# Patient Record
Sex: Male | Born: 1950 | Race: White | Hispanic: No | Marital: Married | Smoking: Never smoker
Health system: Southern US, Community
[De-identification: ages and names within clinical notes are randomized; demographics above are authoritative.]

## PROBLEM LIST (undated history)

## (undated) DIAGNOSIS — Z9189 Other specified personal risk factors, not elsewhere classified: Secondary | ICD-10-CM

## (undated) DIAGNOSIS — I5042 Chronic combined systolic (congestive) and diastolic (congestive) heart failure: Secondary | ICD-10-CM

## (undated) DIAGNOSIS — I10 Essential (primary) hypertension: Secondary | ICD-10-CM

## (undated) DIAGNOSIS — K219 Gastro-esophageal reflux disease without esophagitis: Secondary | ICD-10-CM

## (undated) DIAGNOSIS — I359 Nonrheumatic aortic valve disorder, unspecified: Secondary | ICD-10-CM

## (undated) DIAGNOSIS — I420 Dilated cardiomyopathy: Secondary | ICD-10-CM

## (undated) DIAGNOSIS — M87051 Idiopathic aseptic necrosis of right femur: Secondary | ICD-10-CM

## (undated) DIAGNOSIS — I77810 Thoracic aortic ectasia: Secondary | ICD-10-CM

## (undated) HISTORY — DX: Dilated cardiomyopathy: I42.0

## (undated) HISTORY — DX: Thoracic aortic ectasia: I77.810

## (undated) HISTORY — DX: Idiopathic aseptic necrosis of right femur: M87.051

## (undated) HISTORY — DX: Other specified personal risk factors, not elsewhere classified: Z91.89

## (undated) HISTORY — DX: Chronic combined systolic (congestive) and diastolic (congestive) heart failure: I50.42

## (undated) HISTORY — DX: Nonrheumatic aortic valve disorder, unspecified: I35.9

## (undated) HISTORY — DX: Essential (primary) hypertension: I10

---

## 1999-08-23 ENCOUNTER — Emergency Department (HOSPITAL_COMMUNITY): Admission: EM | Admit: 1999-08-23 | Discharge: 1999-08-23 | Payer: Self-pay | Admitting: Emergency Medicine

## 1999-11-17 ENCOUNTER — Encounter: Payer: Self-pay | Admitting: Internal Medicine

## 1999-11-17 ENCOUNTER — Inpatient Hospital Stay (HOSPITAL_COMMUNITY): Admission: EM | Admit: 1999-11-17 | Discharge: 1999-12-03 | Payer: Self-pay | Admitting: Emergency Medicine

## 1999-11-19 ENCOUNTER — Encounter: Payer: Self-pay | Admitting: Internal Medicine

## 1999-11-20 ENCOUNTER — Encounter: Payer: Self-pay | Admitting: Internal Medicine

## 1999-11-20 HISTORY — PX: ENDOVASCULAR REPAIR OF POPLITEAL ARTERY ANEURYSM: SHX5811

## 1999-11-23 ENCOUNTER — Encounter: Payer: Self-pay | Admitting: Internal Medicine

## 1999-12-17 ENCOUNTER — Inpatient Hospital Stay (HOSPITAL_COMMUNITY)
Admission: AD | Admit: 1999-12-17 | Discharge: 2000-01-11 | Payer: Self-pay | Admitting: Thoracic Surgery (Cardiothoracic Vascular Surgery)

## 1999-12-17 ENCOUNTER — Encounter: Payer: Self-pay | Admitting: Vascular Surgery

## 1999-12-20 HISTORY — PX: OTHER SURGICAL HISTORY: SHX169

## 1999-12-27 ENCOUNTER — Encounter: Payer: Self-pay | Admitting: Cardiology

## 2000-01-01 ENCOUNTER — Encounter: Payer: Self-pay | Admitting: Cardiothoracic Surgery

## 2000-01-03 ENCOUNTER — Encounter: Payer: Self-pay | Admitting: Thoracic Surgery (Cardiothoracic Vascular Surgery)

## 2000-01-04 ENCOUNTER — Encounter: Payer: Self-pay | Admitting: Thoracic Surgery (Cardiothoracic Vascular Surgery)

## 2000-01-05 ENCOUNTER — Encounter: Payer: Self-pay | Admitting: Thoracic Surgery (Cardiothoracic Vascular Surgery)

## 2000-01-06 ENCOUNTER — Encounter: Payer: Self-pay | Admitting: Thoracic Surgery (Cardiothoracic Vascular Surgery)

## 2003-01-29 ENCOUNTER — Ambulatory Visit (HOSPITAL_COMMUNITY)
Admission: RE | Admit: 2003-01-29 | Discharge: 2003-01-29 | Payer: Self-pay | Admitting: Thoracic Surgery (Cardiothoracic Vascular Surgery)

## 2004-02-05 ENCOUNTER — Ambulatory Visit (HOSPITAL_COMMUNITY)
Admission: RE | Admit: 2004-02-05 | Discharge: 2004-02-05 | Payer: Self-pay | Admitting: Thoracic Surgery (Cardiothoracic Vascular Surgery)

## 2005-03-02 ENCOUNTER — Ambulatory Visit (HOSPITAL_COMMUNITY)
Admission: RE | Admit: 2005-03-02 | Discharge: 2005-03-02 | Payer: Self-pay | Admitting: Thoracic Surgery (Cardiothoracic Vascular Surgery)

## 2013-02-12 ENCOUNTER — Other Ambulatory Visit: Payer: Self-pay | Admitting: Cardiology

## 2013-06-18 ENCOUNTER — Other Ambulatory Visit: Payer: Self-pay | Admitting: *Deleted

## 2013-06-18 MED ORDER — METOPROLOL TARTRATE 100 MG PO TABS: ORAL_TABLET | ORAL | Status: DC

## 2013-08-09 ENCOUNTER — Other Ambulatory Visit: Payer: Self-pay

## 2013-08-09 MED ORDER — METOPROLOL TARTRATE 100 MG PO TABS: ORAL_TABLET | ORAL | Status: DC

## 2013-09-03 ENCOUNTER — Encounter: Payer: Self-pay | Admitting: General Surgery

## 2013-09-03 DIAGNOSIS — Z954 Presence of other heart-valve replacement: Secondary | ICD-10-CM | POA: Insufficient documentation

## 2013-09-03 DIAGNOSIS — I359 Nonrheumatic aortic valve disorder, unspecified: Secondary | ICD-10-CM

## 2013-09-03 DIAGNOSIS — I10 Essential (primary) hypertension: Secondary | ICD-10-CM | POA: Insufficient documentation

## 2013-09-03 HISTORY — DX: Nonrheumatic aortic valve disorder, unspecified: I35.9

## 2013-09-11 ENCOUNTER — Encounter: Payer: Self-pay | Admitting: Cardiology

## 2013-09-11 ENCOUNTER — Ambulatory Visit (INDEPENDENT_AMBULATORY_CARE_PROVIDER_SITE_OTHER): Payer: BC Managed Care – PPO | Admitting: Cardiology

## 2013-09-11 VITALS — BP 130/58 | HR 64 | Ht 61.0 in | Wt 136.0 lb

## 2013-09-11 DIAGNOSIS — Z954 Presence of other heart-valve replacement: Secondary | ICD-10-CM

## 2013-09-11 DIAGNOSIS — I10 Essential (primary) hypertension: Secondary | ICD-10-CM

## 2013-09-11 DIAGNOSIS — I359 Nonrheumatic aortic valve disorder, unspecified: Secondary | ICD-10-CM

## 2013-09-11 NOTE — Progress Notes (Signed)
  [address], [address] [place], [place]  [address] Phone: [phone] Fax:  [phone]  Date:  09/11/2013   ID:  Dale Sanford, DOB 17-Oct-1950, MRN [id number]  PCP:  Mickie Hillier, MD  Cardiologist:  Armanda Magic, MD     History of Present Illness: Dale Sanford is a 63 y.o. 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  He denies any chest pain, SOB, DOE, LE edema, dizziness, palpitations or syncope.  He occasionally has some heartburn.     Wt Readings from Last 3 Encounters:  09/11/13 136 lb (61.689 kg)     Past Medical History  Diagnosis Date  . Endocarditis 11/1999    Aortic Valve w AI S/P AVR autograft w pulmonic tissue valve (Ross Procedure) 12/1999  . Poor dental hygiene   . Hypertension     Current Outpatient Prescriptions  Medication Sig Dispense Refill  . aspirin 325 MG tablet Take 325 mg by mouth daily.      . metoprolol (LOPRESSOR) 100 MG tablet TAKE 1 TABLET TWICE A DAY  180 tablet  0   No current facility-administered medications for this visit.    Allergies:    Allergies  Allergen Reactions  . Nyquil Multi-Symptom [Pseudoeph-Doxylamine-Dm-Apap]     Social History:  The patient  reports that he has never smoked. He does not have any smokeless tobacco history on file. He reports that he does not drink alcohol.   Family History:  The patient's family history is not on file.   ROS:  Please see the history of present illness.      All other systems reviewed and negative.   PHYSICAL EXAM: VS:  BP 130/58  Pulse 64  Ht 5\' 1"  (1.549 m)  Wt 136 lb (61.689 kg)  BMI 25.71 kg/m2 Well nourished, well developed, in no acute distress HEENT: normal Neck: no JVD Cardiac:  normal S1, S2; RRR; no murmur Lungs:  clear to auscultation bilaterally, no wheezing, rhonchi or rales Abd:  soft, nontender, no hepatomegaly Ext: no edema Skin: warm and dry Neuro:  CNs 2-12 intact, no focal abnormalities noted  EKG:   NSR a5 64bpm with ILBBB, LVH by voltage and T wave inversions in III and aVF, biphasic T wave in II - no change from EKG 09/12/2012  ASSESSMENT AND PLAN: 1.  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  HTN- controlled -  Continue metoprolol  Followup with me in 1 year   Signed, Armanda Magic, MD 09/11/2013 9:57 AM

## 2013-09-11 NOTE — Patient Instructions (Signed)
Your physician recommends that you continue on your current medications as directed. Please refer to the Current Medication list given to you today.  Your physician has requested that you have an echocardiogram. Echocardiography is a painless test that uses sound waves to create images of your heart. It provides your doctor with information about the size and shape of your heart and how well your heart's chambers and valves are working. This procedure takes approximately one hour. There are no restrictions for this procedure.  Your physician wants you to follow-up in: 1 year with Dr. Turner.  You will receive a reminder letter in the mail two months in advance. If you don't receive a letter, please call our office to schedule the follow-up appointment.  

## 2013-09-16 ENCOUNTER — Encounter: Payer: Self-pay | Admitting: Cardiology

## 2013-10-01 ENCOUNTER — Ambulatory Visit (HOSPITAL_COMMUNITY): Payer: BC Managed Care – PPO | Attending: Cardiology | Admitting: Radiology

## 2013-10-01 DIAGNOSIS — I079 Rheumatic tricuspid valve disease, unspecified: Secondary | ICD-10-CM | POA: Insufficient documentation

## 2013-10-01 DIAGNOSIS — I059 Rheumatic mitral valve disease, unspecified: Secondary | ICD-10-CM | POA: Insufficient documentation

## 2013-10-01 DIAGNOSIS — I38 Endocarditis, valve unspecified: Secondary | ICD-10-CM | POA: Insufficient documentation

## 2013-10-01 DIAGNOSIS — Z954 Presence of other heart-valve replacement: Secondary | ICD-10-CM

## 2013-10-01 DIAGNOSIS — I10 Essential (primary) hypertension: Secondary | ICD-10-CM | POA: Insufficient documentation

## 2013-10-01 DIAGNOSIS — I517 Cardiomegaly: Secondary | ICD-10-CM | POA: Insufficient documentation

## 2013-10-01 DIAGNOSIS — I359 Nonrheumatic aortic valve disorder, unspecified: Secondary | ICD-10-CM

## 2013-10-01 NOTE — Progress Notes (Signed)
Echocardiogram performed.  

## 2013-10-15 ENCOUNTER — Encounter: Payer: Self-pay | Admitting: General Surgery

## 2013-10-15 ENCOUNTER — Other Ambulatory Visit: Payer: Self-pay | Admitting: General Surgery

## 2013-10-15 DIAGNOSIS — I77810 Thoracic aortic ectasia: Secondary | ICD-10-CM

## 2013-12-25 ENCOUNTER — Other Ambulatory Visit: Payer: Self-pay | Admitting: Cardiology

## 2014-07-09 ENCOUNTER — Encounter: Payer: Self-pay | Admitting: Cardiology

## 2015-02-04 ENCOUNTER — Other Ambulatory Visit: Payer: Self-pay | Admitting: Cardiology

## 2015-04-27 ENCOUNTER — Emergency Department (HOSPITAL_COMMUNITY): Payer: BLUE CROSS/BLUE SHIELD

## 2015-04-27 ENCOUNTER — Emergency Department (HOSPITAL_COMMUNITY)
Admission: EM | Admit: 2015-04-27 | Discharge: 2015-04-28 | Disposition: A | Discharge status: Home / Self Care | Payer: BLUE CROSS/BLUE SHIELD | Attending: Emergency Medicine | Admitting: Emergency Medicine

## 2015-04-27 ENCOUNTER — Encounter (HOSPITAL_COMMUNITY): Payer: Self-pay | Admitting: Cardiology

## 2015-04-27 DIAGNOSIS — Z79899 Other long term (current) drug therapy: Secondary | ICD-10-CM | POA: Diagnosis not present

## 2015-04-27 DIAGNOSIS — I10 Essential (primary) hypertension: Secondary | ICD-10-CM | POA: Insufficient documentation

## 2015-04-27 DIAGNOSIS — Z7982 Long term (current) use of aspirin: Secondary | ICD-10-CM | POA: Insufficient documentation

## 2015-04-27 DIAGNOSIS — R112 Nausea with vomiting, unspecified: Secondary | ICD-10-CM | POA: Diagnosis present

## 2015-04-27 DIAGNOSIS — R109 Unspecified abdominal pain: Secondary | ICD-10-CM | POA: Insufficient documentation

## 2015-04-27 DIAGNOSIS — R197 Diarrhea, unspecified: Secondary | ICD-10-CM | POA: Insufficient documentation

## 2015-04-27 LAB — COMPREHENSIVE METABOLIC PANEL
ALBUMIN: 4.4 g/dL (ref 3.5–5.0)
ALK PHOS: 66 U/L (ref 38–126)
ALT: 16 U/L — AB (ref 17–63)
AST: 18 U/L (ref 15–41)
Anion gap: 15 (ref 5–15)
BUN: 17 mg/dL (ref 6–20)
CALCIUM: 9 mg/dL (ref 8.9–10.3)
CO2: 26 mmol/L (ref 22–32)
Chloride: 100 mmol/L — ABNORMAL LOW (ref 101–111)
Creatinine, Ser: 1.43 mg/dL — ABNORMAL HIGH (ref 0.61–1.24)
GFR calc Af Amer: 57 mL/min — ABNORMAL LOW (ref >60)
GFR calc non Af Amer: 50 mL/min — ABNORMAL LOW (ref >60)
GLUCOSE: 176 mg/dL — AB (ref 65–99)
Potassium: 5 mmol/L (ref 3.5–5.1)
Sodium: 141 mmol/L (ref 135–145)
Total Bilirubin: 0.9 mg/dL (ref 0.3–1.2)
Total Protein: 7.3 g/dL (ref 6.5–8.1)

## 2015-04-27 LAB — CBC
HCT: 48.4 % (ref 39.0–52.0)
Hemoglobin: 16.6 g/dL (ref 13.0–17.0)
MCH: 28.6 pg (ref 26.0–34.0)
MCHC: 34.3 g/dL (ref 30.0–36.0)
MCV: 83.4 fL (ref 78.0–100.0)
PLATELETS: 275 10*3/uL (ref 150–400)
RBC: 5.8 MIL/uL (ref 4.22–5.81)
RDW: 12.7 % (ref 11.5–15.5)
WBC: 21.6 10*3/uL — AB (ref 4.0–10.5)

## 2015-04-27 LAB — DIFFERENTIAL
BASOS ABS: 0 10*3/uL (ref 0.0–0.1)
BASOS PCT: 0 %
EOS ABS: 0 10*3/uL (ref 0.0–0.7)
Eosinophils Relative: 0 %
Lymphocytes Relative: 8 %
Lymphs Abs: 1.2 10*3/uL (ref 0.7–4.0)
MONO ABS: 0.7 10*3/uL (ref 0.1–1.0)
Monocytes Relative: 4 %
NEUTROS ABS: 14 10*3/uL — AB (ref 1.7–7.7)
Neutrophils Relative %: 88 %

## 2015-04-27 LAB — LIPASE, BLOOD: Lipase: 40 U/L (ref 11–51)

## 2015-04-27 MED ORDER — ONDANSETRON 4 MG PO TBDP
4.0000 mg | ORAL_TABLET | Freq: Once | ORAL | Status: AC
  Administered 2015-04-27: 4 mg via ORAL
  Filled 2015-04-27: qty 1

## 2015-04-27 MED ORDER — SODIUM CHLORIDE 0.9 % IV BOLUS (SEPSIS)
1000.0000 mL | Freq: Once | INTRAVENOUS | Status: AC
  Administered 2015-04-27: 1000 mL via INTRAVENOUS

## 2015-04-27 NOTE — ED Notes (Signed)
Pt he had a couple of episodes of vomiting last night. States he has had sick contacts at home.

## 2015-04-27 NOTE — ED Provider Notes (Signed)
CSN: [id number]     Arrival date & time 04/27/15  1340 History   First MD Initiated Contact with Patient 04/27/15 2056     Chief Complaint  Patient presents with  . Emesis     (Consider location/radiation/quality/duration/timing/severity/associated sxs/prior Treatment) Patient is a 65 y.o. male presenting with GI illness. The history is provided by the patient.  GI Problem This is a new problem. The current episode started yesterday. The problem occurs 2 to 4 times per day. The problem has been resolved. Associated symptoms include nausea and vomiting. Pertinent negatives include no abdominal pain, chest pain, chills, coughing, fever or urinary symptoms. Nothing aggravates the symptoms. He has tried nothing for the symptoms.    Past Medical History  Diagnosis Date  . Endocarditis 11/1999    Aortic Valve w AI S/P AVR autograft w pulmonic tissue valve (Ross Procedure) 12/1999  . Poor dental hygiene   . Hypertension    Past Surgical History  Procedure Laterality Date  . Descending aortic resesection, replacement  12/1999    Hemashield Graft  . 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 No pertinent family history. Social History  Substance Use Topics  . Smoking status: Never Smoker   . Smokeless tobacco: None  . Alcohol Use: No     Comment: quit in 2008    Review of Systems  Constitutional: Negative for fever and chills.  HENT: Negative.   Eyes: Negative for visual disturbance.  Respiratory: Negative for cough and shortness of breath.   Cardiovascular: Negative for chest pain.  Gastrointestinal: Positive for nausea, vomiting and diarrhea. Negative for abdominal pain.  Genitourinary: Negative.   Musculoskeletal: Negative.   Skin: Negative.   Neurological: Negative.       Allergies  Nyquil multi-symptom  Home Medications   Prior to Admission medications   Medication Sig Start Date End Date Taking? Authorizing  Provider  aspirin 325 MG tablet Take 325 mg by mouth daily.   Yes Historical Provider, MD  metoprolol (LOPRESSOR) 100 MG tablet TAKE 1 TABLET TWICE A DAY 12/26/13  Yes Quintella Reichert, MD  naproxen sodium (ANAPROX) 220 MG tablet Take 220 mg by mouth daily as needed (for back pain).   Yes Historical Provider, MD  diphenoxylate-atropine (LOMOTIL) 2.5-0.025 MG tablet Take 1 tablet by mouth 4 (four) times daily as needed for diarrhea or loose stools. 04/28/15   Gilda Crease, MD  ondansetron (ZOFRAN) 4 MG tablet Take 1 tablet (4 mg total) by mouth every 6 (six) hours. 04/28/15   Gilda Crease, MD   BP 142/65 mmHg  Pulse 92  Temp(Src) 99.4 F (37.4 C) (Oral)  Resp 20  SpO2 99% Physical Exam  Constitutional: He is oriented to person, place, and time. He appears well-developed and well-nourished. No distress.  HENT:  Head: Normocephalic and atraumatic.  Eyes: EOM are normal. Pupils are equal, round, and reactive to light.  Neck: Normal range of motion. Neck supple.  Cardiovascular: Normal rate, regular rhythm, normal heart sounds and intact distal pulses.   Pulmonary/Chest: Effort normal and breath sounds normal. No respiratory distress. He exhibits no tenderness.  Abdominal: Soft. He exhibits no distension. There is no tenderness. There is no rebound and no guarding.  Musculoskeletal: Normal range of motion. He exhibits no edema or tenderness.  Neurological: He is alert and oriented to person, place, and time. No cranial nerve deficit. He exhibits normal muscle tone. Coordination normal.  Skin: Skin is  warm and dry. No rash noted. He is not diaphoretic. No erythema. No pallor.  Psychiatric: He has a normal mood and affect.  Nursing note and vitals reviewed.   ED Course  Procedures (including critical care time) Labs Review Labs Reviewed  COMPREHENSIVE METABOLIC PANEL - Abnormal; Notable for the following:    Chloride 100 (*)    Glucose, Bld 176 (*)    Creatinine, Ser 1.43 (*)     ALT 16 (*)    GFR calc non Af Amer 50 (*)    GFR calc Af Amer 57 (*)    All other components within normal limits  CBC - Abnormal; Notable for the following:    WBC 21.6 (*)    All other components within normal limits  DIFFERENTIAL - Abnormal; Notable for the following:    Neutro Abs 14.0 (*)    All other components within normal limits  URINALYSIS, ROUTINE W REFLEX MICROSCOPIC (NOT AT [facility]) - Abnormal; Notable for the following:    Bilirubin Urine SMALL (*)    All other components within normal limits  LIPASE, BLOOD    Imaging Review Dg Chest 2 View  04/27/2015  CLINICAL DATA:  Acute onset of leukocytosis and vomiting. Initial encounter. EXAM: CHEST  2 VIEW COMPARISON:  None. FINDINGS: The lungs are well-aerated and clear. There is no evidence of focal opacification, pleural effusion or pneumothorax. The heart is normal in size; the patient is status post median sternotomy. No acute osseous abnormalities are seen. IMPRESSION: No acute cardiopulmonary process seen. Electronically Signed   By: Roanna Raider M.D.   On: 04/27/2015 23:40  I have personally reviewed and evaluated these images and lab results as part of my medical decision-making.   EKG Interpretation None      MDM   Final diagnoses:  Nausea and vomiting, vomiting of unspecified type    Pt is 64yoM here with 2-3 episodes of nbnb vomiting over the night and now with diarrhea.  Symptoms largely resolved at the time of evaluation today. Pt lives with step daughter and her two kids, both of which of GI illness. Symptoms started after eating pizza. No fever or blood in diarrhea. Further history and exam as above. VSS and benign abdomen. Found to have significant leukocytosis. Will obtain ct a/p. Symptomatically treated here. Lipase neg and cmp reassuring. ua neg  Pt care headed off to Dr. Blinda Leatherwood at 1:30am. Please f/u on ct a/p and re-evaluate pt.    Marijean Niemann, MD 04/28/15 4098  Richardean Canal, MD 04/28/15 [phone]

## 2015-04-27 NOTE — ED Notes (Signed)
Pt given water for PO challenge,

## 2015-04-28 ENCOUNTER — Emergency Department (HOSPITAL_COMMUNITY): Payer: BLUE CROSS/BLUE SHIELD

## 2015-04-28 LAB — URINALYSIS, ROUTINE W REFLEX MICROSCOPIC
GLUCOSE, UA: NEGATIVE mg/dL
HGB URINE DIPSTICK: NEGATIVE
Ketones, ur: NEGATIVE mg/dL
LEUKOCYTES UA: NEGATIVE
NITRITE: NEGATIVE
PH: 5 (ref 5.0–8.0)
Protein, ur: NEGATIVE mg/dL
SPECIFIC GRAVITY, URINE: 1.02 (ref 1.005–1.030)

## 2015-04-28 MED ORDER — IOHEXOL 300 MG/ML  SOLN
80.0000 mL | Freq: Once | INTRAMUSCULAR | Status: AC | PRN
  Administered 2015-04-28: 100 mL via INTRAVENOUS

## 2015-04-28 MED ORDER — DIPHENOXYLATE-ATROPINE 2.5-0.025 MG PO TABS: 1.0000 | ORAL_TABLET | Freq: Four times a day (QID) | ORAL | Status: DC | PRN

## 2015-04-28 MED ORDER — ONDANSETRON HCL 4 MG PO TABS: 4.0000 mg | ORAL_TABLET | Freq: Four times a day (QID) | ORAL | Status: DC

## 2015-04-28 NOTE — Discharge Instructions (Signed)

## 2015-04-28 NOTE — ED Provider Notes (Signed)
Patient signed out to me by Dr. Silverio Lay follow-up on CT scan. Patient had been seen for nausea and vomiting. Based on his leukocytosis, CT scan was performed to evaluate for pathology. CT scan does not show any acute abnormality. Patient continues to do well, no symptoms. Will discharge with symptomatic treatment.  Results for orders placed or performed during the hospital encounter of 04/27/15  Lipase, blood  Result Value Ref Range   Lipase 40 11 - 51 U/L  Comprehensive metabolic panel  Result Value Ref Range   Sodium 141 135 - 145 mmol/L   Potassium 5.0 3.5 - 5.1 mmol/L   Chloride 100 (L) 101 - 111 mmol/L   CO2 26 22 - 32 mmol/L   Glucose, Bld 176 (H) 65 - 99 mg/dL   BUN 17 6 - 20 mg/dL   Creatinine, Ser 2.53 (H) 0.61 - 1.24 mg/dL   Calcium 9.0 8.9 - 66.4 mg/dL   Total Protein 7.3 6.5 - 8.1 g/dL   Albumin 4.4 3.5 - 5.0 g/dL   AST 18 15 - 41 U/L   ALT 16 (L) 17 - 63 U/L   Alkaline Phosphatase 66 38 - 126 U/L   Total Bilirubin 0.9 0.3 - 1.2 mg/dL   GFR calc non Af Amer 50 (L) >60 mL/min   GFR calc Af Amer 57 (L) >60 mL/min   Anion gap 15 5 - 15  CBC  Result Value Ref Range   WBC 21.6 (H) 4.0 - 10.5 K/uL   RBC 5.80 4.22 - 5.81 MIL/uL   Hemoglobin 16.6 13.0 - 17.0 g/dL   HCT 40.3 47.4 - 25.9 %   MCV 83.4 78.0 - 100.0 fL   MCH 28.6 26.0 - 34.0 pg   MCHC 34.3 30.0 - 36.0 g/dL   RDW 56.3 87.5 - 64.3 %   Platelets 275 150 - 400 K/uL  Differential  Result Value Ref Range   Neutrophils Relative % 88 %   Neutro Abs 14.0 (H) 1.7 - 7.7 K/uL   Lymphocytes Relative 8 %   Lymphs Abs 1.2 0.7 - 4.0 K/uL   Monocytes Relative 4 %   Monocytes Absolute 0.7 0.1 - 1.0 K/uL   Eosinophils Relative 0 %   Eosinophils Absolute 0.0 0.0 - 0.7 K/uL   Basophils Relative 0 %   Basophils Absolute 0.0 0.0 - 0.1 K/uL  Urinalysis, Routine w reflex microscopic (not at [facility])  Result Value Ref Range   Color, Urine YELLOW YELLOW   APPearance CLEAR CLEAR   Specific Gravity, Urine 1.020 1.005 - 1.030   pH 5.0  5.0 - 8.0   Glucose, UA NEGATIVE NEGATIVE mg/dL   Hgb urine dipstick NEGATIVE NEGATIVE   Bilirubin Urine SMALL (A) NEGATIVE   Ketones, ur NEGATIVE NEGATIVE mg/dL   Protein, ur NEGATIVE NEGATIVE mg/dL   Nitrite NEGATIVE NEGATIVE   Leukocytes, UA NEGATIVE NEGATIVE   Dg Chest 2 View  04/27/2015  CLINICAL DATA:  Acute onset of leukocytosis and vomiting. Initial encounter. EXAM: CHEST  2 VIEW COMPARISON:  None. FINDINGS: The lungs are well-aerated and clear. There is no evidence of focal opacification, pleural effusion or pneumothorax. The heart is normal in size; the patient is status post median sternotomy. No acute osseous abnormalities are seen. IMPRESSION: No acute cardiopulmonary process seen. Electronically Signed   By: Roanna Raider M.D.   On: 04/27/2015 23:40   Ct Abdomen Pelvis W Contrast  04/28/2015  CLINICAL DATA:  Acute onset of vomiting.  Initial encounter. EXAM: CT ABDOMEN  AND PELVIS WITH CONTRAST TECHNIQUE: Multidetector CT imaging of the abdomen and pelvis was performed using the standard protocol following bolus administration of intravenous contrast. CONTRAST:  OMNIPAQUE IOHEXOL 300 MG/ML  SOLN COMPARISON:  None. FINDINGS: The visualized lung bases are clear. The patient is status post median sternotomy. A 2.5 cm hypodensity within the left hepatic lobe likely reflects a cyst. The liver and spleen are otherwise unremarkable. Stones are noted dependently within the gallbladder. The gallbladder is otherwise unremarkable. The pancreas and adrenal glands are unremarkable. An 8 mm cyst is noted at the upper pole of the right kidney. The kidneys are otherwise unremarkable. There is no evidence of hydronephrosis. No renal or ureteral stones are seen. No perinephric stranding is appreciated. No free fluid is identified. The small bowel is unremarkable in appearance. The stomach is within normal limits. No acute vascular abnormalities are seen. Mild scattered calcification is noted along the  abdominal aorta and its branches. The appendix is normal in caliber, without evidence of appendicitis. The colon is partially filled, and is unremarkable in appearance. The bladder is mildly distended and grossly unremarkable. The prostate is enlarged, measuring 5.2 cm in transverse dimension. No inguinal lymphadenopathy is seen. No acute osseous abnormalities are identified. Degenerative change is noted at the right hip joint, with joint space narrowing, sclerosis and subcortical cystic change along the weight-bearing articulation of the right femoral head and acetabulum. A chronic compression deformity is noted at L2. IMPRESSION: 1. No acute abnormality seen to explain the patient's symptoms. 2. Likely hepatic cyst and right renal cyst noted. 3. Mild scattered calcification along the abdominal aorta and its branches. 4. Enlarged prostate noted. 5. Degenerative change at the right hip, and chronic compression deformity at L2. Electronically Signed   By: Roanna Raider M.D.   On: 04/28/2015 00:46      Gilda Crease, MD 04/28/15 0200

## 2015-09-23 DIAGNOSIS — M1611 Unilateral primary osteoarthritis, right hip: Secondary | ICD-10-CM | POA: Diagnosis not present

## 2015-10-20 ENCOUNTER — Telehealth: Payer: Self-pay

## 2015-10-20 NOTE — Telephone Encounter (Signed)
Received clearance request from [facility] for R Hip: Right THA-DA. Surgery date pending clearance. Clearance to be faxed to Halford Decamp at [phone].  Called patient to schedule OV since he has not been seen since 2015. Left message to call to schedule appointment.

## 2015-10-23 NOTE — Telephone Encounter (Signed)
After receiving 2nd surgical clearance request, left message for Jacki Cones (surgical scheduler) that patient will need to be evaluated prior to clearance. Instructed her to have him call [facility] to schedule if she talks to him.

## 2015-10-28 NOTE — Telephone Encounter (Signed)
Left message for patient to call and schedule OV for surgical clearance evaluation.

## 2015-11-04 NOTE — Telephone Encounter (Signed)
Letter sent to patient to contact the office and schedule follow-up appointment for cardiac clearance.

## 2015-11-13 ENCOUNTER — Encounter: Payer: Self-pay | Admitting: Physician Assistant

## 2015-11-15 NOTE — Progress Notes (Signed)
Cardiology Office Note    Date:  11/16/2015   ID:  Dale Sanford, DOB 14-Jan-1951, MRN [id number]  PCP:  Clayborn Heron, MD  Cardiologist:  Dr. Mayford Knife   CC: pre op clearance prior to hip replacement   History of Present Illness:  Dale Sanford is a 65 y.o. 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  He was last seen by Dr. Mayford Knife in 08/2013 for cardiology evaluation. 2D ECHO was ordered 09/2013 which showed mildly reduced LVF with stable AVR with AR and mildly dilated Ascending aorta. Plan was for repeat echo in 1 year for dilated aorta but this was never completed.   He was seen in ER in 04/2015 for n/v. Creat was noted to be 1.45.   Today he presents to clinic for follow up. No CP or SOB. He is very active walking everyday. 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 He never gets chest pain but does sometimes get some SOB with activity like using his push mover. No LE edema, orthopnea or PND. No dizziness or syncope. No blood in stool or urine.    Past Medical History:  Diagnosis Date  . 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 Hypertension   . Poor dental hygiene     Past Surgical History:  Procedure Laterality Date  . DESCENDING AORTIC resesection, replacement  12/1999   Hemashield Graft  . 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 aspirin 325 MG tablet Take 325 mg by mouth daily.    . diphenoxylate-atropine (LOMOTIL) 2.5-0.025 MG tablet Take 1 tablet by mouth 4 (four) times daily as needed for diarrhea or loose stools. 30 tablet 0  . metoprolol (LOPRESSOR) 100 MG tablet TAKE 1 TABLET TWICE A  DAY 180 tablet 3  . naproxen sodium (ANAPROX) 220 MG tablet Take 220 mg by mouth daily as needed (for back pain).    . ondansetron (ZOFRAN) 4 MG tablet Take 1 tablet (4 mg total) by mouth every 6 (six) hours. 12 tablet 0   No facility-administered medications prior to visit.      Allergies:   Nyquil multi-symptom [[id number]-[id number]]   Social History   Social History  . Marital status: Married    Spouse name: N/A  . Number of children: N/A  . Years of education: N/A   Social History Main Topics  . Smoking status: Never Smoker  . Smokeless tobacco: Never Used  . Alcohol use No     Comment: quit in 2008  . Drug use: Unknown  . Sexual activity: Not Asked   Other Topics Concern  . None   Social History Narrative  . None     Family History:  The patient's family hx is remarkable for  Father passed away of heart disease. Mother is still alive. She has had a CVA.   ROS:   Please see the history of present illness.    ROS All other systems reviewed and are negative.   PHYSICAL EXAM:   VS:  BP (!) 174/72   Pulse 63   Ht 5\' 1"  (1.549 m)   Wt 139 lb  12.8 oz (63.4 kg)   BMI 26.41 kg/m    GEN: Well nourished, well developed, in no acute distress  HEENT: normal  Neck: no JVD, carotid bruits, or masses Cardiac: RRR; + murmur, rubs, or gallops,no edema  Respiratory:  clear to auscultation bilaterally, normal work of breathing GI: soft, nontender, nondistended, + BS MS: no deformity or atrophy  Skin: warm and dry, no rash Neuro:  Alert and Oriented x 3, Strength and sensation are intact Psych: euthymic mood, full affect  Wt Readings from Last 3 Encounters:  11/16/15 139 lb 12.8 oz (63.4 kg)  09/11/13 136 lb (61.7 kg)      Studies/Labs Reviewed:   EKG:  EKG is ordered today.  The ekg ordered today demonstrates NSR, ILBBB, LVH, TWI in II, III and AVF and V3-V6, prolonged QT  Recent Labs: 04/27/2015: ALT 16; BUN 17; Creatinine, Ser 1.43; Hemoglobin 16.6;  Platelets 275; Potassium 5.0; Sodium 141   Lipid Panel No results found for: CHOL, TRIG, HDL, CHOLHDL, VLDL, LDLCALC, LDLDIRECT  Additional studies/ records that were reviewed today include:  2D ECHO: 10/01/2013 LV EF: 45% -  50% Study Conclusions - Left ventricle: The cavity size was normal. Systolic function was mildly reduced. The estimated ejection fraction was in the range of 45% to 50%. There is akinesis of the basal-midinferior myocardium. - Aortic valve: A bioprosthesis was present. There was mild regurgitation. - Aorta: Aortic root dimension: 38 mm (ED). - Ascending aorta: The ascending aorta was mildly dilated. - Mitral valve: There was mild regurgitation. - Left atrium: The atrium was mildly dilated.   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 He is overdue for repeat echo. I will have this arranged.   HTN: BP is elevated today on metoprolol tart 100mg  BID. Will add amlodipine 5mg  daily.   Abnormal ECG: TWI in inferior and anterolateral leads which could be related to his HTN. Will do lexiscan myoview prior to clearing him for his hip surgery   Medication Adjustments/Labs and Tests Ordered: Current medicines are reviewed at length with the patient today.  Concerns regarding medicines are outlined above.  Medication changes, Labs and Tests ordered today are listed in the Patient Instructions below. Patient Instructions  Medication Instructions:  Your physician has recommended you make the following change in your medication:  1.  START Amlodipine 5 mg taking 1 tablet daily  Labwork: None ordered  Testing/Procedures: Your physician has requested that you have an echocardiogram. Echocardiography is a painless test that uses sound waves to create images of your heart. It provides your doctor  with information about the size and shape of your heart and how well your heart's chambers and valves are working. This procedure takes approximately one hour. There are no restrictions for this procedure.  Your physician has requested that you have a lexiscan myoview. For further information please visit [email or web address] Please follow instruction sheet, as given.   Follow-Up: Your physician recommends that you schedule a follow-up appointment in: DEPENDING ON TEST RESULTS   Any Other Special Instructions Will Be Listed Below (If Applicable). Echocardiogram An echocardiogram, or echocardiography, uses sound waves (ultrasound) to produce an image of your heart. The echocardiogram is simple, painless, obtained within a short period of time, and offers valuable information to your health care provider. The images from an echocardiogram can provide information  such as:  Evidence of coronary artery disease (CAD).  Heart size.  Heart muscle function.  Heart valve function.  Aneurysm detection.  Evidence of a past heart attack.  Fluid buildup around the heart.  Heart muscle thickening.  Assess heart valve function. LET [facility] PROVIDER KNOW ABOUT:  Any allergies you have.  All medicines you are taking, including vitamins, herbs, eye drops, creams, and over-the-counter medicines.  Previous problems you or members of your family have had with the use of anesthetics.  Any blood disorders you have.  Previous surgeries you have had.  Medical conditions you have.  Possibility of pregnancy, if this applies. BEFORE THE PROCEDURE  No special preparation is needed. Eat and drink normally.  PROCEDURE   In order to produce an image of your heart, gel will be applied to your chest and a wand-like tool (transducer) will be moved over your chest. The gel will help transmit the sound waves from the transducer. The sound waves will harmlessly bounce off your heart to allow the  heart images to be captured in real-time motion. These images will then be recorded.  You may need an IV to receive a medicine that improves the quality of the pictures. AFTER THE PROCEDURE You may return to your normal schedule including diet, activities, and medicines, unless your health care provider tells you otherwise.   This information is not intended to replace advice given to you by your health care provider. Make sure you discuss any questions you have with your health care provider.   Document Released: 03/04/2000 Document Revised: 03/28/2014 Document Reviewed: 11/12/2012 Elsevier Interactive Patient Education 2016 Elsevier Inc.   Pharmacologic Stress Electrocardiogram A pharmacologic stress electrocardiogram is a heart (cardiac) test that uses nuclear imaging to evaluate the blood supply to your heart. This test may also be called a pharmacologic stress electrocardiography. Pharmacologic means that a medicine is used to increase your heart rate and blood pressure.  This stress test is done to find areas of poor blood flow to the heart by determining the extent of coronary artery disease (CAD). Some people exercise on a treadmill, which naturally increases the blood flow to the heart. For those people unable to exercise on a treadmill, a medicine is used. This medicine stimulates your heart and will cause your heart to beat harder and more quickly, as if you were exercising.  Pharmacologic stress tests can help determine: The adequacy of blood flow to your heart during increased levels of activity in order to clear you for discharge home. The extent of coronary artery blockage caused by CAD. Your prognosis if you have suffered a heart attack. The effectiveness of cardiac procedures done, such as an angioplasty, which can increase the circulation in your coronary arteries. Causes of chest pain or pressure. LET [facility] PROVIDER KNOW ABOUT: Any allergies you have. All  medicines you are taking, including vitamins, herbs, eye drops, creams, and over-the-counter medicines. Previous problems you or members of your family have had with the use of anesthetics. Any blood disorders you have. Previous surgeries you have had. Medical conditions you have. Possibility of pregnancy, if this applies. If you are currently breastfeeding. RISKS AND COMPLICATIONS Generally, this is a safe procedure. However, as with any procedure, complications can occur. Possible complications include: You develop pain or pressure in the following areas: Chest. Jaw or neck. Between your shoulder blades. Radiating down your left arm. Headache. Dizziness or light-headedness. Shortness of breath. Increased or irregular heartbeat. Low blood  pressure. Nausea or vomiting. Flushing. Redness going up the arm and slight pain during injection of medicine. Heart attack (rare). BEFORE THE PROCEDURE  Avoid all forms of caffeine for 24 hours before your test or as directed by your health care provider. This includes coffee, tea (even decaffeinated tea), caffeinated sodas, chocolate, cocoa, and certain pain medicines. Follow your health care provider's instructions regarding eating and drinking before the test. Take your medicines as directed at regular times with water unless instructed otherwise. Exceptions may include: If you have diabetes, ask how you are to take your insulin or pills. It is common to adjust insulin dosing the morning of the test. If you are taking beta-blocker medicines, it is important to talk to your health care provider about these medicines well before the date of your test. Taking beta-blocker medicines may interfere with the test. In some cases, these medicines need to be changed or stopped 24 hours or more before the test. If you wear a nitroglycerin patch, it may need to be removed prior to the test. Ask your health care provider if the patch should be removed before  the test. If you use an inhaler for any breathing condition, bring it with you to the test. If you are an outpatient, bring a snack so you can eat right after the stress phase of the test. Do not smoke for 4 hours prior to the test or as directed by your health care provider. Do not apply lotions, powders, creams, or oils on your chest prior to the test. Wear comfortable shoes and clothing. Let your health care provider know if you were unable to complete or follow the preparations for your test. PROCEDURE  Multiple patches (electrodes) will be put on your chest. If needed, small areas of your chest may be shaved to get better contact with the electrodes. Once the electrodes are attached to your body, multiple wires will be attached to the electrodes, and your heart rate will be monitored. An IV access will be started. A nuclear trace (isotope) is given. The isotope may be given intravenously, or it may be swallowed. Nuclear refers to several types of radioactive isotopes, and the nuclear isotope lights up the arteries so that the nuclear images are clear. The isotope is absorbed by your body. This results in low radiation exposure. A resting nuclear image is taken to show how your heart functions at rest. A medicine is given through the IV access. A second scan is done about 1 hour after the medicine injection and determines how your heart functions under stress. During this stress phase, you will be connected to an electrocardiogram machine. Your blood pressure and oxygen levels will be monitored. AFTER THE PROCEDURE  Your heart rate and blood pressure will be monitored after the test. You may return to your normal schedule, including diet,activities, and medicines, unless your health care provider tells you otherwise.   This information is not intended to replace advice given to you by your health care provider. Make sure you discuss any questions you have with your health care provider.     Document Released: 07/24/2008 Document Revised: 03/12/2013 Document Reviewed: 11/12/2012 Elsevier Interactive Patient Education [employer]     If you need a refill on your cardiac medications before your next appointment, please call your pharmacy.      Signed, Cline Crock, PA-C  11/16/2015 3:14 PM    [facility] [address] [place], [place], [place]  [address] Phone: [phone])  [phone]; Fax: [phone]    

## 2015-11-16 ENCOUNTER — Encounter: Payer: Self-pay | Admitting: Physician Assistant

## 2015-11-16 ENCOUNTER — Ambulatory Visit (INDEPENDENT_AMBULATORY_CARE_PROVIDER_SITE_OTHER): Payer: BLUE CROSS/BLUE SHIELD | Admitting: Physician Assistant

## 2015-11-16 VITALS — BP 174/72 | HR 63 | Ht 61.0 in | Wt 139.8 lb

## 2015-11-16 DIAGNOSIS — Z954 Presence of other heart-valve replacement: Secondary | ICD-10-CM | POA: Diagnosis not present

## 2015-11-16 DIAGNOSIS — R0609 Other forms of dyspnea: Secondary | ICD-10-CM

## 2015-11-16 DIAGNOSIS — M25551 Pain in right hip: Secondary | ICD-10-CM | POA: Diagnosis not present

## 2015-11-16 DIAGNOSIS — Z01818 Encounter for other preprocedural examination: Secondary | ICD-10-CM | POA: Diagnosis not present

## 2015-11-16 DIAGNOSIS — S91139A Puncture wound without foreign body of unspecified toe(s) without damage to nail, initial encounter: Secondary | ICD-10-CM | POA: Diagnosis not present

## 2015-11-16 DIAGNOSIS — R0600 Dyspnea, unspecified: Secondary | ICD-10-CM

## 2015-11-16 DIAGNOSIS — Z952 Presence of prosthetic heart valve: Secondary | ICD-10-CM | POA: Diagnosis not present

## 2015-11-16 DIAGNOSIS — I10 Essential (primary) hypertension: Secondary | ICD-10-CM | POA: Diagnosis not present

## 2015-11-16 DIAGNOSIS — Z8679 Personal history of other diseases of the circulatory system: Secondary | ICD-10-CM

## 2015-11-16 DIAGNOSIS — R9431 Abnormal electrocardiogram [ECG] [EKG]: Secondary | ICD-10-CM | POA: Diagnosis not present

## 2015-11-16 MED ORDER — AMLODIPINE BESYLATE 5 MG PO TABS: 5.0000 mg | ORAL_TABLET | Freq: Every day | ORAL | 3 refills | Status: DC

## 2015-11-16 MED ORDER — AMLODIPINE BESYLATE 5 MG PO TABS: 5.0000 mg | ORAL_TABLET | Freq: Every day | ORAL | 0 refills | Status: DC

## 2015-11-16 MED ORDER — METOPROLOL TARTRATE 100 MG PO TABS: 100.0000 mg | ORAL_TABLET | Freq: Two times a day (BID) | ORAL | 1 refills | Status: DC

## 2015-11-16 NOTE — Patient Instructions (Addendum)
Medication Instructions:  Your physician has recommended you make the following change in your medication:  1.  START Amlodipine 5 mg taking 1 tablet daily  Labwork: None ordered  Testing/Procedures: Your physician has requested that you have an echocardiogram. Echocardiography is a painless test that uses sound waves to create images of your heart. It provides your doctor with information about the size and shape of your heart and how well your heart's chambers and valves are working. This procedure takes approximately one hour. There are no restrictions for this procedure.  Your physician has requested that you have a lexiscan myoview. For further information please visit [email or web address] Please follow instruction sheet, as given.   Follow-Up: Your physician recommends that you schedule a follow-up appointment in: DEPENDING ON TEST RESULTS   Any Other Special Instructions Will Be Listed Below (If Applicable). Echocardiogram An echocardiogram, or echocardiography, uses sound waves (ultrasound) to produce an image of your heart. The echocardiogram is simple, painless, obtained within a short period of time, and offers valuable information to your health care provider. The images from an echocardiogram can provide information such as:  Evidence of coronary artery disease (CAD).  Heart size.  Heart muscle function.  Heart valve function.  Aneurysm detection.  Evidence of a past heart attack.  Fluid buildup around the heart.  Heart muscle thickening.  Assess heart valve function. LET [facility] CARE PROVIDER KNOW ABOUT:  Any allergies you have.  All medicines you are taking, including vitamins, herbs, eye drops, creams, and over-the-counter medicines.  Previous problems you or members of your family have had with the use of anesthetics.  Any blood disorders you have.  Previous surgeries you have had.  Medical conditions you have.  Possibility of pregnancy, if  this applies. BEFORE THE PROCEDURE  No special preparation is needed. Eat and drink normally.  PROCEDURE   In order to produce an image of your heart, gel will be applied to your chest and a wand-like tool (transducer) will be moved over your chest. The gel will help transmit the sound waves from the transducer. The sound waves will harmlessly bounce off your heart to allow the heart images to be captured in real-time motion. These images will then be recorded.  You may need an IV to receive a medicine that improves the quality of the pictures. AFTER THE PROCEDURE You may return to your normal schedule including diet, activities, and medicines, unless your health care provider tells you otherwise.   This information is not intended to replace advice given to you by your health care provider. Make sure you discuss any questions you have with your health care provider.   Document Released: 03/04/2000 Document Revised: 03/28/2014 Document Reviewed: 11/12/2012 Elsevier Interactive Patient Education 2016 Elsevier Inc.   Pharmacologic Stress Electrocardiogram A pharmacologic stress electrocardiogram is a heart (cardiac) test that uses nuclear imaging to evaluate the blood supply to your heart. This test may also be called a pharmacologic stress electrocardiography. Pharmacologic means that a medicine is used to increase your heart rate and blood pressure.  This stress test is done to find areas of poor blood flow to the heart by determining the extent of coronary artery disease (CAD). Some people exercise on a treadmill, which naturally increases the blood flow to the heart. For those people unable to exercise on a treadmill, a medicine is used. This medicine stimulates your heart and will cause your heart to beat harder and more quickly, as if you were exercising.  Pharmacologic  stress tests can help determine: The adequacy of blood flow to your heart during increased levels of activity in order to  clear you for discharge home. The extent of coronary artery blockage caused by CAD. Your prognosis if you have suffered a heart attack. The effectiveness of cardiac procedures done, such as an angioplasty, which can increase the circulation in your coronary arteries. Causes of chest pain or pressure. LET [facility] CARE PROVIDER KNOW ABOUT: Any allergies you have. All medicines you are taking, including vitamins, herbs, eye drops, creams, and over-the-counter medicines. Previous problems you or members of your family have had with the use of anesthetics. Any blood disorders you have. Previous surgeries you have had. Medical conditions you have. Possibility of pregnancy, if this applies. If you are currently breastfeeding. RISKS AND COMPLICATIONS Generally, this is a safe procedure. However, as with any procedure, complications can occur. Possible complications include: You develop pain or pressure in the following areas: Chest. Jaw or neck. Between your shoulder blades. Radiating down your left arm. Headache. Dizziness or light-headedness. Shortness of breath. Increased or irregular heartbeat. Low blood pressure. Nausea or vomiting. Flushing. Redness going up the arm and slight pain during injection of medicine. Heart attack (rare). BEFORE THE PROCEDURE  Avoid all forms of caffeine for 24 hours before your test or as directed by your health care provider. This includes coffee, tea (even decaffeinated tea), caffeinated sodas, chocolate, cocoa, and certain pain medicines. Follow your health care provider's instructions regarding eating and drinking before the test. Take your medicines as directed at regular times with water unless instructed otherwise. Exceptions may include: If you have diabetes, ask how you are to take your insulin or pills. It is common to adjust insulin dosing the morning of the test. If you are taking beta-blocker medicines, it is important to talk to your  health care provider about these medicines well before the date of your test. Taking beta-blocker medicines may interfere with the test. In some cases, these medicines need to be changed or stopped 24 hours or more before the test. If you wear a nitroglycerin patch, it may need to be removed prior to the test. Ask your health care provider if the patch should be removed before the test. If you use an inhaler for any breathing condition, bring it with you to the test. If you are an outpatient, bring a snack so you can eat right after the stress phase of the test. Do not smoke for 4 hours prior to the test or as directed by your health care provider. Do not apply lotions, powders, creams, or oils on your chest prior to the test. Wear comfortable shoes and clothing. Let your health care provider know if you were unable to complete or follow the preparations for your test. PROCEDURE  Multiple patches (electrodes) will be put on your chest. If needed, small areas of your chest may be shaved to get better contact with the electrodes. Once the electrodes are attached to your body, multiple wires will be attached to the electrodes, and your heart rate will be monitored. An IV access will be started. A nuclear trace (isotope) is given. The isotope may be given intravenously, or it may be swallowed. Nuclear refers to several types of radioactive isotopes, and the nuclear isotope lights up the arteries so that the nuclear images are clear. The isotope is absorbed by your body. This results in low radiation exposure. A resting nuclear image is taken to show how your heart  functions at rest. A medicine is given through the IV access. A second scan is done about 1 hour after the medicine injection and determines how your heart functions under stress. During this stress phase, you will be connected to an electrocardiogram machine. Your blood pressure and oxygen levels will be monitored. AFTER THE PROCEDURE  Your  heart rate and blood pressure will be monitored after the test. You may return to your normal schedule, including diet,activities, and medicines, unless your health care provider tells you otherwise.   This information is not intended to replace advice given to you by your health care provider. Make sure you discuss any questions you have with your health care provider.   Document Released: 07/24/2008 Document Revised: 03/12/2013 Document Reviewed: 11/12/2012 Elsevier Interactive Patient Education [employer] Inc[employer]     If you need a refill on your cardiac medications before your next appointment, please call your pharmacy.

## 2015-11-30 ENCOUNTER — Ambulatory Visit (INDEPENDENT_AMBULATORY_CARE_PROVIDER_SITE_OTHER): Payer: BLUE CROSS/BLUE SHIELD | Admitting: Podiatry

## 2015-11-30 ENCOUNTER — Encounter: Payer: Self-pay | Admitting: Podiatry

## 2015-11-30 VITALS — BP 158/70 | HR 64 | Wt 139.0 lb

## 2015-11-30 DIAGNOSIS — M79676 Pain in unspecified toe(s): Secondary | ICD-10-CM | POA: Diagnosis not present

## 2015-11-30 DIAGNOSIS — B351 Tinea unguium: Secondary | ICD-10-CM | POA: Diagnosis not present

## 2015-11-30 DIAGNOSIS — M79609 Pain in unspecified limb: Secondary | ICD-10-CM

## 2015-11-30 NOTE — Progress Notes (Signed)
   Subjective:    Patient ID: Dale Sanford, male    DOB: [birth year], 65 y.o.   MRN: [id number]  HPI    Review of Systems  Musculoskeletal: Positive for back pain.  Hematological: Bruises/bleeds easily.       Objective:   Physical Exam        Assessment & Plan:

## 2015-11-30 NOTE — Progress Notes (Signed)
Patient ID: Dale Sanford, male   DOB: January 04, 1951, 65 y.o.   MRN: [id number] SUBJECTIVE Patient with a history of hypertension presents to office today complaining of elongated, thickened nails. Pain while ambulating in shoes. Patient is unable to trim their own nails.   Allergies  Allergen Reactions  . Nyquil Multi-Symptom [Pseudoeph-Doxylamine-Dm-Apap] Itching and Rash    OBJECTIVE General Patient is awake, alert, and oriented x 3 and in no acute distress. Derm Skin is dry and supple bilateral. Negative open lesions or macerations. Remaining integument unremarkable. Nails are tender, long, thickened and dystrophic with subungual debris, consistent with onychomycosis, 1-5 bilateral. No signs of infection noted. Vasc  DP and PT pedal pulses palpable bilaterally. Temperature gradient within normal limits.  Neuro Epicritic and protective threshold sensation diminished bilaterally.  Musculoskeletal Exam No symptomatic pedal deformities noted bilateral. Muscular strength within normal limits.  ASSESSMENT 1. Onychomycosis of nail due to dermatophyte bilateral 2. Dermatophytosis of nail with pain 3. Pain in foot bilateral  PLAN OF CARE Patient evaluated today. Instructed to maintain good pedal hygiene and foot care. Stressed importance of maintaining healthy nail care.  Mechanical debridement of nails 1-5 bilaterally performed using a nail nipper. Filed with dremel without incident.  All patient questions were answered. Return to clinic when necessary   Felecia Shelling, DPM

## 2015-11-30 NOTE — Patient Instructions (Signed)

## 2015-12-01 ENCOUNTER — Other Ambulatory Visit: Payer: Self-pay

## 2015-12-01 ENCOUNTER — Ambulatory Visit (HOSPITAL_COMMUNITY): Payer: BLUE CROSS/BLUE SHIELD | Attending: Cardiology

## 2015-12-01 ENCOUNTER — Ambulatory Visit (HOSPITAL_BASED_OUTPATIENT_CLINIC_OR_DEPARTMENT_OTHER): Payer: BLUE CROSS/BLUE SHIELD

## 2015-12-01 DIAGNOSIS — Z954 Presence of other heart-valve replacement: Secondary | ICD-10-CM | POA: Diagnosis not present

## 2015-12-01 DIAGNOSIS — Z953 Presence of xenogenic heart valve: Secondary | ICD-10-CM | POA: Diagnosis not present

## 2015-12-01 DIAGNOSIS — R0600 Dyspnea, unspecified: Secondary | ICD-10-CM | POA: Diagnosis not present

## 2015-12-01 DIAGNOSIS — I071 Rheumatic tricuspid insufficiency: Secondary | ICD-10-CM | POA: Diagnosis not present

## 2015-12-01 DIAGNOSIS — Z01818 Encounter for other preprocedural examination: Secondary | ICD-10-CM

## 2015-12-01 DIAGNOSIS — I351 Nonrheumatic aortic (valve) insufficiency: Secondary | ICD-10-CM | POA: Diagnosis not present

## 2015-12-01 DIAGNOSIS — R0609 Other forms of dyspnea: Secondary | ICD-10-CM

## 2015-12-01 DIAGNOSIS — I77810 Thoracic aortic ectasia: Secondary | ICD-10-CM | POA: Diagnosis not present

## 2015-12-01 DIAGNOSIS — I340 Nonrheumatic mitral (valve) insufficiency: Secondary | ICD-10-CM | POA: Insufficient documentation

## 2015-12-01 LAB — MYOCARDIAL PERFUSION IMAGING
CHL CUP NUCLEAR SRS: 4
CHL CUP RESTING HR STRESS: 58 {beats}/min
LV dias vol: 146 mL (ref 62–150)
LVSYSVOL: 81 mL
NUC STRESS TID: 1.03
Peak HR: 74 {beats}/min
RATE: 0.33
SDS: 4
SSS: 8

## 2015-12-01 MED ORDER — REGADENOSON 0.4 MG/5ML IV SOLN
0.4000 mg | Freq: Once | INTRAVENOUS | Status: AC
  Administered 2015-12-01: 0.4 mg via INTRAVENOUS

## 2015-12-01 MED ORDER — TECHNETIUM TC 99M TETROFOSMIN IV KIT
10.4000 | PACK | Freq: Once | INTRAVENOUS | Status: AC | PRN
  Administered 2015-12-01: 10 via INTRAVENOUS
  Filled 2015-12-01: qty 10

## 2015-12-01 MED ORDER — TECHNETIUM TC 99M TETROFOSMIN IV KIT
31.6000 | PACK | Freq: Once | INTRAVENOUS | Status: AC | PRN
  Administered 2015-12-01: 32 via INTRAVENOUS
  Filled 2015-12-01: qty 32

## 2015-12-11 ENCOUNTER — Encounter: Payer: Self-pay | Admitting: *Deleted

## 2015-12-22 ENCOUNTER — Telehealth: Payer: Self-pay | Admitting: Cardiology

## 2015-12-22 NOTE — Telephone Encounter (Signed)
Notes Recorded by Elliot Cousin, RMA on 12/11/2015 at 2:12 PM EDT If pt calls, let him know clearance letter already faxed over for his cardiac clearance for his upcoming surgery   12/22/15:  Pt states he is requesting test results and if he has been cleared for surgery.   Discussed test results with patient and he is aware he has been cleared for surgery.

## 2015-12-22 NOTE — Telephone Encounter (Signed)
New message    Pt returning nurse call for his test results. Please call and leave a message.

## 2016-01-27 NOTE — Progress Notes (Signed)
Need orders for 11-30 surgery

## 2016-01-28 ENCOUNTER — Ambulatory Visit: Payer: Self-pay | Admitting: Orthopedic Surgery

## 2016-02-03 ENCOUNTER — Ambulatory Visit: Payer: Self-pay | Admitting: Orthopedic Surgery

## 2016-02-03 NOTE — H&P (Signed)
TOTAL HIP ADMISSION H&P  Patient is admitted for right total hip arthroplasty.  Subjective:  Chief Complaint: right hip pain  HPI: Dale Sanford, 65 y.o. male, has a history of pain and functional disability in the right hip(s) due to arthritis and patient has failed non-surgical conservative treatments for greater than 12 weeks to include NSAID's and/or analgesics, flexibility and strengthening excercises, use of assistive devices, weight reduction as appropriate and activity modification.  Onset of symptoms was gradual starting 5 years ago with gradually worsening course since that time.The patient noted no past surgery on the right hip(s).  Patient currently rates pain in the right hip at 10 out of 10 with activity. Patient has night pain, worsening of pain with activity and weight bearing, trendelenberg gait, pain that interfers with activities of daily living and pain with passive range of motion. Patient has evidence of subchondral cysts, subchondral sclerosis, periarticular osteophytes, joint subluxation and joint space narrowing by imaging studies. This condition presents safety issues increasing the risk of falls. There is no current active infection.  Patient Active Problem List   Diagnosis Date Noted  . Aortic valve disorders 09/03/2013  . 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 Unspecified essential hypertension 09/03/2013   Past Medical History:  Diagnosis Date  . 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 Hypertension   . Poor dental hygiene     Past Surgical History:  Procedure Laterality Date  . DESCENDING AORTIC resesection, replacement  12/1999   Hemashield Graft  . 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 Nyquil Multi-Symptom [Pseudoeph-Doxylamine-Dm-Apap] Itching and Rash    Social  History  Substance Use Topics  . Smoking status: Never Smoker  . Smokeless tobacco: Never Used  . Alcohol use No     Comment: quit in 2008    No family history on file.   Review of Systems  Constitutional: Negative.   HENT: Negative.   Eyes: Negative.   Respiratory: Negative.   Cardiovascular: Negative.   Gastrointestinal: Negative.   Genitourinary: Negative.   Musculoskeletal: Positive for back pain and joint pain.  Skin: Negative.   Neurological: Negative.   Endo/Heme/Allergies: Negative.   Psychiatric/Behavioral: Negative.     Objective:  Physical Exam  Vital signs in last 24 hours: @VSRANGES @  Labs:   Estimated body mass index is 26.45 kg/m as calculated from the following:   Height as of 12/01/15: 5\' 1"  (1.549 m).   Weight as of 12/01/15: 63.5 kg (140 lb).   Imaging Review Plain radiographs demonstrate severe degenerative joint disease of the right hip(s). The bone quality appears to be adequate for age and reported activity level.  Assessment/Plan:  End stage arthritis, right hip(s)  The patient history, physical examination, clinical judgement of the provider and imaging studies are consistent with end stage degenerative joint disease of the right hip(s) and total hip arthroplasty is deemed medically necessary. The treatment options including medical management, injection therapy, arthroscopy and arthroplasty were discussed at length. The risks and benefits of total hip arthroplasty were presented and reviewed. The risks due to aseptic loosening, infection, stiffness, dislocation/subluxation,  thromboembolic complications and other imponderables were discussed.  The patient acknowledged the explanation, agreed to proceed with the plan and consent was signed. Patient is being admitted for inpatient treatment for surgery, pain control,  PT, OT, prophylactic antibiotics, VTE prophylaxis, progressive ambulation and ADL's and discharge planning.The patient is planning to  be discharged home with home health services. (+) eliquis

## 2016-02-08 NOTE — Patient Instructions (Signed)
Dale Sanford  02/08/2016   Your procedure is scheduled on: 02/18/2016    Report to [facility] Main  Entrance take [facility]  elevators to 3rd floor to  Short Stay Center at    1045 AM.  Call this number if you have problems the morning of surgery [phone]   Remember: ONLY 1 PERSON MAY GO WITH YOU TO SHORT STAY TO GET  READY MORNING OF YOUR SURGERY.  Do not eat food or drink liquids :After Midnight.     Take these medicines the morning of surgery with A SIP OF WATER: Amlodipine ( NOrvasc0, Metoprolol ( Lopressor)                                 You may not have any metal on your body including hair pins and              piercings  Do not wear jewelry, , lotions, powders or perfumes, deodorant                           Men may shave face and neck.   Do not bring valuables to the hospital. [facility] IS NOT             RESPONSIBLE   FOR VALUABLES.  Contacts, dentures or bridgework may not be worn into surgery.  Leave suitcase in the car. After surgery it may be brought to your room.        Special Instructions: N/A              Please read over the following fact sheets you were given: _____________________________________________________________________             [facility] - Preparing for Surgery Before surgery, you can play an important role.  Because skin is not sterile, your skin needs to be as free of germs as possible.  You can reduce the number of germs on your skin by washing with CHG (chlorahexidine gluconate) soap before surgery.  CHG is an antiseptic cleaner which kills germs and bonds with the skin to continue killing germs even after washing. Please DO NOT use if you have an allergy to CHG or antibacterial soaps.  If your skin becomes reddened/irritated stop using the CHG and inform your nurse when you arrive at Short Stay. Do not shave (including legs and underarms) for at least 48 hours prior to the first CHG shower.  You may shave  your face/neck. Please follow these instructions carefully:  1.  Shower with CHG Soap the night before surgery and the  morning of Surgery.  2.  If you choose to wash your hair, wash your hair first as usual with your  normal  shampoo.  3.  After you shampoo, rinse your hair and body thoroughly to remove the  shampoo.                           4.  Use CHG as you would any other liquid soap.  You can apply chg directly  to the skin and wash                       Gently with a scrungie or clean washcloth.  5.  Apply  the CHG Soap to your body ONLY FROM THE NECK DOWN.   Do not use on face/ open                           Wound or open sores. Avoid contact with eyes, ears mouth and genitals (private parts).                       Wash face,  Genitals (private parts) with your normal soap.             6.  Wash thoroughly, paying special attention to the area where your surgery  will be performed.  7.  Thoroughly rinse your body with warm water from the neck down.  8.  DO NOT shower/wash with your normal soap after using and rinsing off  the CHG Soap.                9.  Pat yourself dry with a clean towel.            10.  Wear clean pajamas.            11.  Place clean sheets on your bed the night of your first shower and do not  sleep with pets. Day of Surgery : Do not apply any lotions/deodorants the morning of surgery.  Please wear clean clothes to the hospital/surgery center.  FAILURE TO FOLLOW THESE INSTRUCTIONS MAY RESULT IN THE CANCELLATION OF YOUR SURGERY PATIENT SIGNATURE_________________________________  NURSE SIGNATURE__________________________________  ________________________________________________________________________  WHAT IS A BLOOD TRANSFUSION? Blood Transfusion Information  A transfusion is the replacement of blood or some of its parts. Blood is made up of multiple cells which provide different functions.  Red blood cells carry oxygen and are used for blood loss  replacement.  White blood cells fight against infection.  Platelets control bleeding.  Plasma helps clot blood.  Other blood products are available for specialized needs, such as hemophilia or other clotting disorders. BEFORE THE TRANSFUSION  Who gives blood for transfusions?   Healthy volunteers who are fully evaluated to make sure their blood is safe. This is blood bank blood. Transfusion therapy is the safest it has ever been in the practice of medicine. Before blood is taken from a donor, a complete history is taken to make sure that person has no history of diseases nor engages in risky social behavior (examples are intravenous drug use or sexual activity with multiple partners). The donor's travel history is screened to minimize risk of transmitting infections, such as malaria. The donated blood is tested for signs of infectious diseases, such as HIV and hepatitis. The blood is then tested to be sure it is compatible with you in order to minimize the chance of a transfusion reaction. If you or a relative donates blood, this is often done in anticipation of surgery and is not appropriate for emergency situations. It takes many days to process the donated blood. RISKS AND COMPLICATIONS Although transfusion therapy is very safe and saves many lives, the main dangers of transfusion include:   Getting an infectious disease.  Developing a transfusion reaction. This is an allergic reaction to something in the blood you were given. Every precaution is taken to prevent this. The decision to have a blood transfusion has been considered carefully by your caregiver before blood is given. Blood is not given unless the benefits outweigh the risks. AFTER THE TRANSFUSION  Right after receiving a blood  transfusion, you will usually feel much better and more energetic. This is especially true if your red blood cells have gotten low (anemic). The transfusion raises the level of the red blood cells which  carry oxygen, and this usually causes an energy increase.  The nurse administering the transfusion will monitor you carefully for complications. HOME CARE INSTRUCTIONS  No special instructions are needed after a transfusion. You may find your energy is better. Speak with your caregiver about any limitations on activity for underlying diseases you may have. SEEK MEDICAL CARE IF:   Your condition is not improving after your transfusion.  You develop redness or irritation at the intravenous (IV) site. SEEK IMMEDIATE MEDICAL CARE IF:  Any of the following symptoms occur over the next 12 hours:  Shaking chills.  You have a temperature by mouth above 102 F (38.9 C), not controlled by medicine.  Chest, back, or muscle pain.  People around you feel you are not acting correctly or are confused.  Shortness of breath or difficulty breathing.  Dizziness and fainting.  You get a rash or develop hives.  You have a decrease in urine output.  Your urine turns a dark color or changes to pink, red, or brown. Any of the following symptoms occur over the next 10 days:  You have a temperature by mouth above 102 F (38.9 C), not controlled by medicine.  Shortness of breath.  Weakness after normal activity.  The white part of the eye turns yellow (jaundice).  You have a decrease in the amount of urine or are urinating less often.  Your urine turns a dark color or changes to pink, red, or brown. Document Released: 03/04/2000 Document Revised: 05/30/2011 Document Reviewed: 10/22/2007 ExitCare Patient Information 2014 [place].  _______________________________________________________________________  Incentive Spirometer  An incentive spirometer is a tool that can help keep your lungs clear and active. This tool measures how well you are filling your lungs with each breath. Taking long deep breaths may help reverse or decrease the chance of developing breathing (pulmonary) problems  (especially infection) following:  A long period of time when you are unable to move or be active. BEFORE THE PROCEDURE   If the spirometer includes an indicator to show your best effort, your nurse or respiratory therapist will set it to a desired goal.  If possible, sit up straight or lean slightly forward. Try not to slouch.  Hold the incentive spirometer in an upright position. INSTRUCTIONS FOR USE  1. Sit on the edge of your bed if possible, or sit up as far as you can in bed or on a chair. 2. Hold the incentive spirometer in an upright position. 3. Breathe out normally. 4. Place the mouthpiece in your mouth and seal your lips tightly around it. 5. Breathe in slowly and as deeply as possible, raising the piston or the ball toward the top of the column. 6. Hold your breath for 3-5 seconds or for as long as possible. Allow the piston or ball to fall to the bottom of the column. 7. Remove the mouthpiece from your mouth and breathe out normally. 8. Rest for a few seconds and repeat Steps 1 through 7 at least 10 times every 1-2 hours when you are awake. Take your time and take a few normal breaths between deep breaths. 9. The spirometer may include an indicator to show your best effort. Use the indicator as a goal to work toward during each repetition. 10. After each set of 10 deep  breaths, practice coughing to be sure your lungs are clear. If you have an incision (the cut made at the time of surgery), support your incision when coughing by placing a pillow or rolled up towels firmly against it. Once you are able to get out of bed, walk around indoors and cough well. You may stop using the incentive spirometer when instructed by your caregiver.  RISKS AND COMPLICATIONS  Take your time so you do not get dizzy or light-headed.  If you are in pain, you may need to take or ask for pain medication before doing incentive spirometry. It is harder to take a deep breath if you are having  pain. AFTER USE  Rest and breathe slowly and easily.  It can be helpful to keep track of a log of your progress. Your caregiver can provide you with a simple table to help with this. If you are using the spirometer at home, follow these instructions: [place] IF:   You are having difficultly using the spirometer.  You have trouble using the spirometer as often as instructed.  Your pain medication is not giving enough relief while using the spirometer.  You develop fever of 100.5 F (38.1 C) or higher. SEEK IMMEDIATE MEDICAL CARE IF:   You cough up bloody sputum that had not been present before.  You develop fever of 102 F (38.9 C) or greater.  You develop worsening pain at or near the incision site. MAKE SURE YOU:   Understand these instructions.  Will watch your condition.  Will get help right away if you are not doing well or get worse. Document Released: 07/18/2006 Document Revised: 05/30/2011 Document Reviewed: 09/18/2006 [facility] Patient Information 2014 [place], [place].   ________________________________________________________________________

## 2016-02-10 ENCOUNTER — Encounter (HOSPITAL_COMMUNITY)
Admission: RE | Admit: 2016-02-10 | Discharge: 2016-02-10 | Disposition: A | Discharge status: Home / Self Care | Payer: BLUE CROSS/BLUE SHIELD | Source: Ambulatory Visit | Attending: Orthopedic Surgery | Admitting: Orthopedic Surgery

## 2016-02-10 ENCOUNTER — Encounter (HOSPITAL_COMMUNITY): Payer: Self-pay

## 2016-02-10 DIAGNOSIS — Z01818 Encounter for other preprocedural examination: Secondary | ICD-10-CM | POA: Insufficient documentation

## 2016-02-10 DIAGNOSIS — M1611 Unilateral primary osteoarthritis, right hip: Secondary | ICD-10-CM | POA: Diagnosis not present

## 2016-02-10 HISTORY — DX: Gastro-esophageal reflux disease without esophagitis: K21.9

## 2016-02-10 LAB — CBC
HCT: 41.8 % (ref 39.0–52.0)
Hemoglobin: 15.2 g/dL (ref 13.0–17.0)
MCH: 29.6 pg (ref 26.0–34.0)
MCHC: 36.4 g/dL — AB (ref 30.0–36.0)
MCV: 81.5 fL (ref 78.0–100.0)
PLATELETS: 197 10*3/uL (ref 150–400)
RBC: 5.13 MIL/uL (ref 4.22–5.81)
RDW: 12.8 % (ref 11.5–15.5)
WBC: 10.5 10*3/uL (ref 4.0–10.5)

## 2016-02-10 LAB — BASIC METABOLIC PANEL
Anion gap: 8 (ref 5–15)
BUN: 13 mg/dL (ref 6–20)
CALCIUM: 8.9 mg/dL (ref 8.9–10.3)
CHLORIDE: 105 mmol/L (ref 101–111)
CO2: 25 mmol/L (ref 22–32)
CREATININE: 0.83 mg/dL (ref 0.61–1.24)
GFR calc non Af Amer: >60 mL/min (ref >60)
GLUCOSE: 77 mg/dL (ref 65–99)
Potassium: 4.1 mmol/L (ref 3.5–5.1)
Sodium: 138 mmol/L (ref 135–145)

## 2016-02-10 LAB — SURGICAL PCR SCREEN
MRSA, PCR: NEGATIVE
Staphylococcus aureus: NEGATIVE

## 2016-02-10 LAB — ABO/RH: ABO/RH(D): O POS

## 2016-02-10 NOTE — Progress Notes (Signed)
8/28/`7- EKG-epic Stress test- 12/01/15- EPIC  And ECHO- 11/2015 - epic  04/27/15- CXR- EPIC  11/16/15- Preop clearance note- epic  Dr Armanda Magic- 11/16/15- clearance on chart and clearance from Dr Barton Fanny on chart  01/21/16- root canal on chart

## 2016-02-18 ENCOUNTER — Encounter (HOSPITAL_COMMUNITY): Payer: Self-pay | Admitting: *Deleted

## 2016-02-18 ENCOUNTER — Inpatient Hospital Stay (HOSPITAL_COMMUNITY): Payer: BLUE CROSS/BLUE SHIELD

## 2016-02-18 ENCOUNTER — Inpatient Hospital Stay (HOSPITAL_COMMUNITY): Payer: BLUE CROSS/BLUE SHIELD | Admitting: Anesthesiology

## 2016-02-18 ENCOUNTER — Inpatient Hospital Stay (HOSPITAL_COMMUNITY)
Admission: RE | Admit: 2016-02-18 | Discharge: 2016-02-19 | DRG: 470 | Disposition: A | Discharge status: Home Health | Payer: BLUE CROSS/BLUE SHIELD | Source: Ambulatory Visit | Attending: Orthopedic Surgery | Admitting: Orthopedic Surgery

## 2016-02-18 ENCOUNTER — Encounter (HOSPITAL_COMMUNITY)
Admission: RE | Disposition: A | Discharge status: Home Health | Payer: Self-pay | Source: Ambulatory Visit | Attending: Orthopedic Surgery

## 2016-02-18 DIAGNOSIS — M167 Other unilateral secondary osteoarthritis of hip: Secondary | ICD-10-CM | POA: Diagnosis not present

## 2016-02-18 DIAGNOSIS — M87051 Idiopathic aseptic necrosis of right femur: Secondary | ICD-10-CM | POA: Diagnosis not present

## 2016-02-18 DIAGNOSIS — K219 Gastro-esophageal reflux disease without esophagitis: Secondary | ICD-10-CM | POA: Diagnosis not present

## 2016-02-18 DIAGNOSIS — M25551 Pain in right hip: Secondary | ICD-10-CM | POA: Diagnosis not present

## 2016-02-18 DIAGNOSIS — Z952 Presence of prosthetic heart valve: Secondary | ICD-10-CM

## 2016-02-18 DIAGNOSIS — R269 Unspecified abnormalities of gait and mobility: Secondary | ICD-10-CM | POA: Diagnosis not present

## 2016-02-18 DIAGNOSIS — M1611 Unilateral primary osteoarthritis, right hip: Secondary | ICD-10-CM | POA: Diagnosis not present

## 2016-02-18 DIAGNOSIS — Z96641 Presence of right artificial hip joint: Secondary | ICD-10-CM | POA: Diagnosis not present

## 2016-02-18 DIAGNOSIS — Z471 Aftercare following joint replacement surgery: Secondary | ICD-10-CM | POA: Diagnosis not present

## 2016-02-18 DIAGNOSIS — Z09 Encounter for follow-up examination after completed treatment for conditions other than malignant neoplasm: Secondary | ICD-10-CM

## 2016-02-18 DIAGNOSIS — I358 Other nonrheumatic aortic valve disorders: Secondary | ICD-10-CM | POA: Diagnosis not present

## 2016-02-18 DIAGNOSIS — I10 Essential (primary) hypertension: Secondary | ICD-10-CM | POA: Diagnosis not present

## 2016-02-18 DIAGNOSIS — Z419 Encounter for procedure for purposes other than remedying health state, unspecified: Secondary | ICD-10-CM

## 2016-02-18 HISTORY — DX: Idiopathic aseptic necrosis of right femur: M87.051

## 2016-02-18 HISTORY — PX: TOTAL HIP ARTHROPLASTY: SHX124

## 2016-02-18 LAB — TYPE AND SCREEN
ABO/RH(D): O POS
Antibody Screen: NEGATIVE

## 2016-02-18 SURGERY — ARTHROPLASTY, HIP, TOTAL, ANTERIOR APPROACH
Anesthesia: Monitor Anesthesia Care | Site: Hip | Laterality: Right

## 2016-02-18 MED ORDER — ACETAMINOPHEN 10 MG/ML IV SOLN
INTRAVENOUS | Status: DC | PRN
  Administered 2016-02-18: 1000 mg via INTRAVENOUS

## 2016-02-18 MED ORDER — ACETAMINOPHEN 10 MG/ML IV SOLN
INTRAVENOUS | Status: AC
  Filled 2016-02-18: qty 100

## 2016-02-18 MED ORDER — MENTHOL 3 MG MT LOZG: 1.0000 | LOZENGE | OROMUCOSAL | Status: DC | PRN

## 2016-02-18 MED ORDER — BUPIVACAINE HCL (PF) 0.5 % IJ SOLN
INTRAMUSCULAR | Status: DC | PRN
  Administered 2016-02-18: 3 mL

## 2016-02-18 MED ORDER — DOCUSATE SODIUM 100 MG PO CAPS
100.0000 mg | ORAL_CAPSULE | Freq: Two times a day (BID) | ORAL | Status: DC
  Administered 2016-02-18 – 2016-02-19 (×2): 100 mg via ORAL
  Filled 2016-02-18 (×2): qty 1

## 2016-02-18 MED ORDER — TRANEXAMIC ACID 1000 MG/10ML IV SOLN
1000.0000 mg | INTRAVENOUS | Status: AC
  Administered 2016-02-18: 1000 mg via INTRAVENOUS
  Filled 2016-02-18: qty 1100

## 2016-02-18 MED ORDER — METHOCARBAMOL 500 MG PO TABS: 500.0000 mg | ORAL_TABLET | Freq: Four times a day (QID) | ORAL | Status: DC | PRN

## 2016-02-18 MED ORDER — METOCLOPRAMIDE HCL 5 MG/ML IJ SOLN: 5.0000 mg | Freq: Three times a day (TID) | INTRAMUSCULAR | Status: DC | PRN

## 2016-02-18 MED ORDER — HYDROMORPHONE HCL 1 MG/ML IJ SOLN: 0.5000 mg | INTRAMUSCULAR | Status: DC | PRN

## 2016-02-18 MED ORDER — DEXAMETHASONE SODIUM PHOSPHATE 10 MG/ML IJ SOLN
INTRAMUSCULAR | Status: AC
  Filled 2016-02-18: qty 1

## 2016-02-18 MED ORDER — LACTATED RINGERS IV SOLN
INTRAVENOUS | Status: DC
  Administered 2016-02-18 (×3): via INTRAVENOUS

## 2016-02-18 MED ORDER — CEFAZOLIN IN D5W 1 GM/50ML IV SOLN
1.0000 g | Freq: Four times a day (QID) | INTRAVENOUS | Status: AC
  Administered 2016-02-18 – 2016-02-19 (×2): 1 g via INTRAVENOUS
  Filled 2016-02-18 (×2): qty 50

## 2016-02-18 MED ORDER — FENTANYL CITRATE (PF) 100 MCG/2ML IJ SOLN
INTRAMUSCULAR | Status: AC
  Filled 2016-02-18: qty 2

## 2016-02-18 MED ORDER — SODIUM CHLORIDE 0.9 % IV SOLN
INTRAVENOUS | Status: DC
  Administered 2016-02-18 – 2016-02-19 (×2): via INTRAVENOUS

## 2016-02-18 MED ORDER — CEFAZOLIN SODIUM-DEXTROSE 2-4 GM/100ML-% IV SOLN
INTRAVENOUS | Status: AC
  Filled 2016-02-18: qty 100

## 2016-02-18 MED ORDER — SODIUM CHLORIDE 0.9 % IJ SOLN
INTRAMUSCULAR | Status: AC
  Filled 2016-02-18: qty 50

## 2016-02-18 MED ORDER — PROPOFOL 10 MG/ML IV BOLUS
INTRAVENOUS | Status: AC
  Filled 2016-02-18: qty 60

## 2016-02-18 MED ORDER — HYDROCODONE-ACETAMINOPHEN 5-325 MG PO TABS
1.0000 | ORAL_TABLET | ORAL | Status: DC | PRN
  Administered 2016-02-19: 1 via ORAL
  Filled 2016-02-18: qty 1

## 2016-02-18 MED ORDER — METOCLOPRAMIDE HCL 5 MG PO TABS: 5.0000 mg | ORAL_TABLET | Freq: Three times a day (TID) | ORAL | Status: DC | PRN

## 2016-02-18 MED ORDER — MIDAZOLAM HCL 2 MG/2ML IJ SOLN
INTRAMUSCULAR | Status: AC
  Filled 2016-02-18: qty 2

## 2016-02-18 MED ORDER — APIXABAN 2.5 MG PO TABS
2.5000 mg | ORAL_TABLET | Freq: Two times a day (BID) | ORAL | Status: DC
  Administered 2016-02-19: 2.5 mg via ORAL
  Filled 2016-02-18: qty 1

## 2016-02-18 MED ORDER — METHOCARBAMOL 1000 MG/10ML IJ SOLN
500.0000 mg | Freq: Four times a day (QID) | INTRAVENOUS | Status: DC | PRN
  Filled 2016-02-18: qty 5

## 2016-02-18 MED ORDER — ONDANSETRON HCL 4 MG/2ML IJ SOLN: 4.0000 mg | Freq: Four times a day (QID) | INTRAMUSCULAR | Status: DC | PRN

## 2016-02-18 MED ORDER — ACETAMINOPHEN 325 MG PO TABS: 650.0000 mg | ORAL_TABLET | Freq: Four times a day (QID) | ORAL | Status: DC | PRN

## 2016-02-18 MED ORDER — PROPOFOL 10 MG/ML IV BOLUS
INTRAVENOUS | Status: AC
  Filled 2016-02-18: qty 20

## 2016-02-18 MED ORDER — DEXAMETHASONE SODIUM PHOSPHATE 10 MG/ML IJ SOLN
INTRAMUSCULAR | Status: DC | PRN
  Administered 2016-02-18: 10 mg via INTRAVENOUS

## 2016-02-18 MED ORDER — ACETAMINOPHEN 10 MG/ML IV SOLN
1000.0000 mg | INTRAVENOUS | Status: DC
  Filled 2016-02-18: qty 100

## 2016-02-18 MED ORDER — ACETAMINOPHEN 650 MG RE SUPP: 650.0000 mg | Freq: Four times a day (QID) | RECTAL | Status: DC | PRN

## 2016-02-18 MED ORDER — ONDANSETRON HCL 4 MG PO TABS: 4.0000 mg | ORAL_TABLET | Freq: Four times a day (QID) | ORAL | Status: DC | PRN

## 2016-02-18 MED ORDER — SODIUM CHLORIDE 0.9 % IJ SOLN
INTRAMUSCULAR | Status: DC | PRN
  Administered 2016-02-18: 30 mL

## 2016-02-18 MED ORDER — SENNA 8.6 MG PO TABS
2.0000 | ORAL_TABLET | Freq: Every day | ORAL | Status: DC
  Administered 2016-02-18: 17.2 mg via ORAL
  Filled 2016-02-18: qty 2

## 2016-02-18 MED ORDER — HYDROGEN PEROXIDE 3 % EX SOLN
CUTANEOUS | Status: AC
  Filled 2016-02-18: qty 473

## 2016-02-18 MED ORDER — KETOROLAC TROMETHAMINE 30 MG/ML IJ SOLN
INTRAMUSCULAR | Status: AC
  Filled 2016-02-18: qty 1

## 2016-02-18 MED ORDER — PHENOL 1.4 % MT LIQD: 1.0000 | OROMUCOSAL | Status: DC | PRN

## 2016-02-18 MED ORDER — ISOPROPYL ALCOHOL 70 % SOLN
Status: AC
  Filled 2016-02-18: qty 480

## 2016-02-18 MED ORDER — FENTANYL CITRATE (PF) 100 MCG/2ML IJ SOLN
INTRAMUSCULAR | Status: DC | PRN
  Administered 2016-02-18 (×2): 50 ug via INTRAVENOUS

## 2016-02-18 MED ORDER — CHLORHEXIDINE GLUCONATE 4 % EX LIQD: 60.0000 mL | Freq: Once | CUTANEOUS | Status: DC

## 2016-02-18 MED ORDER — ONDANSETRON HCL 4 MG/2ML IJ SOLN
INTRAMUSCULAR | Status: DC | PRN
  Administered 2016-02-18: 4 mg via INTRAVENOUS

## 2016-02-18 MED ORDER — 0.9 % SODIUM CHLORIDE (POUR BTL) OPTIME
TOPICAL | Status: DC | PRN
  Administered 2016-02-18: 1000 mL

## 2016-02-18 MED ORDER — PROPOFOL 10 MG/ML IV BOLUS
INTRAVENOUS | Status: DC | PRN
  Administered 2016-02-18: 20 mg via INTRAVENOUS

## 2016-02-18 MED ORDER — PROPOFOL 500 MG/50ML IV EMUL
INTRAVENOUS | Status: DC | PRN
  Administered 2016-02-18: 100 ug/kg/min via INTRAVENOUS

## 2016-02-18 MED ORDER — LACTATED RINGERS IV SOLN: INTRAVENOUS | Status: DC | PRN

## 2016-02-18 MED ORDER — CEFAZOLIN SODIUM-DEXTROSE 2-4 GM/100ML-% IV SOLN
2.0000 g | INTRAVENOUS | Status: AC
  Administered 2016-02-18: 2 g via INTRAVENOUS

## 2016-02-18 MED ORDER — POLYETHYLENE GLYCOL 3350 17 G PO PACK: 17.0000 g | PACK | Freq: Every day | ORAL | Status: DC | PRN

## 2016-02-18 MED ORDER — BUPIVACAINE HCL (PF) 0.25 % IJ SOLN
INTRAMUSCULAR | Status: AC
  Filled 2016-02-18: qty 30

## 2016-02-18 MED ORDER — ONDANSETRON HCL 4 MG/2ML IJ SOLN
INTRAMUSCULAR | Status: AC
  Filled 2016-02-18: qty 2

## 2016-02-18 MED ORDER — MIDAZOLAM HCL 5 MG/5ML IJ SOLN
INTRAMUSCULAR | Status: DC | PRN
  Administered 2016-02-18 (×2): 1 mg via INTRAVENOUS

## 2016-02-18 MED ORDER — DEXAMETHASONE SODIUM PHOSPHATE 10 MG/ML IJ SOLN
10.0000 mg | Freq: Once | INTRAMUSCULAR | Status: AC
  Administered 2016-02-19: 10 mg via INTRAVENOUS
  Filled 2016-02-18: qty 1

## 2016-02-18 MED ORDER — SODIUM CHLORIDE 0.9 % IR SOLN
Status: DC | PRN
  Administered 2016-02-18: 3000 mL

## 2016-02-18 MED ORDER — BUPIVACAINE HCL 0.25 % IJ SOLN
INTRAMUSCULAR | Status: DC | PRN
  Administered 2016-02-18: 30 mL

## 2016-02-18 MED ORDER — KETOROLAC TROMETHAMINE 30 MG/ML IJ SOLN
INTRAMUSCULAR | Status: DC | PRN
  Administered 2016-02-18: 30 mg

## 2016-02-18 MED ORDER — DEXTROSE 5 % IV SOLN
INTRAVENOUS | Status: DC | PRN
  Administered 2016-02-18: 35 ug/min via INTRAVENOUS

## 2016-02-18 MED ORDER — METOPROLOL TARTRATE 100 MG PO TABS
100.0000 mg | ORAL_TABLET | Freq: Two times a day (BID) | ORAL | Status: DC
  Administered 2016-02-18: 100 mg via ORAL
  Filled 2016-02-18: qty 2
  Filled 2016-02-18 (×2): qty 1

## 2016-02-18 SURGICAL SUPPLY — 44 items
ADH SKN CLS APL DERMABOND .7 (GAUZE/BANDAGES/DRESSINGS) ×2
BAG DECANTER FOR FLEXI CONT (MISCELLANEOUS) IMPLANT
BAG SPEC THK2 15X12 ZIP CLS (MISCELLANEOUS)
BAG ZIPLOCK 12X15 (MISCELLANEOUS) IMPLANT
CAPT HIP TOTAL 2 ×2 IMPLANT
CHLORAPREP W/TINT 26ML (MISCELLANEOUS) ×2 IMPLANT
CLOTH BEACON ORANGE TIMEOUT ST (SAFETY) ×2 IMPLANT
COVER PERINEAL POST (MISCELLANEOUS) ×2 IMPLANT
DECANTER SPIKE VIAL GLASS SM (MISCELLANEOUS) ×2 IMPLANT
DERMABOND ADVANCED (GAUZE/BANDAGES/DRESSINGS) ×2
DERMABOND ADVANCED .7 DNX12 (GAUZE/BANDAGES/DRESSINGS) ×2 IMPLANT
DRAPE SHEET LG 3/4 BI-LAMINATE (DRAPES) ×4 IMPLANT
DRAPE STERI IOBAN 125X83 (DRAPES) ×2 IMPLANT
DRAPE U-SHAPE 47X51 STRL (DRAPES) ×4 IMPLANT
DRSG AQUACEL AG ADV 3.5X10 (GAUZE/BANDAGES/DRESSINGS) ×2 IMPLANT
ELECT PENCIL ROCKER SW 15FT (MISCELLANEOUS) ×2 IMPLANT
ELECT REM PT RETURN 15FT ADLT (MISCELLANEOUS) ×2 IMPLANT
GAUZE SPONGE 4X4 12PLY STRL (GAUZE/BANDAGES/DRESSINGS) ×2 IMPLANT
GLOVE BIO SURGEON STRL SZ8.5 (GLOVE) ×5 IMPLANT
GLOVE BIOGEL PI IND STRL 8.5 (GLOVE) ×2 IMPLANT
GLOVE BIOGEL PI INDICATOR 8.5 (GLOVE) ×2
GOWN SPEC L3 XXLG W/TWL (GOWN DISPOSABLE) ×2 IMPLANT
HANDPIECE INTERPULSE COAX TIP (DISPOSABLE) ×2
HOLDER FOLEY CATH W/STRAP (MISCELLANEOUS) ×2 IMPLANT
HOOD PEEL AWAY FLYTE STAYCOOL (MISCELLANEOUS) ×4 IMPLANT
MARKER SKIN DUAL TIP RULER LAB (MISCELLANEOUS) ×2 IMPLANT
NEEDLE SPNL 18GX3.5 QUINCKE PK (NEEDLE) ×2 IMPLANT
PACK ANTERIOR HIP CUSTOM (KITS) ×2 IMPLANT
SAW OSC TIP CART 19.5X105X1.3 (SAW) ×2 IMPLANT
SEALER BIPOLAR AQUA 6.0 (INSTRUMENTS) ×2 IMPLANT
SET HNDPC FAN SPRY TIP SCT (DISPOSABLE) ×1 IMPLANT
SOL PREP POV-IOD 4OZ 10% (MISCELLANEOUS) ×2 IMPLANT
SUT ETHIBOND NAB CT1 #1 30IN (SUTURE) ×4 IMPLANT
SUT MNCRL AB 3-0 PS2 18 (SUTURE) ×2 IMPLANT
SUT MON AB 2-0 CT1 36 (SUTURE) ×4 IMPLANT
SUT STRATAFIX PDO 1 14 VIOLET (SUTURE) ×2
SUT STRATFX PDO 1 14 VIOLET (SUTURE) ×1
SUT VIC AB 2-0 CT1 27 (SUTURE) ×2
SUT VIC AB 2-0 CT1 TAPERPNT 27 (SUTURE) ×1 IMPLANT
SUTURE STRATFX PDO 1 14 VIOLET (SUTURE) ×1 IMPLANT
SYR 50ML LL SCALE MARK (SYRINGE) ×2 IMPLANT
TRAY FOLEY W/METER SILVER 16FR (SET/KITS/TRAYS/PACK) ×1 IMPLANT
WATER STERILE IRR 1500ML POUR (IV SOLUTION) ×3 IMPLANT
YANKAUER SUCT BULB TIP 10FT TU (MISCELLANEOUS) ×2 IMPLANT

## 2016-02-18 NOTE — Op Note (Signed)
OPERATIVE REPORT  SURGEON: Samson FredericBrian Marisela Line, MD   ASSISTANT: Staff.  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   PROCEDURE: Right total hip arthroplasty, anterior approach.   IMPLANTS: DePuy Tri Lock stem, size 7, std offset. DePuy Pinnacle Cup, size 52 mm. DePuy Altrx liner, size 32 by 52 mm, neutral. DePuy Biolox ceramic head ball, size 32 + 5 mm.  ANESTHESIA:  Spinal  ESTIMATED BLOOD LOSS: 400 mL.   ANTIBIOTICS: 2 g Ancef.  DRAINS: None.  COMPLICATIONS: None.   CONDITION: PACU - hemodynamically stable.  BRIEF CLINICAL NOTE: Dale Sanford is a 65 y.o. 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 After failing conservative management, the patient was indicated for total hip arthroplasty. The risks, benefits, and alternatives to the procedure were explained, and the patient elected to proceed.  PROCEDURE IN DETAIL: Surgical site was marked by myself. Once inside the operative room, Spinal anesthesia was obtained, and a foley catheter was inserted. The patient was then positioned on the Hana table. All bony prominences were well padded. The hip was prepped and draped in the normal sterile surgical fashion. A time-out was called verifying side and site of surgery. The patient received IV antibiotics within 60 minutes of beginning the procedure.  The direct anterior approach to the hip was performed through the Hueter interval. Lateral femoral circumflex vessels were treated with the Auqumantys. The anterior capsule was exposed and an inverted T capsulotomy was made.The femoral neck cut was made to the level of the templated cut. A corkscrew was placed into the head and the head was removed. The femoral head was found to have eburnated bone. The head was passed to the back table and was measured.  Acetabular  exposure was achieved, and the pulvinar and labrum were excised. Sequental reaming of the acetabulum was then performed up to a size 51 mm reamer. A 52 mm cup was then opened and impacted into place at approximately 40 degrees of abduction and 20 degrees of anteversion. The final polyethylene liner was impacted into place and acetabular osteophytes were removed.   I then gained femoral exposure taking care to protect the abductors and greater trochanter. This was performed using standard external rotation, extension, and adduction. The capsule was peeled off the inner aspect of the greater trochanter, taking care to preserve the short external rotators. A cookie cutter was used to enter the femoral canal, and then the femoral canal finder was placed. Sequential broaching was performed up to a size 7. Calcar planer was used on the femoral neck remnant. I placed a std offset neck and a trial head ball. The hip was reduced. Leg lengths and offset were checked fluoroscopically. The hip was dislocated and trial components were removed. The final implants were placed, and the hip was reduced.  Fluoroscopy was used to confirm component position and leg lengths. At 90 degrees of external rotation and full extension, the hip was stable to an anterior directed force.  The wound was copiously irrigated with a dilute betadine solution followed by normal saline. Marcaine solution was injected into the periarticular soft tissue. The wound was closed in layers using #1 Vicryl and Stratafix for the fascia, 2-0 Vicryl for the subcutaneous fat, 2-0 Monocryl for the deep dermal layer, 3-0 running Monocryl subcuticular stitch, and Dermabond for the skin. Once the glue was fully dried, an Aquacell Ag dressing was applied. The  patient was transported to the recovery room in stable condition. Sponge, needle, and instrument counts were correct at the end of the case x2. The patient tolerated the procedure well and  there were no known complications.

## 2016-02-18 NOTE — Anesthesia Preprocedure Evaluation (Addendum)
Anesthesia Evaluation  Patient identified by MRN, date of birth, ID band Patient awake    Reviewed: Allergy & Precautions, NPO status , Patient's Chart, lab work & pertinent test results, reviewed documented beta blocker date and time   Airway Mallampati: II  TM Distance: >3 FB Neck ROM: Full    Dental no notable dental hx. (+) Poor Dentition, Missing   Pulmonary neg pulmonary ROS,    Pulmonary exam normal breath sounds clear to auscultation       Cardiovascular hypertension, Pt. on home beta blockers Normal cardiovascular exam+ Valvular Problems/Murmurs  Rhythm:Regular Rate:Normal  11/2015 - Left ventricle: The cavity size was normal. Wall thickness was normal. Systolic function was mildly to moderately reduced. The estimated ejection fraction was in the range of 40% to 45%. Diffuse hypokinesis. There is severe hypokinesis of the anteroseptal myocardium. Features are consistent with a pseudonormal left ventricular filling pattern, with concomitant abnormal relaxation and increased filling pressure (grade 2 diastolic dysfunction). Doppler parameters are consistent with high ventricular filling pressure. - Aortic valve: A bioprosthesis was present. There was trivial regurgitation. - Aortic root: The aortic root was mildly dilated. - Mitral valve: There was mild regurgitation. - Left atrium: The atrium was mildly dilated.  Impressions: - Anteroseptal hypokinesis with overall mild to moderately reduced LV systolic function; grade 2 diastolic dysfunction with elevated LV filling pressure; s/p AVR with mild AI; mild MR; mild LAE; mild TR.   Neuro/Psych negative neurological ROS  negative psych ROS   GI/Hepatic Neg liver ROS, GERD  ,  Endo/Other  negative endocrine ROS  Renal/GU negative Renal ROS     Musculoskeletal negative musculoskeletal ROS (+)   Abdominal   Peds  Hematology negative hematology ROS (+)   Anesthesia  Other Findings   Reproductive/Obstetrics negative OB ROS                           Anesthesia Physical Anesthesia Plan  ASA: III  Anesthesia Plan: Spinal   Post-op Pain Management:    Induction:   Airway Management Planned:   Additional Equipment:   Intra-op Plan:   Post-operative Plan:   Informed Consent: I have reviewed the patients History and Physical, chart, labs and discussed the procedure including the risks, benefits and alternatives for the proposed anesthesia with the patient or authorized representative who has indicated his/her understanding and acceptance.   Dental advisory given  Plan Discussed with: CRNA  Anesthesia Plan Comments:         Anesthesia Quick Evaluation

## 2016-02-18 NOTE — Anesthesia Procedure Notes (Signed)
Spinal  Patient location during procedure: OR End time: 02/18/2016 12:55 PM Staffing Performed: anesthesiologist  Preanesthetic Checklist Completed: patient identified, site marked, surgical consent, pre-op evaluation, timeout performed, IV checked, risks and benefits discussed and monitors and equipment checked Spinal Block Patient position: sitting Prep: DuraPrep Patient monitoring: heart rate, continuous pulse ox and blood pressure Location: L3-4 Injection technique: single-shot Needle Needle type: Pencan  Needle gauge: 25 G Needle length: 9 cm Assessment Sensory level: T8 Additional Notes Expiration date of kit checked and confirmed. Patient tolerated procedure well, without complications.

## 2016-02-18 NOTE — Transfer of Care (Signed)
Immediate Anesthesia Transfer of Care Note  Patient: Dale Sanford  Procedure(s) Performed: Procedure(s): RIGHT TOTAL HIP ARTHROPLASTY ANTERIOR APPROACH (Right)  Patient Location: PACU  Anesthesia Type:Spinal  Level of Consciousness:  sedated, patient cooperative and responds to stimulation  Airway & Oxygen Therapy:Patient Spontanous Breathing and Patient connected to face mask oxgen  Post-op Assessment:  Report given to PACU RN and Post -op Vital signs reviewed and stable  Post vital signs:  Reviewed and stable  Last Vitals:  Vitals:   02/18/16 1046  BP: (!) 150/53  Pulse: 62  Resp: 18  Temp: 36.8 C    Complications: No apparent anesthesia complications

## 2016-02-18 NOTE — Interval H&P Note (Signed)
History and Physical Interval Note:  02/18/2016 12:38 PM  Dale Sanford  has presented today for surgery, with the diagnosis of DJD RIGHT HIP  The various methods of treatment have been discussed with the patient and family. After consideration of risks, benefits and other options for treatment, the patient has consented to  Procedure(s): RIGHT TOTAL HIP ARTHROPLASTY ANTERIOR APPROACH (Right) as a surgical intervention .  The patient's history has been reviewed, patient examined, no change in status, stable for surgery.  I have reviewed the patient's chart and labs.  Questions were answered to the patient's satisfaction.     Guynell Kleiber, Cloyde Reams

## 2016-02-18 NOTE — Anesthesia Postprocedure Evaluation (Signed)
Anesthesia Post Note  Patient: Dale Sanford  Procedure(s) Performed: Procedure(s) (LRB): RIGHT TOTAL HIP ARTHROPLASTY ANTERIOR APPROACH (Right)  Patient location during evaluation: PACU Anesthesia Type: Spinal and MAC Level of consciousness: awake and alert Pain management: pain level controlled Vital Signs Assessment: post-procedure vital signs reviewed and stable Respiratory status: spontaneous breathing and respiratory function stable Cardiovascular status: blood pressure returned to baseline and stable Postop Assessment: spinal receding Anesthetic complications: no    Last Vitals:  Vitals:   02/18/16 1615 02/18/16 1630  BP: (!) 143/55 (!) 139/51  Pulse: (!) 57 (!) 55  Resp: 20 18  Temp:      Last Pain:  Vitals:   02/18/16 1630  TempSrc:   PainSc: Asleep                 Lewie Loron

## 2016-02-18 NOTE — H&P (View-Only) (Signed)
TOTAL HIP ADMISSION H&P  Patient is admitted for right total hip arthroplasty.  Subjective:  Chief Complaint: right hip pain  HPI: Dale Sanford, 65 y.o. male, has a history of pain and functional disability in the right hip(s) due to arthritis and patient has failed non-surgical conservative treatments for greater than 12 weeks to include NSAID's and/or analgesics, flexibility and strengthening excercises, use of assistive devices, weight reduction as appropriate and activity modification.  Onset of symptoms was gradual starting 5 years ago with gradually worsening course since that time.The patient noted no past surgery on the right hip(s).  Patient currently rates pain in the right hip at 10 out of 10 with activity. Patient has night pain, worsening of pain with activity and weight bearing, trendelenberg gait, pain that interfers with activities of daily living and pain with passive range of motion. Patient has evidence of subchondral cysts, subchondral sclerosis, periarticular osteophytes, joint subluxation and joint space narrowing by imaging studies. This condition presents safety issues increasing the risk of falls. There is no current active infection.  Patient Active Problem List   Diagnosis Date Noted  . Aortic valve disorders 09/03/2013  . 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 Unspecified essential hypertension 09/03/2013   Past Medical History:  Diagnosis Date  . 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 Hypertension   . Poor dental hygiene     Past Surgical History:  Procedure Laterality Date  . DESCENDING AORTIC resesection, replacement  12/1999   Hemashield Graft  . 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 Nyquil Multi-Symptom [Pseudoeph-Doxylamine-Dm-Apap] Itching and Rash    Social  History  Substance Use Topics  . Smoking status: Never Smoker  . Smokeless tobacco: Never Used  . Alcohol use No     Comment: quit in 2008    No family history on file.   Review of Systems  Constitutional: Negative.   HENT: Negative.   Eyes: Negative.   Respiratory: Negative.   Cardiovascular: Negative.   Gastrointestinal: Negative.   Genitourinary: Negative.   Musculoskeletal: Positive for back pain and joint pain.  Skin: Negative.   Neurological: Negative.   Endo/Heme/Allergies: Negative.   Psychiatric/Behavioral: Negative.     Objective:  Physical Exam  Vital signs in last 24 hours: @VSRANGES@  Labs:   Estimated body mass index is 26.45 kg/m as calculated from the following:   Height as of 12/01/15: 5' 1" (1.549 m).   Weight as of 12/01/15: 63.5 kg (140 lb).   Imaging Review Plain radiographs demonstrate severe degenerative joint disease of the right hip(s). The bone quality appears to be adequate for age and reported activity level.  Assessment/Plan:  End stage arthritis, right hip(s)  The patient history, physical examination, clinical judgement of the provider and imaging studies are consistent with end stage degenerative joint disease of the right hip(s) and total hip arthroplasty is deemed medically necessary. The treatment options including medical management, injection therapy, arthroscopy and arthroplasty were discussed at length. The risks and benefits of total hip arthroplasty were presented and reviewed. The risks due to aseptic loosening, infection, stiffness, dislocation/subluxation,  thromboembolic complications and other imponderables were discussed.  The patient acknowledged the explanation, agreed to proceed with the plan and consent was signed. Patient is being admitted for inpatient treatment for surgery, pain control,   PT, OT, prophylactic antibiotics, VTE prophylaxis, progressive ambulation and ADL's and discharge planning.The patient is planning to  be discharged home with home health services. (+) eliquis

## 2016-02-19 ENCOUNTER — Encounter (HOSPITAL_COMMUNITY): Payer: Self-pay | Admitting: Orthopedic Surgery

## 2016-02-19 LAB — CBC
HEMATOCRIT: 32.4 % — AB (ref 39.0–52.0)
HEMOGLOBIN: 11.4 g/dL — AB (ref 13.0–17.0)
MCH: 29.2 pg (ref 26.0–34.0)
MCHC: 35.2 g/dL (ref 30.0–36.0)
MCV: 83.1 fL (ref 78.0–100.0)
PLATELETS: 176 10*3/uL (ref 150–400)
RBC: 3.9 MIL/uL — AB (ref 4.22–5.81)
RDW: 12.7 % (ref 11.5–15.5)
WBC: 15.1 10*3/uL — AB (ref 4.0–10.5)

## 2016-02-19 LAB — BASIC METABOLIC PANEL
ANION GAP: 10 (ref 5–15)
BUN: 14 mg/dL (ref 6–20)
CO2: 23 mmol/L (ref 22–32)
Calcium: 7.7 mg/dL — ABNORMAL LOW (ref 8.9–10.3)
Chloride: 105 mmol/L (ref 101–111)
Creatinine, Ser: 0.97 mg/dL (ref 0.61–1.24)
GFR calc Af Amer: >60 mL/min (ref >60)
GLUCOSE: 202 mg/dL — AB (ref 65–99)
POTASSIUM: 3.5 mmol/L (ref 3.5–5.1)
Sodium: 138 mmol/L (ref 135–145)

## 2016-02-19 MED ORDER — SENNA 8.6 MG PO TABS: 2.0000 | ORAL_TABLET | Freq: Every day | ORAL | 0 refills | Status: DC

## 2016-02-19 MED ORDER — APIXABAN 2.5 MG PO TABS: 2.5000 mg | ORAL_TABLET | Freq: Two times a day (BID) | ORAL | 0 refills | Status: DC

## 2016-02-19 MED ORDER — HYDROCODONE-ACETAMINOPHEN 5-325 MG PO TABS: 1.0000 | ORAL_TABLET | ORAL | 0 refills | Status: DC | PRN

## 2016-02-19 MED ORDER — DOCUSATE SODIUM 100 MG PO CAPS: 100.0000 mg | ORAL_CAPSULE | Freq: Two times a day (BID) | ORAL | 0 refills | Status: DC

## 2016-02-19 MED ORDER — ONDANSETRON HCL 4 MG PO TABS: 4.0000 mg | ORAL_TABLET | Freq: Four times a day (QID) | ORAL | 0 refills | Status: DC | PRN

## 2016-02-19 NOTE — Discharge Summary (Signed)
Physician Discharge Summary  Patient ID: Dale Sanford MRN: [id number] DOB/AGE: 1950-10-23 65 y.o.  Admit date: 02/18/2016 Discharge date: 02/19/2016  Admission Diagnoses:  Avascular necrosis of hip, right [facility])  Discharge Diagnoses:  Principal Problem:   Avascular necrosis of hip, right [facility])   Past Medical History:  Diagnosis Date  . 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 GERD (gastroesophageal reflux disease)   . Hypertension   . Poor dental hygiene     Surgeries: Procedure(s): RIGHT TOTAL HIP ARTHROPLASTY ANTERIOR APPROACH on 02/18/2016   Consultants (if any):   Discharged Condition: Improved  Hospital Course: Dale Sanford is an 65 y.o. male who was admitted 02/18/2016 with a diagnosis of Avascular necrosis of hip, right (HCC) and went to the operating room on 02/18/2016 and underwent the above named procedures.    He was given perioperative antibiotics:  Anti-infectives    Start     Dose/Rate Route Frequency Ordered Stop   02/18/16 1900  ceFAZolin (ANCEF) IVPB 1 g/50 mL premix     1 g 100 mL/hr over 30 Minutes Intravenous Every 6 hours 02/18/16 1702 02/19/16 0241   02/18/16 1033  ceFAZolin (ANCEF) IVPB 2g/100 mL premix     2 g 200 mL/hr over 30 Minutes Intravenous On call to O.R. 02/18/16 1033 02/18/16 1251    .  He was given sequential compression devices, early ambulation, and apixaban for DVT prophylaxis.  He benefited maximally from the hospital stay and there were no complications.    Recent vital signs:  Vitals:   02/19/16 0942 02/19/16 1332  BP: (!) 148/55 (!) 166/58  Pulse: 73 77  Resp: 18 20  Temp: 98.4 F (36.9 C) 98.2 F (36.8 C)    Recent laboratory studies:  Lab Results  Component Value Date   HGB 11.4 (L) 02/19/2016   HGB 15.2 02/10/2016   HGB 16.6 04/27/2015   Lab Results  Component Value Date   WBC 15.1 (H) 02/19/2016   PLT 176 02/19/2016   No results  found for: INR Lab Results  Component Value Date   NA 138 02/19/2016   K 3.5 02/19/2016   CL 105 02/19/2016   CO2 23 02/19/2016   BUN 14 02/19/2016   CREATININE 0.97 02/19/2016   GLUCOSE 202 (H) 02/19/2016    Discharge Medications:     Medication List    STOP taking these medications   amLODipine 5 MG tablet Commonly known as:  NORVASC   aspirin 325 MG tablet     TAKE these medications   apixaban 2.5 MG Tabs tablet Commonly known as:  ELIQUIS Take 1 tablet (2.5 mg total) by mouth every 12 (twelve) hours.   docusate sodium 100 MG capsule Commonly known as:  COLACE Take 1 capsule (100 mg total) by mouth 2 (two) times daily.   HYDROcodone-acetaminophen 5-325 MG tablet Commonly known as:  NORCO/VICODIN Take 1-2 tablets by mouth every 4 (four) hours as needed (breakthrough pain).   metoprolol 100 MG tablet Commonly known as:  LOPRESSOR Take 1 tablet (100 mg total) by mouth 2 (two) times daily.   ondansetron 4 MG tablet Commonly known as:  ZOFRAN Take 1 tablet (4 mg total) by mouth every 6 (six) hours as needed for nausea.   senna 8.6 MG Tabs tablet Commonly known as:  SENOKOT Take 2 tablets (17.2 mg total) by mouth at bedtime.   TYLENOL ARTHRITIS PAIN 650 MG CR tablet  Generic drug:  acetaminophen Take 650 mg by mouth every 8 (eight) hours as needed for pain.       Diagnostic Studies: Dg Pelvis Portable  Result Date: 02/18/2016 CLINICAL DATA:  Status post right hip replacement EXAM: PORTABLE PELVIS 1-2 VIEWS COMPARISON:  None. FINDINGS: Right hip replacement is noted. No acute bony abnormality is seen. No soft tissue abnormality is noted. IMPRESSION: Status post right hip replacement. Electronically Signed   By: Alcide CleverMark  Lukens M.D.   On: 02/18/2016 15:39   Dg C-arm 61-120 Min-no Report  Result Date: 02/18/2016 CLINICAL DATA:  Right total hip replacement EXAM: DG C-ARM 61-120 MIN-NO REPORT; OPERATIVE RIGHT HIP WITH PELVIS COMPARISON:  None. FINDINGS: Two views of  the right hip submitted. There is right hip prosthesis with anatomic alignment. Fluoroscopy time was 5 minutes.  Please see the operative report. IMPRESSION: Right hip prosthesis with anatomic alignment. Fluoroscopy time 5 minutes.  Please see the operative report. Electronically Signed   By: Natasha MeadLiviu  Pop M.D.   On: 02/18/2016 14:47   Dg Hip Operative Unilat With Pelvis Right  Result Date: 02/18/2016 CLINICAL DATA:  Right total hip replacement EXAM: DG C-ARM 61-120 MIN-NO REPORT; OPERATIVE RIGHT HIP WITH PELVIS COMPARISON:  None. FINDINGS: Two views of the right hip submitted. There is right hip prosthesis with anatomic alignment. Fluoroscopy time was 5 minutes.  Please see the operative report. IMPRESSION: Right hip prosthesis with anatomic alignment. Fluoroscopy time 5 minutes.  Please see the operative report. Electronically Signed   By: Natasha MeadLiviu  Pop M.D.   On: 02/18/2016 14:47    Disposition: 01-Home or Self Care  Discharge Instructions    Call MD / Call 911    Complete by:  As directed    If you experience chest pain or shortness of breath, CALL 911 and be transported to the hospital emergency room.  If you develope a fever above 101 F, pus (white drainage) or increased drainage or redness at the wound, or calf pain, call your surgeon's office.   Constipation Prevention    Complete by:  As directed    Drink plenty of fluids.  Prune juice may be helpful.  You may use a stool softener, such as Colace (over the counter) 100 mg twice a day.  Use MiraLax (over the counter) for constipation as needed.   Diet - low sodium heart healthy    Complete by:  As directed    Increase activity slowly as tolerated    Complete by:  As directed    TED hose    Complete by:  As directed    Use stockings (TED hose) for 2 weeks on both leg(s).  You may remove them at night for sleeping.      Follow-up Information    Charrise Lardner, Cloyde ReamsBrian James, MD. Schedule an appointment as soon as possible for a visit in 2  week(s).   Specialty:  Orthopedic Surgery Why:  For wound re-check Contact information: [address] [place] [address] [phone]        [facility] Follow up.   Specialty:  Home Health Services Why:  physical therapy Contact information: [address] [place] [address] [place] [address] [phone]        [facility] Follow up.   Why:  rolling walker and 3n1 Contact information: [address] [place] [address] [phone]            Signed: Garnet KoyanagiSwinteck, Arali Somera James 02/20/2016, 2:03  PM

## 2016-02-19 NOTE — Progress Notes (Signed)
Physical Therapy Treatment Patient Details Name: Dale Sanford MRN: [id number] DOB: 1950/03/23 Today's Date: 02/19/2016    History of Present Illness Pt s/p R THR and with hx of aortic resection    PT Comments    Pt motivated and progressing well with mobility.  Reviewed stairs and car transfers.  Follow Up Recommendations  Home health PT     Equipment Recommendations  Rolling walker with 5" wheels    Recommendations for Other Services OT consult     Precautions / Restrictions Precautions Precautions: Fall Restrictions Weight Bearing Restrictions: No    Mobility  Bed Mobility               General bed mobility comments: pt in chair and declines back to bed  Transfers Overall transfer level: Needs assistance Equipment used: Rolling walker (2 wheeled) Transfers: Sit to/from Stand Sit to Stand: Supervision Stand pivot transfers: Supervision       General transfer comment: cues for LE management and use of UEs to self assist  Ambulation/Gait Ambulation/Gait assistance: Min guard;Supervision Ambulation Distance (Feet): 200 Feet Assistive device: Rolling walker (2 wheeled) Gait Pattern/deviations: Step-to pattern;Step-through pattern;Decreased step length - right;Decreased step length - left;Shuffle;Trunk flexed;Antalgic Gait velocity: decr Gait velocity interpretation: Below normal speed for age/gender General Gait Details: cues for posture, position from RW and initial sequence   Stairs Stairs: Yes Stairs assistance: Min assist Stair Management: No rails;Step to pattern;Forwards;With walker Number of Stairs: 4 General stair comments: 2 steps twice with RW at top of stairs and cues for sequence and foot placement  Wheelchair Mobility    Modified Rankin (Stroke Patients Only)       Balance                                    Cognition Arousal/Alertness: Awake/alert Behavior During Therapy: Impulsive;WFL for tasks  assessed/performed Overall Cognitive Status: Within Functional Limits for tasks assessed                      Exercises      General Comments        Pertinent Vitals/Pain Pain Assessment: Faces Faces Pain Scale: Hurts a little bit Pain Location: R hip Pain Descriptors / Indicators: Sore Pain Intervention(s): Limited activity within patient's tolerance;Monitored during session;Ice applied    Home Living Family/patient expects to be discharged to:: Private residence Living Arrangements: Parent;Children Available Help at Discharge: Family;Friend(s) Type of Home: House Home Access: Stairs to enter Entrance Stairs-Rails: None Home Layout: One level Home Equipment: None Additional Comments: Home with elderly mother and step-dtr.  Also has friend who will assist    Prior Function Level of Independence: Independent          PT Goals (current goals can now be found in the care plan section) Acute Rehab PT Goals Patient Stated Goal: Resume previous lifestyle with decreased pain PT Goal Formulation: With patient Time For Goal Achievement: 02/21/16 Potential to Achieve Goals: Good Progress towards PT goals: Progressing toward goals    Frequency    7X/week      PT Plan Current plan remains appropriate    Co-evaluation             End of Session Equipment Utilized During Treatment: Gait belt Activity Tolerance: Patient tolerated treatment well Patient left: in chair;with call bell/phone within reach;with family/visitor present;with chair alarm set     Time: [id number] PT  Time Calculation (min) (ACUTE ONLY): 23 min  Charges:  $Gait Training: 23-37 mins                    G Codes:      Dale Sanford Feb 26, 2016, 2:11 PM

## 2016-02-19 NOTE — Discharge Instructions (Signed)
°Dr. Brian Swinteck °Joint Replacement Specialist °[facility] °[address] °[place], [place] [address] °[phone] ° ° °TOTAL HIP REPLACEMENT POSTOPERATIVE DIRECTIONS ° ° ° °Hip Rehabilitation, Guidelines Following Surgery  ° °WEIGHT BEARING °Weight bearing as tolerated with assist device (walker, cane, etc) as directed, use it as long as suggested by your surgeon or therapist, typically at least 4-6 weeks. ° °The results of a hip operation are greatly improved after range of motion and muscle strengthening exercises. Follow all safety measures which are given to protect your hip. If any of these exercises cause increased pain or swelling in your joint, decrease the amount until you are comfortable again. Then slowly increase the exercises. Call your caregiver if you have problems or questions.  ° °HOME CARE INSTRUCTIONS  °Most of the following instructions are designed to prevent the dislocation of your new hip.  °Remove items at home which could result in a fall. This includes throw rugs or furniture in walking pathways.  °Continue medications as instructed at time of discharge. °· You may have some home medications which will be placed on hold until you complete the course of blood thinner medication. °· You may start showering once you are discharged home. Do not remove your dressing. °Do not put on socks or shoes without following the instructions of your caregivers.   °Sit on chairs with arms. Use the chair arms to help push yourself up when arising.  °Arrange for the use of a toilet seat elevator so you are not sitting low.  °· Walk with walker as instructed.  °You may resume a sexual relationship in one month or when given the OK by your caregiver.  °Use walker as long as suggested by your caregivers.  °You may put full weight on your legs and walk as much as is comfortable. °Avoid periods of inactivity such as sitting longer than an hour when not asleep. This helps prevent  blood clots.  °You may return to work once you are cleared by your surgeon.  °Do not drive a car for 6 weeks or until released by your surgeon.  °Do not drive while taking narcotics.  °Wear elastic stockings for two weeks following surgery during the day but you may remove then at night.  °Make sure you keep all of your appointments after your operation with all of your doctors and caregivers. You should call the office at the above phone number and make an appointment for approximately two weeks after the date of your surgery. °Please pick up a stool softener and laxative for home use as long as you are requiring pain medications. °· ICE to the affected hip every three hours for 30 minutes at a time and then as needed for pain and swelling. Continue to use ice on the hip for pain and swelling from surgery. You may notice swelling that will progress down to the foot and ankle.  This is normal after surgery.  Elevate the leg when you are not up walking on it.   °It is important for you to complete the blood thinner medication as prescribed by your doctor. °· Continue to use the breathing machine which will help keep your temperature down.  It is common for your temperature to cycle up and down following surgery, especially at night when you are not up moving around and exerting yourself.  The breathing machine keeps your lungs expanded and your temperature down. ° °RANGE OF MOTION AND STRENGTHENING EXERCISES  °These exercises are   designed to help you keep full movement of your hip joint. Follow your caregiver's or physical therapist's instructions. Perform all exercises about fifteen times, three times per day or as directed. Exercise both hips, even if you have had only one joint replacement. These exercises can be done on a training (exercise) mat, on the floor, on a table or on a bed. Use whatever works the best and is most comfortable for you. Use music or television while you are exercising so that the exercises  are a pleasant break in your day. This will make your life better with the exercises acting as a break in routine you can look forward to.  °Lying on your back, slowly slide your foot toward your buttocks, raising your knee up off the floor. Then slowly slide your foot back down until your leg is straight again.  °Lying on your back spread your legs as far apart as you can without causing discomfort.  °Lying on your side, raise your upper leg and foot straight up from the floor as far as is comfortable. Slowly lower the leg and repeat.  °Lying on your back, tighten up the muscle in the front of your thigh (quadriceps muscles). You can do this by keeping your leg straight and trying to raise your heel off the floor. This helps strengthen the largest muscle supporting your knee.  °Lying on your back, tighten up the muscles of your buttocks both with the legs straight and with the knee bent at a comfortable angle while keeping your heel on the floor.  ° °SKILLED REHAB INSTRUCTIONS: °If the patient is transferred to a skilled rehab facility following release from the hospital, a list of the current medications will be sent to the facility for the patient to continue.  When discharged from the skilled rehab facility, please have the facility set up the patient's Home Health Physical Therapy prior to being released. Also, the skilled facility will be responsible for providing the patient with their medications at time of release from the facility to include their pain medication and their blood thinner medication. If the patient is still at the rehab facility at time of the two week follow up appointment, the skilled rehab facility will also need to assist the patient in arranging follow up appointment in our office and any transportation needs. ° °MAKE SURE YOU:  °Understand these instructions.  °Will watch your condition.  °Will get help right away if you are not doing well or get worse. ° °Pick up stool softner and  laxative for home use following surgery while on pain medications. °Do not remove your dressing. °The dressing is waterproof--it is OK to take showers. °Continue to use ice for pain and swelling after surgery. °Do not use any lotions or creams on the incision until instructed by your surgeon. °Total Hip Protocol. ° °Information on my medicine - ELIQUIS® (apixaban) ° °This medication education was reviewed with me or my healthcare representative as part of my discharge preparation.  The pharmacist that spoke with me during my hospital stay was:  Kyliee Ortego Marshall, RPH ° °Why was Eliquis® prescribed for you? °Eliquis® was prescribed for you to reduce the risk of blood clots forming after orthopedic surgery.   ° °What do You need to know about Eliquis®? °Take your Eliquis® TWICE DAILY - one tablet in the morning and one tablet in the evening with or without food.  It would be best to take the dose about the same time each day. ° °  If you have difficulty swallowing the tablet whole please discuss with your pharmacist how to take the medication safely. ° °Take Eliquis® exactly as prescribed by your doctor and DO NOT stop taking Eliquis® without talking to the doctor who prescribed the medication.  Stopping without other medication to take the place of Eliquis® may increase your risk of developing a clot. ° °After discharge, you should have regular check-up appointments with your healthcare provider that is prescribing your Eliquis®. ° °What do you do if you miss a dose? °If a dose of ELIQUIS® is not taken at the scheduled time, take it as soon as possible on the same day and twice-daily administration should be resumed.  The dose should not be doubled to make up for a missed dose.  Do not take more than one tablet of ELIQUIS at the same time. ° °Important Safety Information °A possible side effect of Eliquis® is bleeding. You should call your healthcare provider right away if you experience any of the  following: °? Bleeding from an injury or your nose that does not stop. °? Unusual colored urine (red or dark brown) or unusual colored stools (red or black). °? Unusual bruising for unknown reasons. °? A serious fall or if you hit your head (even if there is no bleeding). ° °Some medicines may interact with Eliquis® and might increase your risk of bleeding or clotting while on Eliquis®. To help avoid this, consult your healthcare provider or pharmacist prior to using any new prescription or non-prescription medications, including herbals, vitamins, non-steroidal anti-inflammatory drugs (NSAIDs) and supplements. ° °This website has more information on Eliquis® (apixaban): http://www.eliquis.com/eliquis/home ° ° °

## 2016-02-19 NOTE — Progress Notes (Signed)
RN reviewed discharge instructions with pateint and family friend. All questions answered.   Paperwork and prescriptions given.   NT rolled patient down with all belongings to family car.

## 2016-02-19 NOTE — Evaluation (Signed)
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  Pt should progress to dc home with assist of family/friends and follow up HHPT.    Follow Up Recommendations Home health PT    Equipment Recommendations  Rolling walker with 5" wheels    Recommendations for Other Services OT consult     Precautions / Restrictions Precautions Precautions: Fall Restrictions Weight Bearing Restrictions: No      Mobility  Bed Mobility Overal bed mobility: Needs Assistance Bed Mobility: Supine to Sit     Supine to sit: Min guard     General bed mobility comments: saftey  Transfers Overall transfer level: Needs assistance Equipment used: Rolling walker (2 wheeled) Transfers: Sit to/from Stand Sit to Stand: Min guard         General transfer comment: cues for LE management and use of UEs to self assist  Ambulation/Gait Ambulation/Gait assistance: Min assist;Min guard Ambulation Distance (Feet): 147 Feet Assistive device: Rolling walker (2 wheeled) Gait Pattern/deviations: Step-to pattern;Step-through pattern;Decreased step length - right;Decreased step length - left;Shuffle;Trunk flexed;Antalgic Gait velocity: decr Gait velocity interpretation: Below normal speed for age/gender General Gait Details: cues for posture, position from RW and initial sequence  Stairs            Wheelchair Mobility    Modified Rankin (Stroke Patients Only)       Balance Overall balance assessment: Needs assistance Sitting-balance support: No upper extremity supported;Feet supported Sitting balance-Leahy Scale: Good     Standing balance support: No upper extremity supported Standing balance-Leahy Scale: Fair                                Pertinent Vitals/Pain Pain Assessment: Faces Faces Pain Scale: Hurts little more Pain Location: R hip Pain Descriptors / Indicators: Aching;Sore Pain Intervention(s): Limited activity within patient's tolerance;Monitored during session;Premedicated before session;Ice applied    Home Living Family/patient expects to be discharged to:: Private residence Living Arrangements: Parent;Children Available Help at Discharge: Family;Friend(s) Type of Home: House Home Access: Stairs to enter Entrance Stairs-Rails: None Entrance Stairs-Number of Steps: 2 Home Layout: One level Home Equipment: None Additional Comments: Home with elderly mother and step-dtr.  Also has friend who will assist    Prior Function Level of Independence: Independent               Hand Dominance        Extremity/Trunk Assessment   Upper Extremity Assessment: Overall WFL for tasks assessed           Lower Extremity Assessment: RLE deficits/detail RLE Deficits / Details: Strength at hip 3-/5 with AAROM at hip to 75 flex and 10 abd    Cervical / Trunk Assessment: Normal  Communication   Communication: HOH  Cognition Arousal/Alertness: Awake/alert Behavior During Therapy: Impulsive;WFL for tasks assessed/performed Overall Cognitive Status: Within Functional Limits for tasks assessed                      General Comments      Exercises Total Joint Exercises Ankle Circles/Pumps: AROM;Both;15 reps;Supine Quad Sets: AROM;Both;10 reps;Supine Heel Slides: AAROM;Right;20 reps;Supine Hip ABduction/ADduction: AAROM;Right;15 reps;Supine   Assessment/Plan    PT Assessment  Patient needs continued PT services  PT Problem List Decreased strength;Decreased range of motion;Decreased activity tolerance;Decreased balance;Decreased mobility;Decreased knowledge of use of DME;Pain          PT Treatment Interventions DME instruction;Gait training;Stair  training;Functional mobility training;Therapeutic activities;Therapeutic exercise;Patient/family education    PT Goals (Current goals can be found in the Care Plan section)  Acute Rehab PT Goals Patient Stated Goal: Resume previous lifestyle with decreased pain PT Goal Formulation: With patient Time For Goal Achievement: 02/21/16 Potential to Achieve Goals: Good    Frequency 7X/week   Barriers to discharge        Co-evaluation               End of Session Equipment Utilized During Treatment: Gait belt Activity Tolerance: Patient tolerated treatment well Patient left: in chair;with call bell/phone within reach;with family/visitor present;with chair alarm set Nurse Communication: Mobility status         Time: 0820-0850 PT Time Calculation (min) (ACUTE ONLY): 30 min   Charges:   PT Evaluation $PT Eval Low Complexity: 1 Procedure PT Treatments $Therapeutic Exercise: 8-22 mins   PT G Codes:        Aasiyah Auerbach 03/08/16, 9:03 AM

## 2016-02-19 NOTE — Progress Notes (Signed)
   Subjective:  Patient reports pain as mild to moderate.  No c/o. Denies N/V/CP/SOB. Waled in the hall this am.  Objective:   VITALS:   Vitals:   02/18/16 2001 02/18/16 2201 02/19/16 0150 02/19/16 0612  BP: (!) 129/52 130/62 124/69 (!) 153/48  Pulse: 79 89 68 65  Resp: 18 18 20 18   Temp: 97.9 F (36.6 C) 98.4 F (36.9 C) 98.6 F (37 C) 98.5 F (36.9 C)  TempSrc: Oral Oral Oral Oral  SpO2: 100% 100% 98% 99%  Weight:      Height:        NAD ABD soft Sensation intact distally Intact pulses distally Dorsiflexion/Plantar flexion intact Incision: dressing C/D/I Compartment soft    Lab Results  Component Value Date   WBC 15.1 (H) 02/19/2016   HGB 11.4 (L) 02/19/2016   HCT 32.4 (L) 02/19/2016   MCV 83.1 02/19/2016   PLT 176 02/19/2016   BMET    Component Value Date/Time   NA 138 02/19/2016 0359   K 3.5 02/19/2016 0359   CL 105 02/19/2016 0359   CO2 23 02/19/2016 0359   GLUCOSE 202 (H) 02/19/2016 0359   BUN 14 02/19/2016 0359   CREATININE 0.97 02/19/2016 0359   CALCIUM 7.7 (L) 02/19/2016 0359   GFRNONAA >60 02/19/2016 0359   GFRAA >60 02/19/2016 0359     Assessment/Plan: 1 Day Post-Op   Principal Problem:   Avascular necrosis of hip, right (HCC)   WBAT with walker DVT ppx: apixaban, SCDs, TEDs PO pain control PT/OT Dispo: D/C home with HHPT   Marcene Laskowski, Cloyde Reams 02/19/2016, 9:37 AM   Samson Frederic, MD Cell [phone]

## 2016-02-19 NOTE — Progress Notes (Signed)
Discharge planning, spoke with patient and friend at bedside. Have chosen [facility] for [facility]. Contacted [facility] for referral. Needs RW and 3-n-1, contacted [facility] to deliver to room. [phone]

## 2016-02-19 NOTE — Evaluation (Signed)
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 Pt will benefit from skilled OT to increase their safety and independence with ADL and functional mobility for ADL to facilitate discharge to venue listed below.       Follow Up Recommendations  Home health OT;Supervision/Assistance - 24 hour    Equipment Recommendations  3 in 1 bedside comode       Precautions / Restrictions Precautions Precautions: Fall Restrictions Weight Bearing Restrictions: No      Mobility Bed Mobility               General bed mobility comments: pt in chair  Transfers Overall transfer level: Needs assistance Equipment used: Rolling walker (2 wheeled) Transfers: Sit to/from [employer] Transfers Sit to Stand: Supervision Stand pivot transfers: Supervision       General transfer comment: cues for LE management and use of UEs to self assist         ADL Overall ADL's : Needs assistance/impaired Eating/Feeding: Set up;Sitting   Grooming: Set up;Sitting   Upper Body Bathing: Set up;Sitting   Lower Body Bathing: Min guard;Sit to/from stand;With adaptive equipment;Cueing for safety;Cueing for sequencing;Cueing for compensatory techniques;With caregiver independent assisting   Upper Body Dressing : Set up;Sitting   Lower Body Dressing: Minimal assistance;Cueing for sequencing;Cueing for compensatory techniques;Cueing for safety;With caregiver independent assisting;Sit to/from stand;With adaptive equipment   Toilet Transfer: Min guard;RW;Ambulation;Cueing for sequencing;Cueing for safety   Toileting- Clothing Manipulation and Hygiene: Supervision/safety;Sit to/from stand;Cueing for safety;Cueing for sequencing     Tub/Shower  Transfer Details (indicate cue type and reason): verbalized safety with caregiver Functional mobility during ADLs: Minimal assistance;Cueing for sequencing;Cueing for safety General ADL Comments: pt will benefit from [facility] to address ADL activity in the home               Pertinent Vitals/Pain Pain Assessment: Faces Faces Pain Scale: Hurts a little bit Pain Location: r hip Pain Descriptors / Indicators: Sore Pain Intervention(s): Monitored during session        Extremity/Trunk Assessment Upper Extremity Assessment Upper Extremity Assessment: Overall WFL for tasks assessed           Communication Communication Communication: HOH   Cognition Arousal/Alertness: Awake/alert Behavior During Therapy: Impulsive;WFL for tasks assessed/performed Overall Cognitive Status: Within Functional Limits for tasks assessed                                Home Living Family/patient expects to be discharged to:: Private residence Living Arrangements: Parent;Children Available Help at Discharge: Family;Friend(s) Type of Home: House Home Access: Stairs to enter [employer] of Steps: 2 Entrance Stairs-Rails: None Home Layout: One level               Home Equipment: None   Additional Comments: Home with elderly mother and step-dtr.  Also has friend who will assist      Prior Functioning/Environment Level of Independence: Independent                 OT Problem List: Decreased strength;Decreased knowledge of use of DME or AE;Decreased safety awareness      OT Goals(Current goals can be found in the care plan  section) Acute Rehab OT Goals Patient Stated Goal: Resume previous lifestyle with decreased pain  OT Frequency:                End of Session Equipment Utilized During Treatment: Rolling walker Nurse Communication: Mobility status  Activity Tolerance: Patient tolerated treatment well Patient left: in chair;with family/visitor present    Time: [id number] OT Time Calculation (min): 33 min Charges:  OT General Charges $OT Visit: 1 Procedure OT Evaluation $OT Eval Moderate Complexity: 1 Procedure OT Treatments $Self Care/Home Management : 8-22 mins G-Codes:    Einar CrowEDDING, [name] 02/19/2016, 12:22 PM

## 2016-02-19 NOTE — Addendum Note (Signed)
Addendum  created 02/19/16 3557 by Illene Silver, CRNA   Charge Capture section accepted

## 2016-03-02 DIAGNOSIS — Z96641 Presence of right artificial hip joint: Secondary | ICD-10-CM | POA: Diagnosis not present

## 2016-03-30 DIAGNOSIS — Z96641 Presence of right artificial hip joint: Secondary | ICD-10-CM | POA: Diagnosis not present

## 2016-08-09 ENCOUNTER — Other Ambulatory Visit: Payer: Self-pay | Admitting: *Deleted

## 2016-08-09 MED ORDER — METOPROLOL TARTRATE 100 MG PO TABS: 100.0000 mg | ORAL_TABLET | Freq: Two times a day (BID) | ORAL | 1 refills | Status: DC

## 2016-08-12 DIAGNOSIS — Z471 Aftercare following joint replacement surgery: Secondary | ICD-10-CM | POA: Diagnosis not present

## 2016-08-12 DIAGNOSIS — Z96641 Presence of right artificial hip joint: Secondary | ICD-10-CM | POA: Diagnosis not present

## 2016-08-12 DIAGNOSIS — M1611 Unilateral primary osteoarthritis, right hip: Secondary | ICD-10-CM | POA: Diagnosis not present

## 2016-09-26 DIAGNOSIS — R1031 Right lower quadrant pain: Secondary | ICD-10-CM | POA: Diagnosis not present

## 2016-09-26 DIAGNOSIS — R110 Nausea: Secondary | ICD-10-CM | POA: Diagnosis not present

## 2016-10-14 ENCOUNTER — Other Ambulatory Visit: Payer: Self-pay | Admitting: Physician Assistant

## 2016-10-27 ENCOUNTER — Telehealth: Payer: Self-pay | Admitting: *Deleted

## 2016-10-27 NOTE — Telephone Encounter (Addendum)
Patient called in to request a refill on amlodipine 5 mg however this med is not listed on his current med list. Per 02/19/16 hospital discharge he was supposed to stop taking it. Patient was unaware of this and has continued to take the medication. He would like to know should he continue to take it as he still has a few left? Or okay to come off of it? Please advise. Thanks, MI

## 2016-10-27 NOTE — Telephone Encounter (Signed)
Received automated message that memory is full - unable to leave VM.  Will try again later. 

## 2016-11-01 NOTE — Telephone Encounter (Signed)
Received automated message that memory is full - unable to leave VM.  Will try again later.

## 2016-11-07 NOTE — Telephone Encounter (Signed)
Left message to call back  

## 2016-11-16 NOTE — Telephone Encounter (Signed)
Received automated message that memory is full - unable to leave message.  Will try again later.

## 2016-11-23 NOTE — Telephone Encounter (Signed)
Letter sent to patient to contact the office.

## 2017-06-05 ENCOUNTER — Other Ambulatory Visit: Payer: Self-pay | Admitting: Cardiology

## 2017-06-08 NOTE — Telephone Encounter (Signed)
I spoke with patient. Informed patient that he would need to make an appt to continue to have Dr. Mayford Knife refill or follow up with his primary MD. Patient is scheduled with Dr. Mayford Knife on 08/02/17. No refill sent, patient stated he had enjoy tablets to last to his appt. Informed patient we would send a refill at that time. Patient verbalized understanding and thankful for the call.

## 2017-07-14 ENCOUNTER — Encounter: Payer: Self-pay | Admitting: Cardiology

## 2017-07-18 ENCOUNTER — Encounter: Payer: Self-pay | Admitting: Cardiology

## 2017-08-02 ENCOUNTER — Ambulatory Visit: Payer: BLUE CROSS/BLUE SHIELD | Admitting: Cardiology

## 2017-09-13 ENCOUNTER — Ambulatory Visit: Payer: BLUE CROSS/BLUE SHIELD | Admitting: Cardiology

## 2017-09-13 ENCOUNTER — Encounter: Payer: Self-pay | Admitting: Cardiology

## 2017-09-13 ENCOUNTER — Encounter

## 2017-09-13 ENCOUNTER — Encounter (INDEPENDENT_AMBULATORY_CARE_PROVIDER_SITE_OTHER): Payer: Self-pay

## 2017-09-13 VITALS — BP 150/60 | HR 67 | Ht 62.0 in | Wt 139.8 lb

## 2017-09-13 DIAGNOSIS — I420 Dilated cardiomyopathy: Secondary | ICD-10-CM | POA: Diagnosis not present

## 2017-09-13 DIAGNOSIS — I10 Essential (primary) hypertension: Secondary | ICD-10-CM | POA: Diagnosis not present

## 2017-09-13 DIAGNOSIS — I77810 Thoracic aortic ectasia: Secondary | ICD-10-CM

## 2017-09-13 DIAGNOSIS — I359 Nonrheumatic aortic valve disorder, unspecified: Secondary | ICD-10-CM

## 2017-09-13 HISTORY — DX: Thoracic aortic ectasia: I77.810

## 2017-09-13 HISTORY — DX: Dilated cardiomyopathy: I42.0

## 2017-09-13 LAB — BASIC METABOLIC PANEL
BUN/Creatinine Ratio: 10 (ref 10–24)
BUN: 9 mg/dL (ref 8–27)
CALCIUM: 8.3 mg/dL — AB (ref 8.6–10.2)
CHLORIDE: 106 mmol/L (ref 96–106)
CO2: 26 mmol/L (ref 20–29)
Creatinine, Ser: 0.91 mg/dL (ref 0.76–1.27)
GFR calc Af Amer: 99 mL/min/{1.73_m2} (ref >59)
GFR calc non Af Amer: 86 mL/min/{1.73_m2} (ref >59)
Glucose: 73 mg/dL (ref 65–99)
POTASSIUM: 3.5 mmol/L (ref 3.5–5.2)
Sodium: 144 mmol/L (ref 134–144)

## 2017-09-13 MED ORDER — ASPIRIN EC 81 MG PO TBEC: 81.0000 mg | DELAYED_RELEASE_TABLET | Freq: Every day | ORAL | 3 refills | Status: DC

## 2017-09-13 MED ORDER — LOSARTAN POTASSIUM 25 MG PO TABS: 25.0000 mg | ORAL_TABLET | Freq: Every day | ORAL | 11 refills | Status: DC

## 2017-09-13 MED ORDER — METOPROLOL TARTRATE 100 MG PO TABS: 100.0000 mg | ORAL_TABLET | Freq: Two times a day (BID) | ORAL | 3 refills | Status: DC

## 2017-09-13 NOTE — Patient Instructions (Signed)
Medication Instructions:  Your physician has recommended you make the following change in your medication:  DECREASE: Aspirin to 81 mg once a day  START: Losartan 25 mg (1 tablet) once a day   If you need a refill on your cardiac medications, please contact your pharmacy first.  Labwork: Today for kidney function test (BMET)   Testing/Procedures: None ordered   Follow-Up: Your physician wants you to follow-up in: 1 year with Dr. Mayford Knife. You will receive a reminder letter in the mail two months in advance. If you don't receive a letter, please call our office to schedule the follow-up appointment.  Any Other Special Instructions Will Be Listed Below (If Applicable). Losartan tablets What is this medicine? LOSARTAN (loe SAR tan) is used to treat high blood pressure and to reduce the risk of stroke in certain patients. This drug also slows the progression of kidney disease in patients with diabetes. This medicine may be used for other purposes; ask your health care provider or pharmacist if you have questions. COMMON BRAND NAME(S): Cozaar What should I tell my health care provider before I take this medicine? They need to know if you have any of these conditions: -heart failure -kidney or liver disease -an unusual or allergic reaction to losartan, other medicines, foods, dyes, or preservatives -pregnant or trying to get pregnant -breast-feeding How should I use this medicine? Take this medicine by mouth with a glass of water. Follow the directions on the prescription label. This medicine can be taken with or without food. Take your doses at regular intervals. Do not take your medicine more often than directed. Talk to your pediatrician regarding the use of this medicine in children. Special care may be needed. Overdosage: If you think you have taken too much of this medicine contact a poison control center or emergency room at once. NOTE: This medicine is only for you. Do not share this  medicine with others. What if I miss a dose? If you miss a dose, take it as soon as you can. If it is almost time for your next dose, take only that dose. Do not take double or extra doses. What may interact with this medicine? -blood pressure medicines -diuretics, especially triamterene, spironolactone, or amiloride -fluconazole -NSAIDs, medicines for pain and inflammation, like ibuprofen or naproxen -potassium salts or potassium supplements -rifampin This list may not describe all possible interactions. Give your health care provider a list of all the medicines, herbs, non-prescription drugs, or dietary supplements you use. Also tell them if you smoke, drink alcohol, or use illegal drugs. Some items may interact with your medicine. What should I watch for while using this medicine? Visit your doctor or health care professional for regular checks on your progress. Check your blood pressure as directed. Ask your doctor or health care professional what your blood pressure should be and when you should contact him or her. Call your doctor or health care professional if you notice an irregular or fast heart beat. Women should inform their doctor if they wish to become pregnant or think they might be pregnant. There is a potential for serious side effects to an unborn child, particularly in the second or third trimester. Talk to your health care professional or pharmacist for more information. You may get drowsy or dizzy. Do not drive, use machinery, or do anything that needs mental alertness until you know how this drug affects you. Do not stand or sit up quickly, especially if you are an older patient. This  reduces the risk of dizzy or fainting spells. Alcohol can make you more drowsy and dizzy. Avoid alcoholic drinks. Avoid salt substitutes unless you are told otherwise by your doctor or health care professional. Do not treat yourself for coughs, colds, or pain while you are taking this medicine  without asking your doctor or health care professional for advice. Some ingredients may increase your blood pressure. What side effects may I notice from receiving this medicine? Side effects that you should report to your doctor or health care professional as soon as possible: -confusion, dizziness, light headedness or fainting spells -decreased amount of urine passed -difficulty breathing or swallowing, hoarseness, or tightening of the throat -fast or irregular heart beat, palpitations, or chest pain -skin rash, itching -swelling of your face, lips, tongue, hands, or feet Side effects that usually do not require medical attention (report to your doctor or health care professional if they continue or are bothersome): -cough -decreased sexual function or desire -headache -nasal congestion or stuffiness -nausea or stomach pain -sore or cramping muscles This list may not describe all possible side effects. Call your doctor for medical advice about side effects. You may report side effects to FDA at 1-800-FDA-1088. Where should I keep my medicine? Keep out of the reach of children. Store at room temperature between 15 and 30 degrees C (59 and 86 degrees F). Protect from light. Keep container tightly closed. Throw away any unused medicine after the expiration date. NOTE: This sheet is a summary. It may not cover all possible information. If you have questions about this medicine, talk to your doctor, pharmacist, or health care provider.  2018 Elsevier/Gold Standard (2007-05-18 16:42:18)   Thank you for choosing [facility] [place]    Lyda Perone, RN  [phone]  If you need a refill on your cardiac medications before your next appointment, please call your pharmacy.

## 2017-09-13 NOTE — Progress Notes (Signed)
Cardiology Office Note:    Date:  09/13/2017   ID:  Dale Sanford, DOB 28-Oct-1950, MRN [id number]  PCP:  Clayborn Heron, MD  Cardiologist:  No primary care provider on file.    Referring MD: Clayborn Heron, MD   Chief Complaint  Patient presents with  . Cardiomyopathy  . Hypertension    History of Present Illness:    Dale Sanford is a 67 y.o. 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  2D echocardiogram 12/01/2015 showed mildly reduced LV systolic function with EF 40 to 45% with diffuse hypokinesis and grade 2 diastolic dysfunction.  He had a stable aortic valve bioprosthesis at that time and aortic root was mildly dilated at 39 mm.  Nuclear stress test for preoperative cardiac clearance showed an EF of 44% with no ischemia.  He has not followed up since then.  He is here today for followup and is doing well.  He denies any chest pain or pressure, SOB, DOE, PND, orthopnea, LE edema, dizziness, palpitations or syncope. He is compliant with his meds and is tolerating meds with no SE.     Past Medical History:  Diagnosis Date  . 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 DCM (dilated cardiomyopathy) (HCC) 09/13/2017   EF 40-45% by echo 2017 with nuclear stress test showing no ischemia  . Dilated aortic root (HCC) 09/13/2017   39mm by echo 2017  . GERD (gastroesophageal reflux disease)   . Hypertension   . Poor dental hygiene     Past Surgical History:  Procedure Laterality Date  . DESCENDING AORTIC resesection, replacement  12/1999   Hemashield Graft  . 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 TOTAL HIP ARTHROPLASTY Right 02/18/2016   Procedure: RIGHT TOTAL HIP  ARTHROPLASTY ANTERIOR APPROACH;  Surgeon: Samson Frederic, MD;  Location: [facility];  Service: Orthopedics;  Laterality: Right;    Current Medications: Current Meds  Medication Sig  . aspirin 325 MG tablet Take 325 mg by mouth daily.  . metoprolol tartrate (LOPRESSOR) 100 MG tablet Take 1 tablet (100 mg total) by mouth 2 (two) times daily.  . [DISCONTINUED] metoprolol tartrate (LOPRESSOR) 100 MG tablet Take 1 tablet (100 mg total) by mouth 2 (two) times daily.     Allergies:   Nyquil multi-symptom [pseudoeph-doxylamine-dm-apap]   Social History   Socioeconomic History  . Marital status: Married    Spouse name: Not on file  . Number of children: Not on file  . Years of education: Not on file  . Highest education level: Not on file  Occupational History  . Not on file  Social Needs  . Financial resource strain: Not on file  . Food insecurity:    Worry: Not on file    Inability: Not on file  . Transportation needs:    Medical: Not on file    Non-medical: Not on file  Tobacco Use  . Smoking status: Never Smoker  . Smokeless tobacco: Never Used  Substance and Sexual Activity  . Alcohol use: No    Comment: quit in 2008  . Drug use: No  . Sexual activity: Not on file  Lifestyle  . Physical activity:  Days per week: Not on file    Minutes per session: Not on file  . Stress: Not on file  Relationships  . Social connections:    Talks on phone: Not on file    Gets together: Not on file    Attends religious service: Not on file    Active member of club or organization: Not on file    Attends meetings of clubs or organizations: Not on file    Relationship status: Not on file  Other Topics Concern  . Not on file  Social History Narrative  . Not on file     Family History: The patient's family history is not on file.  ROS:   Please see the history of present illness.    ROS  All other systems reviewed and negative.   EKGs/Labs/Other Studies Reviewed:    The  following studies were reviewed today: none  EKG:  EKG is  ordered today.  The ekg ordered today demonstrates normal sinus rhythm at 67 bpm with left ventricular hypertrophy with QRS widening and ST-T wave abnormality in the inferior lateral leads likely secondary to repolarization of normality.  No change compared to EKG 2017.  Recent Labs: No results found for requested labs within last 8760 hours.   Recent Lipid Panel No results found for: CHOL, TRIG, HDL, CHOLHDL, VLDL, LDLCALC, LDLDIRECT  Physical Exam:    VS:  BP (!) 150/60   Pulse 67   Ht 5\' 2"  (1.575 m)   Wt 139 lb 12.8 oz (63.4 kg)   BMI 25.57 kg/m     Wt Readings from Last 3 Encounters:  09/13/17 139 lb 12.8 oz (63.4 kg)  02/18/16 136 lb (61.7 kg)  02/10/16 136 lb (61.7 kg)     GEN:  Well nourished, well developed in no acute distress HEENT: Normal NECK: No JVD; No carotid bruits LYMPHATICS: No lymphadenopathy CARDIAC: RRR, no murmurs, rubs, gallops RESPIRATORY:  Clear to auscultation without rales, wheezing or rhonchi  ABDOMEN: Soft, non-tender, non-distended MUSCULOSKELETAL:  No edema; No deformity  SKIN: Warm and dry NEUROLOGIC:  Alert and oriented x 3 PSYCHIATRIC:  Normal affect   ASSESSMENT:    1. Aortic valve disorder   2. Essential hypertension   3. Dilated aortic root (HCC)   4. DCM (dilated cardiomyopathy) (HCC)    PLAN:    In order of problems listed above:  1.  Aortic valve endocarditis  - complicated by  AI status post AVR with Ross procedure and descending aortic resection with replacement Hemashield graft in 2001.  He has not had an echo since 2017 so we will repeat 2D echocardiogram to make sure his aortic valve replacement is stable.  I have instructed him to decrease his aspirin to 81 mg daily.  2.  Hypertension -BP is borderline controlled on exam today.  He continue on Lopressor 100 mg twice daily.  I am adding losartan 25 mg daily.  I will check a bmet today.  3.  Dilated aortic root  -he is status post Ross procedure with descending aortic resection replacement with Hemashield graft in 2001.  I will repeat a 2D echo to reassess aortic root size.  It was 39 mm on by echo 2017.  4.  Dilated cardiomyopathy -EF 40 to 45% by echo in 2017.  Nuclear stress test showed no inducible ischemia at that time.  He has not had any exertional angina.  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  He will continue  on beta-blocker therapy.  I am going to add low-dose ACE inhibitor with losartan 25 mg daily.  I will repeat an echo to see what LV function appears.   Medication Adjustments/Labs and Tests Ordered: Current medicines are reviewed at length with the patient today.  Concerns regarding medicines are outlined above.  Orders Placed This Encounter  Procedures  . EKG 12-Lead   Meds ordered this encounter  Medications  . metoprolol tartrate (LOPRESSOR) 100 MG tablet    Sig: Take 1 tablet (100 mg total) by mouth 2 (two) times daily.    Dispense:  180 tablet    Refill:  3    Signed, Armanda Magic, MD  09/13/2017 9:52 AM    [facility]

## 2017-09-19 ENCOUNTER — Other Ambulatory Visit: Payer: Self-pay | Admitting: Cardiology

## 2017-09-19 MED ORDER — LOSARTAN POTASSIUM 25 MG PO TABS: 25.0000 mg | ORAL_TABLET | Freq: Every day | ORAL | 3 refills | Status: DC

## 2017-09-29 ENCOUNTER — Other Ambulatory Visit: Payer: Self-pay

## 2017-09-29 ENCOUNTER — Ambulatory Visit (HOSPITAL_COMMUNITY): Payer: BLUE CROSS/BLUE SHIELD | Attending: Cardiovascular Disease

## 2017-09-29 DIAGNOSIS — I083 Combined rheumatic disorders of mitral, aortic and tricuspid valves: Secondary | ICD-10-CM | POA: Diagnosis not present

## 2017-09-29 DIAGNOSIS — I77810 Thoracic aortic ectasia: Secondary | ICD-10-CM | POA: Diagnosis not present

## 2017-09-29 DIAGNOSIS — I38 Endocarditis, valve unspecified: Secondary | ICD-10-CM | POA: Diagnosis not present

## 2017-09-29 DIAGNOSIS — I420 Dilated cardiomyopathy: Secondary | ICD-10-CM

## 2017-10-04 ENCOUNTER — Telehealth: Payer: Self-pay

## 2017-10-04 NOTE — Telephone Encounter (Signed)
Notes recorded by Sigurd Sos, RN on 10/04/2017 at 12:45 PM EDT [id number] 7/17 ------

## 2017-10-10 ENCOUNTER — Telehealth: Payer: Self-pay

## 2017-10-10 DIAGNOSIS — I359 Nonrheumatic aortic valve disorder, unspecified: Secondary | ICD-10-CM

## 2017-10-10 NOTE — Telephone Encounter (Signed)
Per echo result note:  "Notes recorded by Quintella Reichert, MD on 09/29/2017 at 5:10 PM EDT Please let patient know that he echo showed moderate thickening of the heart muscle with mildly reduced systolic function with EF 40 to 45% and increased stiffness of the heart muscle. There was moderate aortic insufficiency and reduced RV function. Please repeat echo in 6 months to reassess aortic valve which is now moderately leaky status post Ross procedure."   Nursing has attempted to call patient several times and left messages but no contact has been made. Echo ordered to be scheduled in 6 months.  Letter sent to patient to contact the office for results.

## 2017-12-29 ENCOUNTER — Encounter: Payer: Self-pay | Admitting: Cardiology

## 2018-04-02 ENCOUNTER — Encounter: Payer: Self-pay | Admitting: Cardiology

## 2018-04-05 ENCOUNTER — Telehealth: Payer: Self-pay

## 2018-04-05 NOTE — Telephone Encounter (Signed)
New message    Just an FYI. We have made several attempts to contact this patient including sending a letter to schedule or reschedule their echocardiogram. We will be removing the patient from the echo WQ.   Thank you 

## 2018-05-01 ENCOUNTER — Encounter (INDEPENDENT_AMBULATORY_CARE_PROVIDER_SITE_OTHER): Payer: Self-pay

## 2018-05-01 ENCOUNTER — Ambulatory Visit (HOSPITAL_COMMUNITY): Payer: BLUE CROSS/BLUE SHIELD | Attending: Cardiology

## 2018-05-01 DIAGNOSIS — I359 Nonrheumatic aortic valve disorder, unspecified: Secondary | ICD-10-CM

## 2018-05-04 NOTE — Telephone Encounter (Signed)
Erroneous encounter

## 2018-05-04 NOTE — Telephone Encounter (Signed)
-----   Message from Quintella Reichert, MD sent at 05/01/2018  9:59 PM EST ----- Echo showed severely reduced LVF with EF 20-25%, moderately dilated LA, moderately leaky MV, S/P Ross procedure with moderate AR.  COmpared to prior echo LVF has significantly declined - please have patient set up to see extender to set up for right and left heart cath

## 2018-05-07 ENCOUNTER — Encounter: Payer: Self-pay | Admitting: Cardiology

## 2018-05-07 ENCOUNTER — Ambulatory Visit: Payer: BLUE CROSS/BLUE SHIELD | Admitting: Cardiology

## 2018-05-07 VITALS — BP 122/62 | HR 67 | Ht 62.0 in | Wt 135.0 lb

## 2018-05-07 DIAGNOSIS — I10 Essential (primary) hypertension: Secondary | ICD-10-CM | POA: Diagnosis not present

## 2018-05-07 DIAGNOSIS — I420 Dilated cardiomyopathy: Secondary | ICD-10-CM

## 2018-05-07 DIAGNOSIS — I77810 Thoracic aortic ectasia: Secondary | ICD-10-CM | POA: Diagnosis not present

## 2018-05-07 MED ORDER — METOPROLOL SUCCINATE ER 200 MG PO TB24: 200.0000 mg | ORAL_TABLET | Freq: Every day | ORAL | 3 refills | Status: DC

## 2018-05-07 NOTE — Patient Instructions (Signed)
Medication Instructions:  STOP: Metoprolol 100 mg twice a day  START: Metoprolol 200 mg daily  If you need a refill on your cardiac medications before your next appointment, please call your pharmacy.   Lab work: TODAY: BMET & CBC  If you have labs (blood work) drawn today and your tests are completely normal, you will receive your results only by: Marland Kitchen MyChart Message (if you have MyChart) OR . A paper copy in the mail If you have any lab test that is abnormal or we need to change your treatment, we will call you to review the results.  Testing/Procedures: Your physician has requested that you have a cardiac catheterization. Cardiac catheterization is used to diagnose and/or treat various heart conditions. Doctors may recommend this procedure for a number of different reasons. The most common reason is to evaluate chest pain. Chest pain can be a symptom of coronary artery disease (CAD), and cardiac catheterization can show whether plaque is narrowing or blocking your heart's arteries. This procedure is also used to evaluate the valves, as well as measure the blood flow and oxygen levels in different parts of your heart. For further information please visit [email or web address] Please follow instruction sheet, as given.   Follow-Up: Follow up with [name] on 05/24/2018 @ 1:30 PM  Any Other Special Instructions Will Be Listed Below (If Applicable).    [facility] CARDIOVASCULAR DIVISION [facility] ST OFFICE [name] [place] [address] Dept: [phone] Loc: [phone]  Dale Sanford  05/07/2018  You are scheduled for a Cardiac Catheterization on Friday, February 21 with Dr. Lorine Bears.  1. Please arrive at the [facility] (Main Entrance A) at [facility]: [address] [place], [place] [address] at 5:30 AM (This time is two hours before your procedure to ensure your preparation). Free valet parking  service is available.   Special note: Every effort is made to have your procedure done on time. Please understand that emergencies sometimes delay scheduled procedures.  2. Diet: Do not eat solid foods after midnight.  The patient may have clear liquids until 5am upon the day of the procedure.  3. Medication instructions in preparation for your procedure:  On the morning of your procedure, take your Aspirin and any morning medicines NOT listed above.  You may use sips of water.  5. Plan for one night stay--bring personal belongings. 6. Bring a current list of your medications and current insurance cards. 7. You MUST have a responsible person to drive you home. 8. Someone MUST be with you the first 24 hours after you arrive home or your discharge will be delayed. 9. Please wear clothes that are easy to get on and off and wear slip-on shoes.  Thank you for allowing [place] to care for you!   -- [name]

## 2018-05-07 NOTE — H&P (View-Only) (Signed)
Cardiology Office Note:    Date:  05/07/2018   ID:  Dale Sanford, DOB 1950/05/23, MRN [id number]  PCP:  Dale Sanford, Dale R, MD  Cardiologist:  Dale Magicraci Turner, MD  Referring MD: Dale Sanford, Dale R, MD   Chief Complaint  Patient presents with  . Follow-up    abnormal echocardiogram    History of Present Illness:    Dale Sanford is a 68 y.o. 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  2D echocardiogram 12/01/2015 showed mildly reduced LV systolic function with EF 40 to 45% with diffuse hypokinesis and grade 2 diastolic dysfunction.  He had a stable aortic valve bioprosthesis at that time and aortic root was mildly dilated at 39 mm.  Nuclear stress test for preoperative cardiac clearance showed an EF of 44% with no ischemia.  He was last seen in the office by Dr. Mayford Knifeurner on 09/13/2017 at which time he was asymptomatic.  He was ordered an echocardiogram to follow-up on his aortic valve replacement.  The patient had echocardiogram on 05/01/2018 LVEF decreased from 40-45% to 20-25% with moderate MR and moderate aortic regurgitation.  Dr. Mayford Knifeurner has recommended right and left heart cath.  Dale Sanford is here today with his daughter. He still works full time in maintenance. He says he gets tired at work esp when mowing the yard but no chest pressure/tightness. He admits to some DOE when more exertional and with walking up 1 flight of stairs. He has occ lightheadedness when first standing up. He has not fallen. No palpitations, orthopnea, PND, swelling.   Past Medical History:  Diagnosis Date  . 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 DCM (dilated cardiomyopathy) (HCC) 09/13/2017   EF 40-45%  by echo 2017 with nuclear stress test showing no ischemia  . Dilated aortic root (HCC) 09/13/2017   39mm by echo 2017  . GERD (gastroesophageal reflux disease)   . Hypertension   . Poor dental hygiene     Past Surgical History:  Procedure Laterality Date  . DESCENDING AORTIC resesection, replacement  12/1999   Hemashield Graft  . 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 TOTAL HIP ARTHROPLASTY Right 02/18/2016   Procedure: RIGHT TOTAL HIP ARTHROPLASTY ANTERIOR APPROACH;  Surgeon: Dale FredericBrian Swinteck, MD;  Location: [facility];  Service: Orthopedics;  Laterality: Right;    Current Medications: Current Meds  Medication Sig  . aspirin EC 81 MG tablet Take 1 tablet (81 mg total) by mouth daily.  Marland Kitchen. losartan (COZAAR) 25 MG tablet Take 1 tablet (25 mg total) by mouth daily.  . [DISCONTINUED] metoprolol tartrate (LOPRESSOR) 100 MG tablet Take 1 tablet (100 mg total) by mouth 2 (two) times daily.     Allergies:   Nyquil multi-symptom [pseudoeph-doxylamine-dm-apap]   Social History   Socioeconomic History  . Marital status: Married    Spouse name: Not on file  . Number of children: Not on file  . Years of education: Not on file  . Highest education level: Not on file  Occupational History  . Not on file  Social Needs  . Financial resource strain: Not on file  . Food insecurity:    Worry: Not on file    Inability:  Not on file  . Transportation needs:    Medical: Not on file    Non-medical: Not on file  Tobacco Use  . Smoking status: Never Smoker  . Smokeless tobacco: Never Used  Substance and Sexual Activity  . Alcohol use: No    Comment: quit in 2008  . Drug use: No  . Sexual activity: Not on file  Lifestyle  . Physical activity:    Days per week: Not on file    Minutes per session: Not on file  . Stress: Not on file  Relationships  . Social connections:    Talks on phone: Not on file    Gets together: Not on file    Attends religious  service: Not on file    Active member of club or organization: Not on file    Attends meetings of clubs or organizations: Not on file    Relationship status: Not on file  Other Topics Concern  . Not on file  Social History Narrative  . Not on file     Family History: The patient's family history includes CAD in his father; Cancer in his brother; Cirrhosis in his brother. ROS:   Please see the history of present illness.     All other systems reviewed and are negative.  EKGs/Labs/Other Studies Reviewed:    The following studies were reviewed today:  Echocardiogram 05/01/2018 IMPRESSIONS   1. The left ventricle has severely reduced systolic function of 20-25%. The cavity size was mildly increased. There is mild concentric left ventricular hypertrophy. Left ventricular diastolic Doppler parameters are consistent with pseudonormal Elevated  left ventricular end-diastolic pressure.  2. The right ventricle has normal systolic function. The cavity was normal. There is no increase in right ventricular wall thickness. Right ventricular systolic pressure is mildly elevated with an estimated pressure of 36 mmHg.  3. Left atrial size was moderately dilated.  4. The mitral valve is degenerative. There is mild thickening. Mitral valve regurgitation is moderate by color flow Doppler.  5. The tricuspid valve is normal in structure.  6. S/p Ross procedure with ascending aortic replacement with Hemashield.  7. Aortic valve regurgitation is moderate by color flow Doppler.  8. The pulmonic valve was normal in structure.  9. There is mild dilatation of the aortic root. 10. Right atrial size was mildly dilated.  FINDINGS  Left Ventricle: The left ventricle has severely reduced systolic function of 20-25%. The cavity size was mildly increased. There is mild concentric left ventricular hypertrophy. Left ventricular diastolic Doppler parameters are consistent with  pseudonormal (grade II) Elevated left  ventricular end-diastolic pressure Right Ventricle: The right ventricle has normal systolic function. The cavity was normal. There is no increase in right ventricular wall thickness. Right ventricular systolic pressure is mildly elevated with an estimated pressure of 28.0 mmHg. Left Atrium: left atrial size was moderately dilated Right Atrium: right atrial size was mildly dilated Interatrial Septum: No atrial level shunt detected by color flow Doppler. Pericardium: There is no evidence of pericardial effusion. Mitral Valve: The mitral valve is degenerative in appearance. There is mild thickening. Mitral valve regurgitation is moderate by color flow Doppler. Tricuspid Valve: The tricuspid valve is normal in structure. Tricuspid valve regurgitation is mild by color flow Doppler. Aortic Valve: The aortic valve is tricuspid Aortic valve regurgitation is moderate by color flow Doppler. Pulmonic Valve: The pulmonic valve was normal in structure. Pulmonic valve regurgitation is not visualized by color flow Doppler. Aorta: There is mild dilatation of the  aortic root measuring 40 mm. Venous: The inferior vena cava is normal in size with greater than 50% respiratory variability. Additional Findings: Since the prior study on 09/29/2017 LVEF has decreased from 40-45% to 20-25%.  EKG:  EKG is ordered today.  The ekg ordered today demonstrates sinus rhythm with 1st degree A-V block, 60 p.m., LAFB, LVH with repolarization abnormality, prolonged QT with QTC 482.  Recent Labs: 05/07/2018: BUN 12; Creatinine, Ser 1.06; Hemoglobin WILL FOLLOW; Platelets WILL FOLLOW; Potassium 4.4; Sodium 141   Recent Lipid Panel No results found for: CHOL, TRIG, HDL, CHOLHDL, VLDL, LDLCALC, LDLDIRECT  Physical Exam:    VS:  BP 122/62   Pulse 67   Ht 5\' 2"  (1.575 m)   Wt 135 lb (61.2 kg)   BMI 24.69 kg/m     Wt Readings from Last 3 Encounters:  05/07/18 135 lb (61.2 kg)  09/13/17 139 lb 12.8 oz (63.4 kg)  02/18/16 136 lb  (61.7 kg)     Physical Exam  Constitutional: He is oriented to person, place, and time. He appears well-developed and well-nourished. No distress.  HENT:  Head: Normocephalic and atraumatic.  Eyes: Pupils are equal, round, and reactive to light. Conjunctivae are normal.  Neck: Normal range of motion. Neck supple.  Cardiovascular: Normal rate, regular rhythm, normal heart sounds and intact distal pulses. Exam reveals no gallop and no friction rub.  No murmur heard. Crisp valve closure  Pulmonary/Chest: Effort normal and breath sounds normal. No respiratory distress. He has no wheezes. He has no rales.  Abdominal: Soft. Bowel sounds are normal.  Musculoskeletal: Normal range of motion.        General: No deformity or edema.  Neurological: He is alert and oriented to person, place, and time.  Skin: Skin is warm and dry.  Psychiatric: He has a normal mood and affect. His behavior is normal. Judgment and thought content normal.  Vitals reviewed.   ASSESSMENT:    1. DCM (dilated cardiomyopathy) (HCC)   2. Dilated aortic root (HCC)   3. Essential hypertension    PLAN:    In order of problems listed above:  Cardiomyopathy/newly reduced EF -echocardiogram on 05/01/2018 LVEF decreased from 40-45% to 20-25% with moderate MR and moderate aortic regurgitation.  Dr. Mayford Knife has recommended right and left heart cath. -No current signs of excess volume.  No chest discomfort the patient does have dyspnea on exertion. -Will schedule L&Sanford heart cath for Friday per pt preference.  The patient and his daughter understands that risks included but are not limited to stroke (1 in 1000), death (1 in 1000), kidney failure [usually temporary] (1 in 500), bleeding (1 in 200), allergic reaction [possibly serious] (1 in 200).  -Check BMet today. -Will switch metoprolol tartrate to Toprol XL.  -Plan to switch losartan to Entresto at post cath follow up if BP will tolerate.   Dilated aortic root -S/P Ross  procedure with descending aortic resection replacement with Hemashield graft in 2001. -Echocardiogram on 05/01/2018 shows moderate AR.  Hypertension -On metoprolol and losartan.  -Blood pressure is well controlled.  We will watch closely titration of heart failure medications..  Medication Adjustments/Labs and Tests Ordered: Current medicines are reviewed at length with the patient today.  Concerns regarding medicines are outlined above. Labs and tests ordered and medication changes are outlined in the patient instructions below:  Patient Instructions  Medication Instructions:  STOP: Metoprolol 100 mg twice a day  START: Metoprolol 200 mg daily  If you need a refill  on your cardiac medications before your next appointment, please call your pharmacy.   Lab work: TODAY: BMET & CBC  If you have labs (blood work) drawn today and your tests are completely normal, you will receive your results only by: Marland Kitchen MyChart Message (if you have MyChart) OR . A paper copy in the mail If you have any lab test that is abnormal or we need to change your treatment, we will call you to review the results.  Testing/Procedures: Your physician has requested that you have a cardiac catheterization. Cardiac catheterization is used to diagnose and/or treat various heart conditions. Doctors may recommend this procedure for a number of different reasons. The most common reason is to evaluate chest pain. Chest pain can be a symptom of coronary artery disease (CAD), and cardiac catheterization can show whether plaque is narrowing or blocking your heart's arteries. This procedure is also used to evaluate the valves, as well as measure the blood flow and oxygen levels in different parts of your heart. For further information please visit [email or web address] Please follow instruction sheet, as given.   Follow-Up: Follow up with [name] on 05/24/2018 @ 1:30 PM  Any Other Special Instructions Will Be Listed Below (If  Applicable).    [facility] CARDIOVASCULAR DIVISION [facility] ST OFFICE [name] [place] [address] Dept: [phone] Loc: [phone]  TYKWON CHRISLER  05/07/2018  You are scheduled for a Cardiac Catheterization on Friday, February 21 with Dr. Lorine Bears.  1. Please arrive at the [facility] (Main Entrance A) at [facility]: [address] [place], [place] [address] at 5:30 AM (This time is two hours before your procedure to ensure your preparation). Free valet parking service is available.   Special note: Every effort is made to have your procedure done on time. Please understand that emergencies sometimes delay scheduled procedures.  2. Diet: Do not eat solid foods after midnight.  The patient may have clear liquids until 5am upon the day of the procedure.  3. Medication instructions in preparation for your procedure:  On the morning of your procedure, take your Aspirin and any morning medicines NOT listed above.  You may use sips of water.  5. Plan for one night stay--bring personal belongings. 6. Bring a current list of your medications and current insurance cards. 7. You MUST have a responsible person to drive you home. 8. Someone MUST be with you the first 24 hours after you arrive home or your discharge will be delayed. 9. Please wear clothes that are easy to get on and off and wear slip-on shoes.  Thank you for allowing [place] to care for you!   -- [facility] Invasive Cardiovascular services       Signed, Berton Bon, NP  05/07/2018 5:02 PM    [facility]

## 2018-05-07 NOTE — Progress Notes (Signed)
Cardiology Office Note:    Date:  05/07/2018   ID:  Dale Sanford, DOB 01/04/1951, MRN [id number]  PCP:  Rankins, Victoria R, MD  Cardiologist:  [name], MD  Referring MD: Rankins, Victoria R, MD   Chief Complaint  Patient presents with  . Follow-up    abnormal echocardiogram    History of Present Illness:    Dale Sanford is a 68 y.o. 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  2D echocardiogram 12/01/2015 showed mildly reduced LV systolic function with EF 40 to 45% with diffuse hypokinesis and grade 2 diastolic dysfunction.  He had a stable aortic valve bioprosthesis at that time and aortic root was mildly dilated at 39 mm.  Nuclear stress test for preoperative cardiac clearance showed an EF of 44% with no ischemia.  He was last seen in the office by Dr. Turner on 09/13/2017 at which time he was asymptomatic.  He was ordered an echocardiogram to follow-up on his aortic valve replacement.  The patient had echocardiogram on 05/01/2018 LVEF decreased from 40-45% to 20-25% with moderate MR and moderate aortic regurgitation.  Dr. Turner has recommended right and left heart cath.  Mr. Dale Sanford is here today with his daughter. He still works full time in maintenance. He says he gets tired at work esp when mowing the yard but no chest pressure/tightness. He admits to some DOE when more exertional and with walking up 1 flight of stairs. He has occ lightheadedness when first standing up. He has not fallen. No palpitations, orthopnea, PND, swelling.   Past Medical History:  Diagnosis Date  . 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 DCM (dilated cardiomyopathy) (HCC) 09/13/2017   EF 40-45%  by echo 2017 with nuclear stress test showing no ischemia  . Dilated aortic root (HCC) 09/13/2017   39mm by echo 2017  . GERD (gastroesophageal reflux disease)   . Hypertension   . Poor dental hygiene     Past Surgical History:  Procedure Laterality Date  . DESCENDING AORTIC resesection, replacement  12/1999   Hemashield Graft  . 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 TOTAL HIP ARTHROPLASTY Right 02/18/2016   Procedure: RIGHT TOTAL HIP ARTHROPLASTY ANTERIOR APPROACH;  Surgeon: Brian Swinteck, MD;  Location: [facility];  Service: Orthopedics;  Laterality: Right;    Current Medications: Current Meds  Medication Sig  . aspirin EC 81 MG tablet Take 1 tablet (81 mg total) by mouth daily.  . losartan (COZAAR) 25 MG tablet Take 1 tablet (25 mg total) by mouth daily.  . [DISCONTINUED] metoprolol tartrate (LOPRESSOR) 100 MG tablet Take 1 tablet (100 mg total) by mouth 2 (two) times daily.     Allergies:   Nyquil multi-symptom [pseudoeph-doxylamine-dm-apap]   Social History   Socioeconomic History  . Marital status: Married    Spouse name: Not on file  . Number of children: Not on file  . Years of education: Not on file  . Highest education level: Not on file  Occupational History  . Not on file  Social Needs  . Financial resource strain: Not on file  . Food insecurity:    Worry: Not on file    Inability:   Not on file  . Transportation needs:    Medical: Not on file    Non-medical: Not on file  Tobacco Use  . Smoking status: Never Smoker  . Smokeless tobacco: Never Used  Substance and Sexual Activity  . Alcohol use: No    Comment: quit in 2008  . Drug use: No  . Sexual activity: Not on file  Lifestyle  . Physical activity:    Days per week: Not on file    Minutes per session: Not on file  . Stress: Not on file  Relationships  . Social connections:    Talks on phone: Not on file    Gets together: Not on file    Attends religious  service: Not on file    Active member of club or organization: Not on file    Attends meetings of clubs or organizations: Not on file    Relationship status: Not on file  Other Topics Concern  . Not on file  Social History Narrative  . Not on file     Family History: The patient's family history includes CAD in his father; Cancer in his brother; Cirrhosis in his brother. ROS:   Please see the history of present illness.     All other systems reviewed and are negative.  EKGs/Labs/Other Studies Reviewed:    The following studies were reviewed today:  Echocardiogram 05/01/2018 IMPRESSIONS   1. The left ventricle has severely reduced systolic function of 20-25%. The cavity size was mildly increased. There is mild concentric left ventricular hypertrophy. Left ventricular diastolic Doppler parameters are consistent with pseudonormal Elevated  left ventricular end-diastolic pressure.  2. The right ventricle has normal systolic function. The cavity was normal. There is no increase in right ventricular wall thickness. Right ventricular systolic pressure is mildly elevated with an estimated pressure of 36 mmHg.  3. Left atrial size was moderately dilated.  4. The mitral valve is degenerative. There is mild thickening. Mitral valve regurgitation is moderate by color flow Doppler.  5. The tricuspid valve is normal in structure.  6. S/p Ross procedure with ascending aortic replacement with Hemashield.  7. Aortic valve regurgitation is moderate by color flow Doppler.  8. The pulmonic valve was normal in structure.  9. There is mild dilatation of the aortic root. 10. Right atrial size was mildly dilated.  FINDINGS  Left Ventricle: The left ventricle has severely reduced systolic function of 20-25%. The cavity size was mildly increased. There is mild concentric left ventricular hypertrophy. Left ventricular diastolic Doppler parameters are consistent with  pseudonormal (grade II) Elevated left  ventricular end-diastolic pressure Right Ventricle: The right ventricle has normal systolic function. The cavity was normal. There is no increase in right ventricular wall thickness. Right ventricular systolic pressure is mildly elevated with an estimated pressure of 28.0 mmHg. Left Atrium: left atrial size was moderately dilated Right Atrium: right atrial size was mildly dilated Interatrial Septum: No atrial level shunt detected by color flow Doppler. Pericardium: There is no evidence of pericardial effusion. Mitral Valve: The mitral valve is degenerative in appearance. There is mild thickening. Mitral valve regurgitation is moderate by color flow Doppler. Tricuspid Valve: The tricuspid valve is normal in structure. Tricuspid valve regurgitation is mild by color flow Doppler. Aortic Valve: The aortic valve is tricuspid Aortic valve regurgitation is moderate by color flow Doppler. Pulmonic Valve: The pulmonic valve was normal in structure. Pulmonic valve regurgitation is not visualized by color flow Doppler. Aorta: There is mild dilatation of the  aortic root measuring 40 mm. Venous: The inferior vena cava is normal in size with greater than 50% respiratory variability. Additional Findings: Since the prior study on 09/29/2017 LVEF has decreased from 40-45% to 20-25%.  EKG:  EKG is ordered today.  The ekg ordered today demonstrates sinus rhythm with 1st degree A-V block, 60 p.m., LAFB, LVH with repolarization abnormality, prolonged QT with QTC 482.  Recent Labs: 05/07/2018: BUN 12; Creatinine, Ser 1.06; Hemoglobin WILL FOLLOW; Platelets WILL FOLLOW; Potassium 4.4; Sodium 141   Recent Lipid Panel No results found for: CHOL, TRIG, HDL, CHOLHDL, VLDL, LDLCALC, LDLDIRECT  Physical Exam:    VS:  BP 122/62   Pulse 67   Ht 5\' 2"  (1.575 m)   Wt 135 lb (61.2 kg)   BMI 24.69 kg/m     Wt Readings from Last 3 Encounters:  05/07/18 135 lb (61.2 kg)  09/13/17 139 lb 12.8 oz (63.4 kg)  02/18/16 136 lb  (61.7 kg)     Physical Exam  Constitutional: He is oriented to person, place, and time. He appears well-developed and well-nourished. No distress.  HENT:  Head: Normocephalic and atraumatic.  Eyes: Pupils are equal, round, and reactive to light. Conjunctivae are normal.  Neck: Normal range of motion. Neck supple.  Cardiovascular: Normal rate, regular rhythm, normal heart sounds and intact distal pulses. Exam reveals no gallop and no friction rub.  No murmur heard. Crisp valve closure  Pulmonary/Chest: Effort normal and breath sounds normal. No respiratory distress. He has no wheezes. He has no rales.  Abdominal: Soft. Bowel sounds are normal.  Musculoskeletal: Normal range of motion.        General: No deformity or edema.  Neurological: He is alert and oriented to person, place, and time.  Skin: Skin is warm and dry.  Psychiatric: He has a normal mood and affect. His behavior is normal. Judgment and thought content normal.  Vitals reviewed.   ASSESSMENT:    1. DCM (dilated cardiomyopathy) (HCC)   2. Dilated aortic root (HCC)   3. Essential hypertension    PLAN:    In order of problems listed above:  Cardiomyopathy/newly reduced EF -echocardiogram on 05/01/2018 LVEF decreased from 40-45% to 20-25% with moderate MR and moderate aortic regurgitation.  Dr. Mayford Knife has recommended right and left heart cath. -No current signs of excess volume.  No chest discomfort the patient does have dyspnea on exertion. -Will schedule L&Sanford heart cath for Friday per pt preference.  The patient and his daughter understands that risks included but are not limited to stroke (1 in 1000), death (1 in 1000), kidney failure [usually temporary] (1 in 500), bleeding (1 in 200), allergic reaction [possibly serious] (1 in 200).  -Check BMet today. -Will switch metoprolol tartrate to Toprol XL.  -Plan to switch losartan to Entresto at post cath follow up if BP will tolerate.   Dilated aortic root -S/P Ross  procedure with descending aortic resection replacement with Hemashield graft in 2001. -Echocardiogram on 05/01/2018 shows moderate AR.  Hypertension -On metoprolol and losartan.  -Blood pressure is well controlled.  We will watch closely titration of heart failure medications..  Medication Adjustments/Labs and Tests Ordered: Current medicines are reviewed at length with the patient today.  Concerns regarding medicines are outlined above. Labs and tests ordered and medication changes are outlined in the patient instructions below:  Patient Instructions  Medication Instructions:  STOP: Metoprolol 100 mg twice a day  START: Metoprolol 200 mg daily  If you need a refill  on your cardiac medications before your next appointment, please call your pharmacy.   Lab work: TODAY: BMET & CBC  If you have labs (blood work) drawn today and your tests are completely normal, you will receive your results only by: Marland Kitchen MyChart Message (if you have MyChart) OR . A paper copy in the mail If you have any lab test that is abnormal or we need to change your treatment, we will call you to review the results.  Testing/Procedures: Your physician has requested that you have a cardiac catheterization. Cardiac catheterization is used to diagnose and/or treat various heart conditions. Doctors may recommend this procedure for a number of different reasons. The most common reason is to evaluate chest pain. Chest pain can be a symptom of coronary artery disease (CAD), and cardiac catheterization can show whether plaque is narrowing or blocking your heart's arteries. This procedure is also used to evaluate the valves, as well as measure the blood flow and oxygen levels in different parts of your heart. For further information please visit [email or web address] Please follow instruction sheet, as given.   Follow-Up: Follow up with [name] on 05/24/2018 @ 1:30 PM  Any Other Special Instructions Will Be Listed Below (If  Applicable).    [facility] CARDIOVASCULAR DIVISION [facility] ST OFFICE [name] [place] [address] Dept: [phone] Loc: [phone]  TYKWON CHRISLER  05/07/2018  You are scheduled for a Cardiac Catheterization on Friday, February 21 with Dr. Lorine Bears.  1. Please arrive at the [facility] (Main Entrance A) at [facility]: [address] [place], [place] [address] at 5:30 AM (This time is two hours before your procedure to ensure your preparation). Free valet parking service is available.   Special note: Every effort is made to have your procedure done on time. Please understand that emergencies sometimes delay scheduled procedures.  2. Diet: Do not eat solid foods after midnight.  The patient may have clear liquids until 5am upon the day of the procedure.  3. Medication instructions in preparation for your procedure:  On the morning of your procedure, take your Aspirin and any morning medicines NOT listed above.  You may use sips of water.  5. Plan for one night stay--bring personal belongings. 6. Bring a current list of your medications and current insurance cards. 7. You MUST have a responsible person to drive you home. 8. Someone MUST be with you the first 24 hours after you arrive home or your discharge will be delayed. 9. Please wear clothes that are easy to get on and off and wear slip-on shoes.  Thank you for allowing [place] to care for you!   -- [facility] Invasive Cardiovascular services       Signed, Berton Bon, NP  05/07/2018 5:02 PM    [facility]

## 2018-05-08 LAB — CBC
HEMATOCRIT: 46.1 % (ref 37.5–51.0)
HEMOGLOBIN: 15.6 g/dL (ref 13.0–17.7)
MCH: 28.5 pg (ref 26.6–33.0)
MCHC: 33.8 g/dL (ref 31.5–35.7)
MCV: 84 fL (ref 79–97)
Platelets: 215 10*3/uL (ref 150–450)
RBC: 5.48 x10E6/uL (ref 4.14–5.80)
RDW: 13.4 % (ref 11.6–15.4)
WBC: 9.7 10*3/uL (ref 3.4–10.8)

## 2018-05-08 LAB — BASIC METABOLIC PANEL
BUN/Creatinine Ratio: 11 (ref 10–24)
BUN: 12 mg/dL (ref 8–27)
CO2: 24 mmol/L (ref 20–29)
CREATININE: 1.06 mg/dL (ref 0.76–1.27)
Calcium: 8.8 mg/dL (ref 8.6–10.2)
Chloride: 104 mmol/L (ref 96–106)
GFR, EST AFRICAN AMERICAN: 82 mL/min/{1.73_m2} (ref >59)
GFR, EST NON AFRICAN AMERICAN: 71 mL/min/{1.73_m2} (ref >59)
Glucose: 83 mg/dL (ref 65–99)
POTASSIUM: 4.4 mmol/L (ref 3.5–5.2)
SODIUM: 141 mmol/L (ref 134–144)

## 2018-05-10 ENCOUNTER — Telehealth: Payer: Self-pay | Admitting: *Deleted

## 2018-05-10 NOTE — Telephone Encounter (Signed)
Pt contacted pre-catheterization scheduled at [facility] for: Friday May 11, 2018 7:30 AM Verified arrival time and place: [facility] Main Entrance A at: 5:30 AM  No solid food after midnight prior to cath, clear liquids until 5 AM day of procedure. Contrast allergy: no   AM meds can be  taken pre-cath with sip of water including: ASA 81 mg  Confirm patient has responsible person to drive home post procedure and observe 24 hours after arriving home.  Unable to reach patient by telephone, did not leave a message.

## 2018-05-11 ENCOUNTER — Ambulatory Visit (HOSPITAL_COMMUNITY)
Admission: RE | Admit: 2018-05-11 | Discharge: 2018-05-11 | Disposition: A | Discharge status: Home / Self Care | Payer: BLUE CROSS/BLUE SHIELD | Attending: Cardiovascular Disease | Admitting: Cardiovascular Disease

## 2018-05-11 ENCOUNTER — Encounter (HOSPITAL_COMMUNITY)
Admission: RE | Disposition: A | Discharge status: Home / Self Care | Payer: Self-pay | Source: Home / Self Care | Attending: Cardiovascular Disease

## 2018-05-11 DIAGNOSIS — Z888 Allergy status to other drugs, medicaments and biological substances status: Secondary | ICD-10-CM | POA: Diagnosis not present

## 2018-05-11 DIAGNOSIS — Z7982 Long term (current) use of aspirin: Secondary | ICD-10-CM | POA: Diagnosis not present

## 2018-05-11 DIAGNOSIS — I420 Dilated cardiomyopathy: Secondary | ICD-10-CM | POA: Diagnosis not present

## 2018-05-11 DIAGNOSIS — Z8249 Family history of ischemic heart disease and other diseases of the circulatory system: Secondary | ICD-10-CM | POA: Diagnosis not present

## 2018-05-11 DIAGNOSIS — I080 Rheumatic disorders of both mitral and aortic valves: Secondary | ICD-10-CM | POA: Insufficient documentation

## 2018-05-11 DIAGNOSIS — I428 Other cardiomyopathies: Secondary | ICD-10-CM | POA: Insufficient documentation

## 2018-05-11 DIAGNOSIS — I272 Pulmonary hypertension, unspecified: Secondary | ICD-10-CM | POA: Diagnosis not present

## 2018-05-11 DIAGNOSIS — I5023 Acute on chronic systolic (congestive) heart failure: Secondary | ICD-10-CM

## 2018-05-11 DIAGNOSIS — R931 Abnormal findings on diagnostic imaging of heart and coronary circulation: Secondary | ICD-10-CM | POA: Insufficient documentation

## 2018-05-11 DIAGNOSIS — I110 Hypertensive heart disease with heart failure: Secondary | ICD-10-CM | POA: Diagnosis not present

## 2018-05-11 DIAGNOSIS — Z79899 Other long term (current) drug therapy: Secondary | ICD-10-CM | POA: Insufficient documentation

## 2018-05-11 DIAGNOSIS — Z952 Presence of prosthetic heart valve: Secondary | ICD-10-CM | POA: Diagnosis not present

## 2018-05-11 DIAGNOSIS — K219 Gastro-esophageal reflux disease without esophagitis: Secondary | ICD-10-CM | POA: Diagnosis not present

## 2018-05-11 HISTORY — PX: RIGHT/LEFT HEART CATH AND CORONARY ANGIOGRAPHY: CATH118266

## 2018-05-11 LAB — POCT I-STAT 7, (LYTES, BLD GAS, ICA,H+H)
Acid-base deficit: 4 mmol/L — ABNORMAL HIGH (ref 0.0–2.0)
Bicarbonate: 20.9 mmol/L (ref 20.0–28.0)
Calcium, Ion: 1.17 mmol/L (ref 1.15–1.40)
HCT: 36 % — ABNORMAL LOW (ref 39.0–52.0)
Hemoglobin: 12.2 g/dL — ABNORMAL LOW (ref 13.0–17.0)
O2 Saturation: 94 %
PH ART: 7.347 — AB (ref 7.350–7.450)
Potassium: 4 mmol/L (ref 3.5–5.1)
Sodium: 140 mmol/L (ref 135–145)
TCO2: 22 mmol/L (ref 22–32)
pCO2 arterial: 38 mmHg (ref 32.0–48.0)
pO2, Arterial: 73 mmHg — ABNORMAL LOW (ref 83.0–108.0)

## 2018-05-11 SURGERY — RIGHT/LEFT HEART CATH AND CORONARY ANGIOGRAPHY
Anesthesia: LOCAL

## 2018-05-11 MED ORDER — LIDOCAINE HCL (PF) 1 % IJ SOLN
INTRAMUSCULAR | Status: DC | PRN
  Administered 2018-05-11 (×2): 2 mL

## 2018-05-11 MED ORDER — LIDOCAINE HCL (PF) 1 % IJ SOLN
INTRAMUSCULAR | Status: AC
  Filled 2018-05-11: qty 30

## 2018-05-11 MED ORDER — SODIUM CHLORIDE 0.9 % WEIGHT BASED INFUSION
3.0000 mL/kg/h | INTRAVENOUS | Status: AC
  Administered 2018-05-11: 3 mL/kg/h via INTRAVENOUS

## 2018-05-11 MED ORDER — HEPARIN (PORCINE) IN NACL 1000-0.9 UT/500ML-% IV SOLN
INTRAVENOUS | Status: DC | PRN
  Administered 2018-05-11 (×2): 500 mL

## 2018-05-11 MED ORDER — MIDAZOLAM HCL 2 MG/2ML IJ SOLN
INTRAMUSCULAR | Status: AC
  Filled 2018-05-11: qty 2

## 2018-05-11 MED ORDER — IOHEXOL 350 MG/ML SOLN
INTRAVENOUS | Status: DC | PRN
  Administered 2018-05-11: 40 mL via INTRAVENOUS

## 2018-05-11 MED ORDER — ONDANSETRON HCL 4 MG/2ML IJ SOLN: 4.0000 mg | Freq: Four times a day (QID) | INTRAMUSCULAR | Status: DC | PRN

## 2018-05-11 MED ORDER — SODIUM CHLORIDE 0.9 % IV SOLN: 250.0000 mL | INTRAVENOUS | Status: DC | PRN

## 2018-05-11 MED ORDER — SODIUM CHLORIDE 0.9% FLUSH: 3.0000 mL | Freq: Two times a day (BID) | INTRAVENOUS | Status: DC

## 2018-05-11 MED ORDER — HEPARIN SODIUM (PORCINE) 1000 UNIT/ML IJ SOLN
INTRAMUSCULAR | Status: DC | PRN
  Administered 2018-05-11: 3000 [IU] via INTRAVENOUS

## 2018-05-11 MED ORDER — MIDAZOLAM HCL 2 MG/2ML IJ SOLN
INTRAMUSCULAR | Status: DC | PRN
  Administered 2018-05-11: 1 mg via INTRAVENOUS

## 2018-05-11 MED ORDER — FENTANYL CITRATE (PF) 100 MCG/2ML IJ SOLN
INTRAMUSCULAR | Status: DC | PRN
  Administered 2018-05-11: 25 ug via INTRAVENOUS

## 2018-05-11 MED ORDER — ASPIRIN 81 MG PO CHEW
81.0000 mg | CHEWABLE_TABLET | ORAL | Status: AC
  Administered 2018-05-11: 81 mg via ORAL
  Filled 2018-05-11: qty 1

## 2018-05-11 MED ORDER — SODIUM CHLORIDE 0.9 % WEIGHT BASED INFUSION: 1.0000 mL/kg/h | INTRAVENOUS | Status: DC

## 2018-05-11 MED ORDER — SODIUM CHLORIDE 0.9% FLUSH: 3.0000 mL | INTRAVENOUS | Status: DC | PRN

## 2018-05-11 MED ORDER — VERAPAMIL HCL 2.5 MG/ML IV SOLN
INTRAVENOUS | Status: AC
  Filled 2018-05-11: qty 2

## 2018-05-11 MED ORDER — HEPARIN (PORCINE) IN NACL 1000-0.9 UT/500ML-% IV SOLN
INTRAVENOUS | Status: AC
  Filled 2018-05-11: qty 1000

## 2018-05-11 MED ORDER — ACETAMINOPHEN 325 MG PO TABS: 650.0000 mg | ORAL_TABLET | ORAL | Status: DC | PRN

## 2018-05-11 MED ORDER — SODIUM CHLORIDE 0.9 % IV SOLN: INTRAVENOUS | Status: DC

## 2018-05-11 MED ORDER — VERAPAMIL HCL 2.5 MG/ML IV SOLN
INTRAVENOUS | Status: DC | PRN
  Administered 2018-05-11: 10 mL via INTRA_ARTERIAL

## 2018-05-11 MED ORDER — FENTANYL CITRATE (PF) 100 MCG/2ML IJ SOLN
INTRAMUSCULAR | Status: AC
  Filled 2018-05-11: qty 2

## 2018-05-11 SURGICAL SUPPLY — 11 items
CATH BALLN WEDGE 5F 110CM (CATHETERS) ×1 IMPLANT
CATH INFINITI 5FR JK (CATHETERS) ×1 IMPLANT
DEVICE RAD COMP TR BAND LRG (VASCULAR PRODUCTS) ×1 IMPLANT
GLIDESHEATH SLEND SS 6F .021 (SHEATH) ×1 IMPLANT
GUIDEWIRE INQWIRE 1.5J.035X260 (WIRE) ×1 IMPLANT
INQWIRE 1.5J .035X260CM (WIRE) ×2
KIT HEART LEFT (KITS) ×2 IMPLANT
PACK CARDIAC CATHETERIZATION (CUSTOM PROCEDURE TRAY) ×2 IMPLANT
SHEATH GLIDE SLENDER 4/5FR (SHEATH) ×1 IMPLANT
TRANSDUCER W/STOPCOCK (MISCELLANEOUS) ×2 IMPLANT
TUBING CIL FLEX 10 FLL-RA (TUBING) ×2 IMPLANT

## 2018-05-11 NOTE — Progress Notes (Signed)
TRB removed from right wrist, 2x2 gauze with tegaderm placed, armboard placed, site unremarkable.  Pt ambulatory to bathroom voided, tolerated well.  Will continue to monitor

## 2018-05-11 NOTE — Discharge Instructions (Signed)
Radial Site Care ° °This sheet gives you information about how to care for yourself after your procedure. Your health care provider may also give you more specific instructions. If you have problems or questions, contact your health care provider. °What can I expect after the procedure? °After the procedure, it is common to have: °· Bruising and tenderness at the catheter insertion area. °Follow these instructions at home: °Medicines °· Take over-the-counter and prescription medicines only as told by your health care provider. °Insertion site care °· Follow instructions from your health care provider about how to take care of your insertion site. Make sure you: °? Wash your hands with soap and water before you change your bandage (dressing). If soap and water are not available, use hand sanitizer. °? Change your dressing as told by your health care provider. °? Leave stitches (sutures), skin glue, or adhesive strips in place. These skin closures may need to stay in place for 2 weeks or longer. If adhesive strip edges start to loosen and curl up, you may trim the loose edges. Do not remove adhesive strips completely unless your health care provider tells you to do that. °· Check your insertion site every day for signs of infection. Check for: °? Redness, swelling, or pain. °? Fluid or blood. °? Pus or a bad smell. °? Warmth. °· Do not take baths, swim, or use a hot tub until your health care provider approves. °· You may shower 24-48 hours after the procedure, or as directed by your health care provider. °? Remove the dressing and gently wash the site with plain soap and water. °? Pat the area dry with a clean towel. °? Do not rub the site. That could cause bleeding. °· Do not apply powder or lotion to the site. °Activity ° °· For 24 hours after the procedure, or as directed by your health care provider: °? Do not flex or bend the affected arm. °? Do not push or pull heavy objects with the affected arm. °? Do not  drive yourself home from the hospital or clinic. You may drive 24 hours after the procedure unless your health care provider tells you not to. °? Do not operate machinery or power tools. °· Do not lift anything that is heavier than 10 lb (4.5 kg), or the limit that you are told, until your health care provider says that it is safe. °· Ask your health care provider when it is okay to: °? Return to work or school. °? Resume usual physical activities or sports. °? Resume sexual activity. °General instructions °· If the catheter site starts to bleed, raise your arm and put firm pressure on the site. If the bleeding does not stop, get help right away. This is a medical emergency. °· If you went home on the same day as your procedure, a responsible adult should be with you for the first 24 hours after you arrive home. °· Keep all follow-up visits as told by your health care provider. This is important. °Contact a health care provider if: °· You have a fever. °· You have redness, swelling, or yellow drainage around your insertion site. °Get help right away if: °· You have unusual pain at the radial site. °· The catheter insertion area swells very fast. °· The insertion area is bleeding, and the bleeding does not stop when you hold steady pressure on the area. °· Your arm or hand becomes pale, cool, tingly, or numb. °These symptoms may represent a serious problem   that is an emergency. Do not wait to see if the symptoms will go away. Get medical help right away. Call your local emergency services (911 in the [place] Do not drive yourself to the hospital. °Summary °· After the procedure, it is common to have bruising and tenderness at the site. °· Follow instructions from your health care provider about how to take care of your radial site wound. Check the wound every day for signs of infection. °· Do not lift anything that is heavier than 10 lb (4.5 kg), or the limit that you are told, until your health care provider says  that it is safe. °This information is not intended to replace advice given to you by your health care provider. Make sure you discuss any questions you have with your health care provider. °Document Released: 04/09/2010 Document Revised: 04/12/2017 Document Reviewed: 04/12/2017 °Elsevier Interactive Patient Education © 2019 Elsevier Inc. ° °

## 2018-05-11 NOTE — Interval H&P Note (Signed)
History and Physical Interval Note:  05/11/2018 7:39 AM  Dale Sanford  has presented today for surgery, with the diagnosis of decrease ef  The various methods of treatment have been discussed with the patient and family. After consideration of risks, benefits and other options for treatment, the patient has consented to  Procedure(s): RIGHT/LEFT HEART CATH AND CORONARY ANGIOGRAPHY (N/A) as a surgical intervention .  The patient's history has been reviewed, patient examined, no change in status, stable for surgery.  I have reviewed the patient's chart and labs.  Questions were answered to the patient's satisfaction.     Lorine Bears

## 2018-05-14 ENCOUNTER — Encounter (HOSPITAL_COMMUNITY): Payer: Self-pay | Admitting: Cardiovascular Disease

## 2018-05-14 LAB — POCT I-STAT EG7
Acid-base deficit: 3 mmol/L — ABNORMAL HIGH (ref 0.0–2.0)
Bicarbonate: 22.1 mmol/L (ref 20.0–28.0)
Calcium, Ion: 1.16 mmol/L (ref 1.15–1.40)
HCT: 36 % — ABNORMAL LOW (ref 39.0–52.0)
Hemoglobin: 12.2 g/dL — ABNORMAL LOW (ref 13.0–17.0)
O2 Saturation: 65 %
POTASSIUM: 4 mmol/L (ref 3.5–5.1)
SODIUM: 140 mmol/L (ref 135–145)
TCO2: 23 mmol/L (ref 22–32)
pCO2, Ven: 40.5 mmHg — ABNORMAL LOW (ref 44.0–60.0)
pH, Ven: 7.345 (ref 7.250–7.430)
pO2, Ven: 36 mmHg (ref 32.0–45.0)

## 2018-05-15 ENCOUNTER — Telehealth: Payer: Self-pay | Admitting: Cardiology

## 2018-05-15 DIAGNOSIS — R9430 Abnormal result of cardiovascular function study, unspecified: Secondary | ICD-10-CM

## 2018-05-15 DIAGNOSIS — I420 Dilated cardiomyopathy: Secondary | ICD-10-CM

## 2018-05-15 MED ORDER — SACUBITRIL-VALSARTAN 24-26 MG PO TABS: 1.0000 | ORAL_TABLET | Freq: Two times a day (BID) | ORAL | 3 refills | Status: DC

## 2018-05-15 MED ORDER — SPIRONOLACTONE 25 MG PO TABS: 12.5000 mg | ORAL_TABLET | Freq: Every day | ORAL | 3 refills | Status: DC

## 2018-05-15 NOTE — Telephone Encounter (Signed)
Spoke with the patient, he is scheduled on 3/16 with Tereso Newcomer, PA-C for the up titration of Entresto. The patient stopped Losartan and will start taking Entresto after 48 hours off of Losartan. He is coming on 3/5 for his BMET, UPEP and SPEP. He wanted to schedule his PYP scan on the same day as OV on 3/16. Message sent to the schedulers. He is coming in to the office to discuss his visits and FMLA.

## 2018-05-15 NOTE — Telephone Encounter (Signed)
Dale Sanford,  Please change Losartan to Entresto 24/26mg  BID and spironolactone 12.5mg  daily. Repeat BMET in 1 week. Please have him followup with extender in 2 weeks for uptitration of HF meds. Will need 2D echo repeated 2 months after patient is on max guideline directed therapy for HF. Also please get an SPEP and UPEP.    Traci   Please order a PYP scan to rule out amyloid   Traci

## 2018-05-15 NOTE — Telephone Encounter (Signed)
New Message:      Pt wants to know if he can come for his Post Hospital F/U visit this week please? He said he is off from work this week, he was scheduled to be seen next week. I did not know if it was too soon.Marland Kitchen

## 2018-05-16 ENCOUNTER — Telehealth: Payer: Self-pay | Admitting: Cardiology

## 2018-05-16 NOTE — Telephone Encounter (Signed)
Continue on Losartan for now and forward to Margaretmary Dys or Tresa Endo to see about how he can get [employer]

## 2018-05-16 NOTE — Telephone Encounter (Signed)
New Message:    Pt said he can not afford his blood pressure medicine, he did not know the name of it. He said the pharmacist said they would be contacting the office.

## 2018-05-16 NOTE — Addendum Note (Signed)
Addended by: Dustin Flock on: 05/16/2018 11:20 AM   Modules accepted: Orders

## 2018-05-16 NOTE — Telephone Encounter (Signed)
Patient came to the office and I provided him a list of the upcoming appointments and instructions about his changes. He also filed his FMLA paperwork.

## 2018-05-17 MED ORDER — LOSARTAN POTASSIUM 25 MG PO TABS: 25.0000 mg | ORAL_TABLET | Freq: Every day | ORAL | 3 refills | Status: DC

## 2018-05-17 NOTE — Telephone Encounter (Signed)
As long as it is Psychiatric nurse, he should be able to use the [place] copay card to make it much more affordable.

## 2018-05-17 NOTE — Telephone Encounter (Signed)
I do not see patient's medication insurance scanned into Epic. There is a [employer] [place] card but this looks to be medical benefits rather than insurance benefits. If he still has a Comptroller for his drug coverage, he can use a copay card for [employer]. If he has Medicare, we unfortunately do not have any way to make the [facility] cheaper. He may have a deductible at the beginning of the year that is making the copay more expensive. Once it is paid down, Entresto copays for Medicare patients typically run between $35 and $50 per month.

## 2018-05-17 NOTE — Telephone Encounter (Signed)
Spoke with the patient, he expressed understanding about taking Losartan. He was unable to pick up Entresto due to being too expensive.

## 2018-05-17 NOTE — Telephone Encounter (Addendum)
Spoke with the patient, he stated he is on BCBS and he would call his insurance about the prices of [place].

## 2018-05-17 NOTE — Telephone Encounter (Signed)
  Patient states he is returning a call from [place]

## 2018-05-18 ENCOUNTER — Telehealth: Payer: Self-pay

## 2018-05-18 NOTE — Telephone Encounter (Signed)
PA done in Cover My Meds for Entresto 24-26mg  - Approved KEY: [id number]  Approved 05/18/2018 - 05/17/2018

## 2018-05-18 NOTE — Telephone Encounter (Signed)
Spoke with the patient, he is going to pick up the copay cards and let us know if they are able to decrease the price of the medication. He will call on Monday to discuss.

## 2018-05-23 NOTE — Telephone Encounter (Addendum)
Spoke with the patient, he stated that he can afford entresto and is going to stop Losartan today and start entresto on Friday 05/25/18. He is having labs tomorrow at [facility] and is scheduled a 2 week follow up with Robbie Lis PA-C on 06/12/18. The patient had no further questions.

## 2018-05-24 ENCOUNTER — Ambulatory Visit: Payer: BLUE CROSS/BLUE SHIELD | Admitting: Cardiology

## 2018-05-24 DIAGNOSIS — R9430 Abnormal result of cardiovascular function study, unspecified: Secondary | ICD-10-CM | POA: Diagnosis not present

## 2018-05-28 LAB — BASIC METABOLIC PANEL
BUN/Creatinine Ratio: 10 (ref 10–24)
BUN: 11 mg/dL (ref 8–27)
CO2: 20 mmol/L (ref 20–29)
Calcium: 8.5 mg/dL — ABNORMAL LOW (ref 8.6–10.2)
Chloride: 105 mmol/L (ref 96–106)
Creatinine, Ser: 1.12 mg/dL (ref 0.76–1.27)
GFR calc non Af Amer: 67 mL/min/{1.73_m2} (ref >59)
GFR, EST AFRICAN AMERICAN: 77 mL/min/{1.73_m2} (ref >59)
Glucose: 97 mg/dL (ref 65–99)
POTASSIUM: 3.8 mmol/L (ref 3.5–5.2)
Sodium: 141 mmol/L (ref 134–144)

## 2018-05-28 LAB — PROTEIN ELECTROPHORESIS, SERUM
A/G Ratio: 1.7 (ref 0.7–1.7)
Albumin ELP: 3.8 g/dL (ref 2.9–4.4)
Alpha 1: 0.2 g/dL (ref 0.0–0.4)
Alpha 2: 0.6 g/dL (ref 0.4–1.0)
Beta: 0.8 g/dL (ref 0.7–1.3)
Gamma Globulin: 0.7 g/dL (ref 0.4–1.8)
Globulin, Total: 2.3 g/dL (ref 2.2–3.9)
Total Protein: 6.1 g/dL (ref 6.0–8.5)

## 2018-05-28 LAB — PROTEIN ELECTROPHORESIS, URINE REFLEX
Albumin ELP, Urine: 60.5 %
Alpha-1-Globulin, U: 4.1 %
Alpha-2-Globulin, U: 10.7 %
Beta Globulin, U: 13.9 %
Gamma Globulin, U: 10.8 %
Protein, Ur: 30.3 mg/dL

## 2018-05-29 ENCOUNTER — Telehealth (HOSPITAL_COMMUNITY): Payer: Self-pay

## 2018-05-29 NOTE — Telephone Encounter (Signed)
[facility], left  Answering message for the patient for an upcoming appointment on Thursday May 31, 2018. Patient asked to call back with any questions. S.Nakkia Mackiewicz EMTP

## 2018-05-31 ENCOUNTER — Other Ambulatory Visit: Payer: Self-pay

## 2018-05-31 ENCOUNTER — Ambulatory Visit (HOSPITAL_COMMUNITY): Payer: BLUE CROSS/BLUE SHIELD | Attending: Cardiology

## 2018-05-31 DIAGNOSIS — I420 Dilated cardiomyopathy: Secondary | ICD-10-CM

## 2018-05-31 DIAGNOSIS — R9430 Abnormal result of cardiovascular function study, unspecified: Secondary | ICD-10-CM | POA: Diagnosis not present

## 2018-05-31 DIAGNOSIS — I5023 Acute on chronic systolic (congestive) heart failure: Secondary | ICD-10-CM | POA: Diagnosis not present

## 2018-05-31 MED ORDER — TECHNETIUM TC 99M PYROPHOSPHATE
20.9000 | Freq: Once | INTRAVENOUS | Status: AC
  Administered 2018-05-31: 20.9 via INTRAVENOUS

## 2018-06-01 ENCOUNTER — Telehealth: Payer: Self-pay | Admitting: Cardiology

## 2018-06-01 NOTE — Telephone Encounter (Signed)
Spoke with the patient, advised Dr. Mayford Knife still needs to review his results. Sending message to Dr. Mayford Knife to get a return to work note.

## 2018-06-01 NOTE — Telephone Encounter (Signed)
New  Message   Patient is calling to obtain the results of his myocardial perfusion. He also states that he is to obtain a note so that he can return to work. Please call to discuss.

## 2018-06-03 NOTE — Telephone Encounter (Signed)
PYP scan for amyloid was equivocal - please order cardiac MRI with gad to rule out amyloid

## 2018-06-04 ENCOUNTER — Ambulatory Visit: Payer: BLUE CROSS/BLUE SHIELD | Admitting: Physician Assistant

## 2018-06-04 ENCOUNTER — Telehealth: Payer: Self-pay | Admitting: Cardiology

## 2018-06-04 DIAGNOSIS — I420 Dilated cardiomyopathy: Secondary | ICD-10-CM

## 2018-06-04 DIAGNOSIS — I5023 Acute on chronic systolic (congestive) heart failure: Secondary | ICD-10-CM

## 2018-06-04 DIAGNOSIS — I77810 Thoracic aortic ectasia: Secondary | ICD-10-CM

## 2018-06-04 DIAGNOSIS — I359 Nonrheumatic aortic valve disorder, unspecified: Secondary | ICD-10-CM

## 2018-06-04 NOTE — Telephone Encounter (Signed)
Patient called he states he needs a letter faxed stating he has been released to work, if it's full duty or with restrictions. It can be faxed at [phone], to HR, without that they can't let him come back to work.

## 2018-06-04 NOTE — Telephone Encounter (Signed)
Spoke with the pt and he is asking to for a letter to return to work.. he is there today but I advised him that Dr. Mayford Knife wants him to have a Cardiac MRI which he agreed to..  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  Next OV 06/12/18

## 2018-06-05 NOTE — Telephone Encounter (Signed)
PYP scan for amyloid was equivocal - please order cardiac MRI with gad to rule out amyloid 

## 2018-06-06 NOTE — Telephone Encounter (Signed)
Follow Up:    Please call, says he needs to talk to you about his letter for work. He wants to come up here to talk to somebody about his letter.. I told pt you would call and discuss this over the phone.

## 2018-06-06 NOTE — Telephone Encounter (Signed)
Spoke with the patient, he will come by tomorrow to pick up his work note. He had no further questions.

## 2018-06-06 NOTE — Telephone Encounter (Signed)
Due to advanced age, comorbidities including worsening LV function and CHF I have recommended that the patient be out of work for now until seen back by the extender in our office due to increased risk with coronavirus if he becomes infected. Also he needs a repeat echo in 3 months for LV dysfunction

## 2018-06-06 NOTE — Telephone Encounter (Signed)
The patient's MRI was ordered, can he return to work? He needs a letter.

## 2018-06-07 ENCOUNTER — Telehealth: Payer: Self-pay | Admitting: *Deleted

## 2018-06-07 NOTE — Telephone Encounter (Signed)
I discussed appointment with patient. Patient states he has had some shortness of breath, about the same, wonders if it's from new medication. Patient wants to keep appointment to discuss condition, further testing. Pt is aware of pre appointment screening on first floor, he was screened today when he came to pick up paperwork at our office.      Primary Cardiologist: Armanda Magic, MD   Pt contacted. Pt will f/u with [facility] provider as scheduled.  Pt. advised that we are restricting visitors at this time and request that only patients present for check-in prior to their appointment.  All other visitors should remain in their car.  If necessary, only one visitor may come with the patient, into the building. For everyone's safety, all patients and visitor entering our practice area should expect to be screened again prior to entering our waiting area.  Katina Dung, RN  06/07/2018 12:42 PM     COVID-19 Pre-Screening Questions:  . Do you currently have a fever? no . Have you recently travelled on a cruise, internationally, or to [place], [place], [place], [place], [place], or [place], [place] [employer]) ? no . Have you been in contact with someone that is currently pending confirmation of Covid19 testing or has been confirmed to have the Covid19 virus? no  . Are you currently experiencing fatigue or cough? no     I reviewed all of the above with patient.

## 2018-06-07 NOTE — Telephone Encounter (Signed)
LMTCB to discuss rescheduling appointment with B Sharol Harness 06/12/18.

## 2018-06-12 ENCOUNTER — Other Ambulatory Visit: Payer: Self-pay

## 2018-06-12 ENCOUNTER — Ambulatory Visit: Payer: BLUE CROSS/BLUE SHIELD | Admitting: Cardiology

## 2018-06-12 ENCOUNTER — Encounter: Payer: Self-pay | Admitting: Cardiology

## 2018-06-12 VITALS — BP 138/58 | HR 73 | Ht 61.0 in | Wt 133.1 lb

## 2018-06-12 DIAGNOSIS — Z79899 Other long term (current) drug therapy: Secondary | ICD-10-CM

## 2018-06-12 DIAGNOSIS — I5022 Chronic systolic (congestive) heart failure: Secondary | ICD-10-CM

## 2018-06-12 DIAGNOSIS — I428 Other cardiomyopathies: Secondary | ICD-10-CM

## 2018-06-12 MED ORDER — SACUBITRIL-VALSARTAN 49-51 MG PO TABS: 1.0000 | ORAL_TABLET | Freq: Two times a day (BID) | ORAL | 3 refills | Status: DC

## 2018-06-12 NOTE — Patient Instructions (Signed)
Medication Instructions:  INCREASE ENTRESTO TO 49-51 mg TWICE PER DAY If you need a refill on your cardiac medications before your next appointment, please call your pharmacy.   Lab work:PLEASE SCHEDULE TO RETURN IN 1 WEEK BMET If you have labs (blood work) drawn today and your tests are completely normal, you will receive your results only by: Marland Kitchen MyChart Message (if you have MyChart) OR . A paper copy in the mail If you have any lab test that is abnormal or we need to change your treatment, we will call you to review the results.  Testing/Procedures: NONE  Follow-Up: 6 WEEKS WITH Dr Mayford Knife or Robbie Lis At [facility], you and your health needs are our priority.  As part of our continuing mission to provide you with exceptional heart care, we have created designated Provider Care Teams.  These Care Teams include your primary Cardiologist (physician) and Advanced Practice Providers (APPs -  Physician Assistants and Nurse Practitioners) who all work together to provide you with the care you need, when you need it. .   Any Other Special Instructions Will Be Listed Below (If Applicable). WEIGHT:  Weigh daily, if you gain more than 3 lbs in 1 DAY or 5 lbs in 1 WEEK, CALL OUR OFFICE  DASH Eating Plan DASH stands for "Dietary Approaches to Stop Hypertension." The DASH eating plan is a healthy eating plan that has been shown to reduce high blood pressure (hypertension). It may also reduce your risk for type 2 diabetes, heart disease, and stroke. The DASH eating plan may also help with weight loss. What are tips for following this plan?  General guidelines  Avoid eating more than 2,300 mg (milligrams) of salt (sodium) a day. If you have hypertension, you may need to reduce your sodium intake to 1,500 mg a day.  Limit alcohol intake to no more than 1 drink a day for nonpregnant women and 2 drinks a day for men. One drink equals 12 oz of beer, 5 oz of wine, or 1 oz of hard liquor.   Work with your health care provider to maintain a healthy body weight or to lose weight. Ask what an ideal weight is for you.  Get at least 30 minutes of exercise that causes your heart to beat faster (aerobic exercise) most days of the week. Activities may include walking, swimming, or biking.  Work with your health care provider or diet and nutrition specialist (dietitian) to adjust your eating plan to your individual calorie needs. Reading food labels   Check food labels for the amount of sodium per serving. Choose foods with less than 5 percent of the Daily Value of sodium. Generally, foods with less than 300 mg of sodium per serving fit into this eating plan.  To find whole grains, look for the word "whole" as the first word in the ingredient list. Shopping  Buy products labeled as "low-sodium" or "no salt added."  Buy fresh foods. Avoid canned foods and premade or frozen meals. Cooking  Avoid adding salt when cooking. Use salt-free seasonings or herbs instead of table salt or sea salt. Check with your health care provider or pharmacist before using salt substitutes.  Do not fry foods. Cook foods using healthy methods such as baking, boiling, grilling, and broiling instead.  Cook with heart-healthy oils, such as olive, canola, soybean, or sunflower oil. Meal planning  Eat a balanced diet that includes: ? 5 or more servings of fruits and vegetables each day. At each meal, try  to fill half of your plate with fruits and vegetables. ? Up to 6-8 servings of whole grains each day. ? Less than 6 oz of lean meat, poultry, or fish each day. A 3-oz serving of meat is about the same size as a deck of cards. One egg equals 1 oz. ? 2 servings of low-fat dairy each day. ? A serving of nuts, seeds, or beans 5 times each week. ? Heart-healthy fats. Healthy fats called Omega-3 fatty acids are found in foods such as flaxseeds and coldwater fish, like sardines, salmon, and mackerel.  Limit how  much you eat of the following: ? Canned or prepackaged foods. ? Food that is high in trans fat, such as fried foods. ? Food that is high in saturated fat, such as fatty meat. ? Sweets, desserts, sugary drinks, and other foods with added sugar. ? Full-fat dairy products.  Do not salt foods before eating.  Try to eat at least 2 vegetarian meals each week.  Eat more home-cooked food and less restaurant, buffet, and fast food.  When eating at a restaurant, ask that your food be prepared with less salt or no salt, if possible. What foods are recommended? The items listed may not be a complete list. Talk with your dietitian about what dietary choices are best for you. Grains Whole-grain or whole-wheat bread. Whole-grain or whole-wheat pasta. Brown rice. Orpah Cobb. Bulgur. Whole-grain and low-sodium cereals. Pita bread. Low-fat, low-sodium crackers. Whole-wheat flour tortillas. Vegetables Fresh or frozen vegetables (raw, steamed, roasted, or grilled). Low-sodium or reduced-sodium tomato and vegetable juice. Low-sodium or reduced-sodium tomato sauce and tomato paste. Low-sodium or reduced-sodium canned vegetables. Fruits All fresh, dried, or frozen fruit. Canned fruit in natural juice (without added sugar). Meat and other protein foods Skinless chicken or [place]. Ground chicken or [place]. Pork with fat trimmed off. Fish and seafood. Egg whites. Dried beans, peas, or lentils. Unsalted nuts, nut butters, and seeds. Unsalted canned beans. Lean cuts of beef with fat trimmed off. Low-sodium, lean deli meat. Dairy Low-fat (1%) or fat-free (skim) milk. Fat-free, low-fat, or reduced-fat cheeses. Nonfat, low-sodium ricotta or cottage cheese. Low-fat or nonfat yogurt. Low-fat, low-sodium cheese. Fats and oils Soft margarine without trans fats. Vegetable oil. Low-fat, reduced-fat, or light mayonnaise and salad dressings (reduced-sodium). Canola, safflower, olive, soybean, and sunflower oils. Avocado.  Seasoning and other foods Herbs. Spices. Seasoning mixes without salt. Unsalted popcorn and pretzels. Fat-free sweets. What foods are not recommended? The items listed may not be a complete list. Talk with your dietitian about what dietary choices are best for you. Grains Baked goods made with fat, such as croissants, muffins, or some breads. Dry pasta or rice meal packs. Vegetables Creamed or fried vegetables. Vegetables in a cheese sauce. Regular canned vegetables (not low-sodium or reduced-sodium). Regular canned tomato sauce and paste (not low-sodium or reduced-sodium). Regular tomato and vegetable juice (not low-sodium or reduced-sodium). Rosita Fire. Olives. Fruits Canned fruit in a light or heavy syrup. Fried fruit. Fruit in cream or butter sauce. Meat and other protein foods Fatty cuts of meat. Ribs. Fried meat. Tomasa Blase. Sausage. Bologna and other processed lunch meats. Salami. Fatback. Hotdogs. Bratwurst. Salted nuts and seeds. Canned beans with added salt. Canned or smoked fish. Whole eggs or egg yolks. Chicken or [place] with skin. Dairy Whole or 2% milk, cream, and half-and-half. Whole or full-fat cream cheese. Whole-fat or sweetened yogurt. Full-fat cheese. Nondairy creamers. Whipped toppings. Processed cheese and cheese spreads. Fats and oils Butter. Stick margarine. Lard. Shortening. Ghee. Tomasa Blase  fat. Tropical oils, such as coconut, palm kernel, or palm oil. Seasoning and other foods Salted popcorn and pretzels. Onion salt, garlic salt, seasoned salt, table salt, and sea salt. Worcestershire sauce. Tartar sauce. Barbecue sauce. Teriyaki sauce. Soy sauce, including reduced-sodium. Steak sauce. Canned and packaged gravies. Fish sauce. Oyster sauce. Cocktail sauce. Horseradish that you find on the shelf. Ketchup. Mustard. Meat flavorings and tenderizers. Bouillon cubes. Hot sauce and Tabasco sauce. Premade or packaged marinades. Premade or packaged taco seasonings. Relishes. Regular salad  dressings. Where to find more information:  National Heart, Lung, and Blood Institute: PopSteam.is  American Heart Association: www.heart.org Summary  The DASH eating plan is a healthy eating plan that has been shown to reduce high blood pressure (hypertension). It may also reduce your risk for type 2 diabetes, heart disease, and stroke.  With the DASH eating plan, you should limit salt (sodium) intake to 2,300 mg a day. If you have hypertension, you may need to reduce your sodium intake to 1,500 mg a day.  When on the DASH eating plan, aim to eat more fresh fruits and vegetables, whole grains, lean proteins, low-fat dairy, and heart-healthy fats.  Work with your health care provider or diet and nutrition specialist (dietitian) to adjust your eating plan to your individual calorie needs. This information is not intended to replace advice given to you by your health care provider. Make sure you discuss any questions you have with your health care provider. Document Released: 02/24/2011 Document Revised: 02/29/2016 Document Reviewed: 02/29/2016 Elsevier Interactive Patient Education  2019 [employer].

## 2018-06-12 NOTE — Progress Notes (Signed)
06/12/2018 Dale Sanford   1950-08-12  [id number]  Primary Physician Rankins, Fanny Dance, MD Primary Cardiologist: Armanda Magic, MD  Electrophysiologist: None   Reason for Visit/CC: f/u for Chronic Systolic CHF, NICM  HPI:  Dale Sanford is a 68 y.o. male who is being seen today for the evaluation of chronic systolic CHF/ NICM. He is followed by Dr. Mayford Knife.   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 He had a stable aortic valve bioprosthesis at that time and aortic root was mildly dilated at 39 mm. Nuclear stress test for preoperative cardiac clearance showed an EF of 44% with no ischemia.  He was seen in the office by Dr. Mayford Knife on 09/13/2017 at which time he was asymptomatic.  He was ordered to get an echocardiogram to follow-up on his aortic valve replacement in 6 months time.   The patient had echocardiogram on 05/01/2018 and LVEF decreased from 40-45% to 20-25% with moderate MR and moderate aortic regurgitation.  Dr. Mayford Knife ordered pt to stop Losartan and start Entresto and spironolactone and recommended R/LHC. This was done by Dr. Kirke Corin on 05/11/18. Cath showed minimal luminal irregularities with no evidence of obstructive coronary artery disease. There was moderately to severely reduced LV systolic function with an EF of 30 to 35% with global hypokinesis.  Right heart catheterization showed mildly elevated filling pressures, mild pulmonary hypertension and mildly reduced cardiac output.  RA pressure: 7 mmHg, RV pressure 39 over 3 mmHg, pulmonary capillary wedge pressure: 15 mmHg, PA pressure: 39/11 with a mean of 24 mmHg.  Cardiac output was 3.45 with an index of 2.16. He was also ordered to get nuclear  imaging to assess for TTR amyloid but study on 05/31/18 was equivocal. Dr. Mayford Knife has recommended a cardiac MRI, but study not yet completed.   He is back today for f/u. He reports that he has done decently well. He notes some exertional fatigue and occasional mild exertional dyspnea but no resting dyspnea. He is NYHA Functional class II-III. He has not been checking his weight daily at home but office weights have been stable. He appears grossly euvoelmic on exam today. No peripheral edema. Lungs are CTAB. No JVD. He sleeps with 1 pillow and denies orthopnea and PND. He reports full medication compliance. No intolerances. His radial cath site is stable. No complications. He has had f/u labs since his cardiac cath, done on 05/24/18. BMP showed normal SCr and K. BP today is 138/58, after taking AM dose of Entresto, metoprolol and spironolactone.     Cardiac Studies  Procedures   2D Echo 05/01/18 IMPRESSIONS    1. The left ventricle has severely reduced systolic function of 20-25%. The cavity size was mildly increased. There is mild concentric left ventricular hypertrophy. Left ventricular diastolic Doppler parameters are consistent with pseudonormal Elevated  left ventricular end-diastolic pressure.  2. The right ventricle has normal systolic function. The cavity was normal. There is no increase in right ventricular wall thickness. Right ventricular systolic pressure is mildly elevated with an estimated pressure of 36 mmHg.  3. Left atrial size was moderately dilated.  4. The mitral valve is degenerative. There is mild thickening. Mitral valve regurgitation is moderate by color flow Doppler.  5. The tricuspid valve  is normal in structure.  6. S/p Ross procedure with ascending aortic replacement with Hemashield.  7. Aortic valve regurgitation is moderate by color flow Doppler.  8. The pulmonic valve was normal in structure.  9. There is mild dilatation of the aortic root. 10. Right atrial size was  mildly dilated.   RIGHT/LEFT HEART CATH AND CORONARY ANGIOGRAPHY 05/11/18  Conclusion     There is moderate to severe left ventricular systolic dysfunction.   1.  Minimal luminal irregularities with no evidence of obstructive coronary artery disease. 2.  Moderately to severely reduced LV systolic function with an EF of 30 to 35% with global hypokinesis. 3.  Right heart catheterization showed mildly elevated filling pressures, mild pulmonary hypertension and mildly reduced cardiac output.  RA pressure: 7 mmHg, RV pressure 39 over 3 mmHg, pulmonary capillary wedge pressure: 15 mmHg, PA pressure: 39/11 with a mean of 24 mmHg.  Cardiac output was 3.45 with an index of 2.16.  Recommendations: The patient has nonischemic cardiomyopathy for which I recommend medical therapy.  Consider switching losartan to Entresto and adding spironolactone.    Myocardial Amyloid Imaging Planar & Spec 05/31/18 Study Highlights    Equivocal study  Grade 2 uptake, with H/CL ratio between 1-1.5     Current Meds  Medication Sig  . aspirin EC 81 MG tablet Take 1 tablet (81 mg total) by mouth daily.  . metoprolol (TOPROL XL) 200 MG 24 hr tablet Take 1 tablet (200 mg total) by mouth daily.  . sacubitril-valsartan (ENTRESTO) 24-26 MG Take 1 tablet by mouth 2 (two) times daily. Start taking the medication on Friday 05/18/18.  Marland Kitchen spironolactone (ALDACTONE) 25 MG tablet Take 0.5 tablets (12.5 mg total) by mouth daily.   Allergies  Allergen Reactions  . Nyquil Multi-Symptom [Pseudoeph-Doxylamine-Dm-Apap] Itching and Rash   Past Medical History:  Diagnosis Date  . 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 DCM (dilated cardiomyopathy) (HCC) 09/13/2017   EF 40-45% by echo 2017 with nuclear stress test showing no ischemia  . Dilated aortic root (HCC)  09/13/2017   10mm by echo 2017  . GERD (gastroesophageal reflux disease)   . Hypertension   . Poor dental hygiene    Family History  Problem Relation Age of Onset  . CAD Father        "hardening of the arteries"  . Cirrhosis Brother   . Cancer Brother    Past Surgical History:  Procedure Laterality Date  . DESCENDING AORTIC resesection, replacement  12/1999   Hemashield Graft  . 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 RIGHT/LEFT HEART CATH AND CORONARY ANGIOGRAPHY N/A 05/11/2018   Procedure: RIGHT/LEFT HEART CATH AND CORONARY ANGIOGRAPHY;  Surgeon: Iran Ouch, MD;  Location: [facility] IN[facility]ASIVE [facility];  Service: Cardiovascular;  Laterality: N/A;  . TOTAL HIP ARTHROPLASTY Right 02/18/2016   Procedure: RIGHT TOTAL HIP ARTHROPLASTY ANTERIOR APPROACH;  Surgeon: Samson Frederic, MD;  Location: [facility] OR[facility];  Service: Orthopedics;  Laterality: Right;   Social History   Socioeconomic History  . Marital status: Married    Spouse name: Not on file  . Number of children: Not on file  . Years of education: Not on file  . Highest education level: Not on file  Occupational History  . Not on file  Social Needs  . Physicist, medical  strain: Not on file  . Food insecurity:    Worry: Not on file    Inability: Not on file  . Transportation needs:    Medical: Not on file    Non-medical: Not on file  Tobacco Use  . Smoking status: Never Smoker  . Smokeless tobacco: Never Used  Substance and Sexual Activity  . Alcohol use: No    Comment: quit in 2008  . Drug use: No  . Sexual activity: Not on file  Lifestyle  . Physical activity:    Days per week: Not on file    Minutes per session: Not on file  . Stress: Not on file  Relationships  . Social connections:    Talks on phone: Not on file    Gets together: Not on file    Attends religious service: Not on file    Active member of club or organization: Not on file    Attends meetings of clubs or  organizations: Not on file    Relationship status: Not on file  . Intimate partner violence:    Fear of current or ex partner: Not on file    Emotionally abused: Not on file    Physically abused: Not on file    Forced sexual activity: Not on file  Other Topics Concern  . Not on file  Social History Narrative  . Not on file     Lipid Panel  No results found for: CHOL, TRIG, HDL, CHOLHDL, VLDL, LDLCALC, LDLDIRECT  Review of Systems: General: negative for chills, fever, night sweats or weight changes.  Cardiovascular: negative for chest pain, dyspnea on exertion, edema, orthopnea, palpitations, paroxysmal nocturnal dyspnea or shortness of breath Dermatological: negative for rash Respiratory: negative for cough or wheezing Urologic: negative for hematuria Abdominal: negative for nausea, vomiting, diarrhea, bright red blood per rectum, melena, or hematemesis Neurologic: negative for visual changes, syncope, or dizziness All other systems reviewed and are otherwise negative except as noted above.   Physical Exam:  Blood pressure (!) 138/58, pulse 73, height 5\' 1"  (1.549 m), weight 133 lb 1.9 oz (60.4 kg), SpO2 99 %.  General appearance: alert, cooperative and no distress Neck: no adenopathy, no carotid bruit and no JVD Lungs: clear to auscultation bilaterally Heart: regular rate and rhythm, S1, S2 normal, no murmur, click, rub or gallop Extremities: extremities normal, atraumatic, no cyanosis or edema Pulses: 2+ and symmetric Skin: Skin color, texture, turgor normal. No rashes or lesions Neurologic: Grossly normal  EKG not performed -- personally reviewed   ASSESSMENT AND PLAN:   1. NICM: EF 20-25% by recent echo and 30-35% by recent cardiac cath. LHC showed minimal luminal irregularities with no evidence of obstructive coronary artery disease. He was also ordered to get nuclear imaging to assess for TTR amyloid but study on 05/31/18 was equivocal. Dr. Mayford Knife has recommended a  cardiac MRI, but study not yet completed. Continue medical management w/ Entresto, spironolactone and metoprolol succinate.   2. Chronic Systolic CHF, NYHA Functional Class II-III: He notes some exertional fatigue (may be 2/2  blocker) and occasional mild exertional dyspnea but no resting dyspnea. He is NYHA Functional class II-III. He has not been checking his weight daily at home but office weights have been stable. He appears grossly euvoelmic on exam today. No peripheral edema. Lungs are CTAB. No JVD. He sleeps with 1 pillow and denies orthopnea and PND. BP is 138/58 and post cath labs showed stable renal function and K on current dose of Entresto  and Spironolactone. We will further increase Entresto to to 49/51. Continue spironolactone and metoprolol at current dose. No indication for additional diuretics at this time. We discussed recommendations for the management of chronic heart failure to control volume/symptoms and reduce risk of acute exacerbation that may necessitate hospitalization.  These measures include continuation of current diuretics with close outpatient monitoring of volume status through daily weights.  Patient advised to check weight daily and to call our office if greater than 3 pound weight gain in 24 hours or greater than 5 pound weight gain in the course of 1 week.  Patient also strongly encouraged to adhere to a low salt diet, reducing intake to less than 2 g daily.  3. 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 SBE prophylaxis.   4. Other Preventative: given COVID-19, age and underlying medical conditions, pt was advised to refrain from going back to work at this time and to reduce public exposure as much as possible.    Follow-Up in 4-6 weeks with Dr. Mayford Knife or myself.   Dale Sanford Delmer Islam, MHS [facility]  06/12/2018 8:06 AM

## 2018-06-19 ENCOUNTER — Other Ambulatory Visit: Payer: Self-pay

## 2018-06-19 ENCOUNTER — Other Ambulatory Visit: Payer: BLUE CROSS/BLUE SHIELD

## 2018-06-19 DIAGNOSIS — I5022 Chronic systolic (congestive) heart failure: Secondary | ICD-10-CM | POA: Diagnosis not present

## 2018-06-19 DIAGNOSIS — Z79899 Other long term (current) drug therapy: Secondary | ICD-10-CM

## 2018-06-19 LAB — BASIC METABOLIC PANEL
BUN/Creatinine Ratio: 24 (ref 10–24)
BUN: 25 mg/dL (ref 8–27)
CO2: 21 mmol/L (ref 20–29)
Calcium: 8.9 mg/dL (ref 8.6–10.2)
Chloride: 102 mmol/L (ref 96–106)
Creatinine, Ser: 1.05 mg/dL (ref 0.76–1.27)
GFR calc non Af Amer: 72 mL/min/{1.73_m2} (ref >59)
GFR, EST AFRICAN AMERICAN: 83 mL/min/{1.73_m2} (ref >59)
Glucose: 109 mg/dL — ABNORMAL HIGH (ref 65–99)
Potassium: 4.4 mmol/L (ref 3.5–5.2)
Sodium: 141 mmol/L (ref 134–144)

## 2018-06-20 ENCOUNTER — Telehealth: Payer: Self-pay

## 2018-06-20 NOTE — Telephone Encounter (Signed)
-----   Message from Allayne Butcher, [place] sent at 06/19/2018  8:11 PM EDT ----- Labs are stable after increasing dose of Entresto. Renal function and K within normal limits. Continue current dose. No changes.

## 2018-06-20 NOTE — Telephone Encounter (Signed)
Notes recorded by Sigurd Sos, RN on 06/20/2018 at 11:00 AM EDT The patient has been notified of the result and verbalized understanding. All questions (if any) were answered. Sigurd Sos, RN 06/20/2018 11:00 AM

## 2018-07-23 ENCOUNTER — Other Ambulatory Visit: Payer: Self-pay

## 2018-07-23 ENCOUNTER — Encounter: Payer: Self-pay | Admitting: Cardiology

## 2018-07-23 ENCOUNTER — Telehealth (INDEPENDENT_AMBULATORY_CARE_PROVIDER_SITE_OTHER): Payer: BLUE CROSS/BLUE SHIELD | Admitting: Cardiology

## 2018-07-23 VITALS — Ht 61.0 in | Wt 138.0 lb

## 2018-07-23 DIAGNOSIS — I5022 Chronic systolic (congestive) heart failure: Secondary | ICD-10-CM | POA: Diagnosis not present

## 2018-07-23 NOTE — Patient Instructions (Signed)
Medication Instructions:  none If you need a refill on your cardiac medications before your next appointment, please call your pharmacy.   Lab work: none If you have labs (blood work) drawn today and your tests are completely normal, you will receive your results only by: Marland Kitchen. MyChart Message (if you have MyChart) OR . A paper copy in the mail If you have any lab test that is abnormal or we need to change your treatment, we will call you to review the results.  Testing/Procedures: none  Follow-Up:  3 MONTHS With Dr Mayford [facility]urner At [facility], you and your health needs are our priority.  As part of our continuing mission to provide you with exceptional heart care, we have created designated Provider Care Teams.  These Care Teams include your primary Cardiologist (physician) and Advanced Practice Providers (APPs -  Physician Assistants and Nurse Practitioners) who all work together to provide you with the care you need, when you need it.  Any Other Special Instructions Will Be Listed Below (If Applicable).  Weigh Daily:  If you gain more than 3 lbs in 1 day or 5 lbs in 1 week, call office  DASH Eating Plan DASH stands for "Dietary Approaches to Stop Hypertension." The DASH eating plan is a healthy eating plan that has been shown to reduce high blood pressure (hypertension). It may also reduce your risk for type 2 diabetes, heart disease, and stroke. The DASH eating plan may also help with weight loss. What are tips for following this plan?  General guidelines  Avoid eating more than 2,300 mg (milligrams) of salt (sodium) a day. If you have hypertension, you may need to reduce your sodium intake to 1,500 mg a day.  Limit alcohol intake to no more than 1 drink a day for nonpregnant women and 2 drinks a day for men. One drink equals 12 oz of beer, 5 oz of wine, or 1 oz of hard liquor.  Work with your health care provider to maintain a healthy body weight or to lose weight. Ask what an ideal  weight is for you.  Get at least 30 minutes of exercise that causes your heart to beat faster (aerobic exercise) most days of the week. Activities may include walking, swimming, or biking.  Work with your health care provider or diet and nutrition specialist (dietitian) to adjust your eating plan to your individual calorie needs. Reading food labels   Check food labels for the amount of sodium per serving. Choose foods with less than 5 percent of the Daily Value of sodium. Generally, foods with less than 300 mg of sodium per serving fit into this eating plan.  To find whole grains, look for the word "whole" as the first word in the ingredient list. Shopping  Buy products labeled as "low-sodium" or "no salt added."  Buy fresh foods. Avoid canned foods and premade or frozen meals. Cooking  Avoid adding salt when cooking. Use salt-free seasonings or herbs instead of table salt or sea salt. Check with your health care provider or pharmacist before using salt substitutes.  Do not fry foods. Cook foods using healthy methods such as baking, boiling, grilling, and broiling instead.  Cook with heart-healthy oils, such as olive, canola, soybean, or sunflower oil. Meal planning  Eat a balanced diet that includes: ? 5 or more servings of fruits and vegetables each day. At each meal, try to fill half of your plate with fruits and vegetables. ? Up to 6-8 servings of whole grains  each day. ? Less than 6 oz of lean meat, poultry, or fish each day. A 3-oz serving of meat is about the same size as a deck of cards. One egg equals 1 oz. ? 2 servings of low-fat dairy each day. ? A serving of nuts, seeds, or beans 5 times each week. ? Heart-healthy fats. Healthy fats called Omega-3 fatty acids are found in foods such as flaxseeds and coldwater fish, like sardines, salmon, and mackerel.  Limit how much you eat of the following: ? Canned or prepackaged foods. ? Food that is high in trans fat, such as  fried foods. ? Food that is high in saturated fat, such as fatty meat. ? Sweets, desserts, sugary drinks, and other foods with added sugar. ? Full-fat dairy products.  Do not salt foods before eating.  Try to eat at least 2 vegetarian meals each week.  Eat more home-cooked food and less restaurant, buffet, and fast food.  When eating at a restaurant, ask that your food be prepared with less salt or no salt, if possible. What foods are recommended? The items listed may not be a complete list. Talk with your dietitian about what dietary choices are best for you. Grains Whole-grain or whole-wheat bread. Whole-grain or whole-wheat pasta. Brown rice. Orpah Cobb. Bulgur. Whole-grain and low-sodium cereals. Pita bread. Low-fat, low-sodium crackers. Whole-wheat flour tortillas. Vegetables Fresh or frozen vegetables (raw, steamed, roasted, or grilled). Low-sodium or reduced-sodium tomato and vegetable juice. Low-sodium or reduced-sodium tomato sauce and tomato paste. Low-sodium or reduced-sodium canned vegetables. Fruits All fresh, dried, or frozen fruit. Canned fruit in natural juice (without added sugar). Meat and other protein foods Skinless chicken or [place]. Ground chicken or [place]. Pork with fat trimmed off. Fish and seafood. Egg whites. Dried beans, peas, or lentils. Unsalted nuts, nut butters, and seeds. Unsalted canned beans. Lean cuts of beef with fat trimmed off. Low-sodium, lean deli meat. Dairy Low-fat (1%) or fat-free (skim) milk. Fat-free, low-fat, or reduced-fat cheeses. Nonfat, low-sodium ricotta or cottage cheese. Low-fat or nonfat yogurt. Low-fat, low-sodium cheese. Fats and oils Soft margarine without trans fats. Vegetable oil. Low-fat, reduced-fat, or light mayonnaise and salad dressings (reduced-sodium). Canola, safflower, olive, soybean, and sunflower oils. Avocado. Seasoning and other foods Herbs. Spices. Seasoning mixes without salt. Unsalted popcorn and pretzels.  Fat-free sweets. What foods are not recommended? The items listed may not be a complete list. Talk with your dietitian about what dietary choices are best for you. Grains Baked goods made with fat, such as croissants, muffins, or some breads. Dry pasta or rice meal packs. Vegetables Creamed or fried vegetables. Vegetables in a cheese sauce. Regular canned vegetables (not low-sodium or reduced-sodium). Regular canned tomato sauce and paste (not low-sodium or reduced-sodium). Regular tomato and vegetable juice (not low-sodium or reduced-sodium). Rosita Fire. Olives. Fruits Canned fruit in a light or heavy syrup. Fried fruit. Fruit in cream or butter sauce. Meat and other protein foods Fatty cuts of meat. Ribs. Fried meat. Tomasa Blase. Sausage. Bologna and other processed lunch meats. Salami. Fatback. Hotdogs. Bratwurst. Salted nuts and seeds. Canned beans with added salt. Canned or smoked fish. Whole eggs or egg yolks. Chicken or [place] with skin. Dairy Whole or 2% milk, cream, and half-and-half. Whole or full-fat cream cheese. Whole-fat or sweetened yogurt. Full-fat cheese. Nondairy creamers. Whipped toppings. Processed cheese and cheese spreads. Fats and oils Butter. Stick margarine. Lard. Shortening. Ghee. Bacon fat. Tropical oils, such as coconut, palm kernel, or palm oil. Seasoning and other foods Salted popcorn and  pretzels. Onion salt, garlic salt, seasoned salt, table salt, and sea salt. Worcestershire sauce. Tartar sauce. Barbecue sauce. Teriyaki sauce. Soy sauce, including reduced-sodium. Steak sauce. Canned and packaged gravies. Fish sauce. Oyster sauce. Cocktail sauce. Horseradish that you find on the shelf. Ketchup. Mustard. Meat flavorings and tenderizers. Bouillon cubes. Hot sauce and Tabasco sauce. Premade or packaged marinades. Premade or packaged taco seasonings. Relishes. Regular salad dressings. Where to find more information:  National Heart, Lung, and Blood Institute: PopSteam.is   American Heart Association: www.heart.org Summary  The DASH eating plan is a healthy eating plan that has been shown to reduce high blood pressure (hypertension). It may also reduce your risk for type 2 diabetes, heart disease, and stroke.  With the DASH eating plan, you should limit salt (sodium) intake to 2,300 mg a day. If you have hypertension, you may need to reduce your sodium intake to 1,500 mg a day.  When on the DASH eating plan, aim to eat more fresh fruits and vegetables, whole grains, lean proteins, low-fat dairy, and heart-healthy fats.  Work with your health care provider or diet and nutrition specialist (dietitian) to adjust your eating plan to your individual calorie needs. This information is not intended to replace advice given to you by your health care provider. Make sure you discuss any questions you have with your health care provider. Document Released: 02/24/2011 Document Revised: 02/29/2016 Document Reviewed: 02/29/2016 Elsevier Interactive Patient Education  2019 [employer].

## 2018-07-23 NOTE — Progress Notes (Signed)
Virtual Visit via Telephone Note   This visit type was conducted due to national recommendations for restrictions regarding the COVID-19 Pandemic (e.g. social distancing) in an effort to limit this patient's exposure and mitigate transmission in our community.  Due to his co-morbid illnesses, this patient is at least at moderate risk for complications without adequate follow up.  This format is felt to be most appropriate for this patient at this time.  The patient did not have access to video technology/had technical difficulties with video requiring transitioning to audio format only (telephone).  All issues noted in this document were discussed and addressed.  No physical exam could be performed with this format.  Please refer to the patient's chart for his  consent to telehealth for [facility].   Date:  07/23/2018   ID:  Dale Sanford, DOB 12/13/1950, MRN [id number]  Patient Location: Home Provider Location: Home  PCP:  Rankins, Fanny DanceVictoria R, MD  Cardiologist:  Armanda Magicraci Turner, MD  Electrophysiologist:  None   Evaluation Performed:  Follow-Up Visit  Chief Complaint:  F/u for Chronic Systolic HF/  Medication Management   History of Present Illness:    Dale Sanford is a 68 y.o. male who is being seen today for the evaluation of chronic systolic CHF/ NICM. He is followed by Dr. Mayford Knifeurner.   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 He had a stable aortic valve bioprosthesis at that time and aortic root was mildly dilated at 39 mm. Nuclear stress test for preoperative cardiac clearance showed an EF of 44% with no ischemia.  He was seen in the office by Dr. Mayford Knifeurner on 09/13/2017 at which time he was  asymptomatic. He was ordered to get an echocardiogram to follow-up on his aortic valve replacement in 6 months time.   The patient had echocardiogram on 05/01/2018 and LVEF decreased from 40-45% to 20-25% with moderate MR and moderate aortic regurgitation.Dr. Mayford Knifeurner ordered pt to stop Losartan and start Entresto and spironolactone and recommended R/LHC. This was done by Dr. Kirke CorinArida on 05/11/18. Cath showed minimal luminal irregularities with no evidence of obstructive coronary artery disease. There was moderately to severely reduced LV systolic function with an EF of 30 to 35% with global hypokinesis. Right heart catheterization showed mildly elevated filling pressures, mild pulmonary hypertension and mildly reduced cardiac output. RA pressure: 7 mmHg, RV pressure 39 over 3 mmHg, pulmonary capillary wedge pressure: 15 mmHg, PA pressure: 39/11 with a mean of 24 mmHg. Cardiac output was 3.45 with an index of 2.16. He was also ordered to get nuclear imaging to assess for TTR amyloid but study on 05/31/18 was equivocal. Dr. Mayford Knifeurner had recommended a cardiac MRI, but study not yet completed.  Also, patient had follow-up labs post cardiac cath which showed normal renal function and electrolytes.  I evaluated him on 06/12/18.  At that time, he noted some exertional fatigue and occasional mild exertional dyspnea but no resting dyspnea. He was felt to be NYHA functional class II-III.  He was euvolemic on exam.  Blood pressure was 138/58.  This was after he had already taken his morning doses of Entresto, metoprolol and spironolactone.  His Entresto was increased to 49/51.  He had follow-up basic metabolic panel a week  later that showed normal renal function and electrolytes.  Today's visit is for follow-up.  He reports that he has done well since his last OV. Breathing stable. No dyspnea. Significant improvement w/ Entresto. Excercise tolerance has improved since starting medication. He was able to mow his lawn the  other day w/o having to stop and rest. No chest pain. No LEE, orthopnea or PND. Only sleeps w/ 1 pillow. Reports full medication compliance. No side effects. No BP cuff at home, thus no reading this morning. Denies dizziness, syncope/ near syncope.   The patient does not have symptoms concerning for COVID-19 infection (fever, chills, cough, or new shortness of breath).  Pt expressed eagerness to return to work.  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  He also helps to take care of his 47 year old mother.  Given the above cardiac issues, male sex and age, he would be at an extremely high risk of severe illness and possibly death if he contracts COVID-19.  Given his risk, I strongly urged that he continue to social distance and I advised that he does not return to work at this time.  He will contact his employer to see if he can extend his leave of absence.   Past Medical History:  Diagnosis Date  . 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 DCM (dilated cardiomyopathy) (HCC) 09/13/2017   EF 40-45% by echo 2017 with nuclear stress test showing no ischemia  . Dilated aortic root (HCC) 09/13/2017   39mm by echo 2017  . GERD (gastroesophageal reflux disease)   . Hypertension   . Poor dental hygiene    Past Surgical History:  Procedure Laterality Date  . DESCENDING AORTIC resesection, replacement  12/1999   Hemashield Graft  . 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 RIGHT/LEFT HEART CATH AND CORONARY ANGIOGRAPHY N/A 05/11/2018   Procedure: RIGHT/LEFT HEART CATH AND CORONARY ANGIOGRAPHY;  Surgeon: Iran Ouch, MD;  Location: [facility];  Service: Cardiovascular;  Laterality: N/A;  . TOTAL HIP ARTHROPLASTY Right 02/18/2016   Procedure: RIGHT TOTAL HIP ARTHROPLASTY ANTERIOR  APPROACH;  Surgeon: Samson Frederic, MD;  Location: [facility] OR[facility];  Service: Orthopedics;  Laterality: Right;     Current Meds  Medication Sig  . aspirin EC 81 MG tablet Take 1 tablet (81 mg total) by mouth daily.  . metoprolol (TOPROL XL) 200 MG 24 hr tablet Take 1 tablet (200 mg total) by mouth daily.  . sacubitril-valsartan (ENTRESTO) 49-51 MG Take 1 tablet by mouth 2 (two) times daily.  Marland Kitchen spironolactone (ALDACTONE) 25 MG tablet Take 0.5 tablets (12.5 mg total) by mouth daily.     Allergies:   Nyquil multi-symptom [pseudoeph-doxylamine-dm-apap]   Social History   Tobacco Use  . Smoking status: Never Smoker  . Smokeless tobacco: Never Used  Substance Use Topics  . Alcohol use: No    Comment: quit in 2008  . Drug use: No     Family Hx: The patient's family history includes CAD in his father; Cancer in his brother; Cirrhosis in his brother.  ROS:   Please see the history of present illness.     All other systems reviewed and are negative.   Prior CV studies:   The following studies were reviewed today:  2D Echo 05/01/18 IMPRESSIONS  1. The left ventricle has severely reduced systolic function of 20-25%. The cavity size was mildly increased. There is mild concentric left ventricular hypertrophy. Left ventricular diastolic Doppler parameters are consistent with pseudonormal Elevated  left ventricular end-diastolic pressure. 2. The right ventricle has normal systolic function. The cavity was normal. There is no increase in right ventricular wall thickness. Right ventricular systolic pressure is mildly elevated with an estimated pressure of 36 mmHg. 3. Left atrial size was moderately dilated. 4. The mitral valve is degenerative. There is mild thickening. Mitral valve regurgitation is moderate by color flow Doppler. 5. The tricuspid valve is normal in structure. 6. S/p Ross procedure with ascending aortic replacement with Hemashield. 7. Aortic valve regurgitation is  moderate by color flow Doppler. 8. The pulmonic valve was normal in structure. 9. There is mild dilatation of the aortic root. 10. Right atrial size was mildly dilated.   RIGHT/LEFT HEART CATH AND CORONARY ANGIOGRAPHY 05/11/18  Conclusion     There is moderate to severe left ventricular systolic dysfunction.  1. Minimal luminal irregularities with no evidence of obstructive coronary artery disease. 2. Moderately to severely reduced LV systolic function with an EF of 30 to 35% with global hypokinesis. 3. Right heart catheterization showed mildly elevated filling pressures, mild pulmonary hypertension and mildly reduced cardiac output. RA pressure: 7 mmHg, RV pressure 39 over 3 mmHg, pulmonary capillary wedge pressure: 15 mmHg, PA pressure: 39/11 with a mean of 24 mmHg. Cardiac output was 3.45 with an index of 2.16.  Recommendations: The patient has nonischemic cardiomyopathy for which I recommend medical therapy. Consider switching losartan to Entresto and adding spironolactone.    Myocardial Amyloid Imaging Planar & Spec 05/31/18 Study Highlights    Equivocal study  Grade 2 uptake, with H/CL ratio between 1-1.5     Labs/Other Tests and Data Reviewed:    EKG:  No ECG reviewed.  Recent Labs: 05/07/2018: Platelets 215 05/11/2018: Hemoglobin 12.2 06/19/2018: BUN 25; Creatinine, Ser 1.05; Potassium 4.4; Sodium 141   Recent Lipid Panel No results found for: CHOL, TRIG, HDL, CHOLHDL, LDLCALC, LDLDIRECT  Wt Readings from Last 3 Encounters:  07/23/18 138 lb (62.6 kg)  06/12/18 133 lb 1.9 oz (60.4 kg)  05/11/18 135 lb (61.2 kg)     Objective:    Vital Signs:  Ht 5\' 1"  (1.549 m)   Wt 138 lb (62.6 kg)   BMI 26.07 kg/m    Exam: Well sounding male, in no acute distress. Speech unlabored. Breathing unlabored.   ASSESSMENT & PLAN:    1. NICM: EF 20-25% by recent echo and 30-35% by recent cardiac cath. LHC showed minimal luminal irregularities with no evidence  of obstructive coronary artery disease. He was also ordered to get nuclear imaging to assess for TTR amyloid but study on 05/31/18 was equivocal. Dr. Mayford Knife has recommended a cardiac MRI, but study not yet completed. Continue medical management w/ Entresto, spironolactone and metoprolol succinate.   2. Chronic Systolic CHF, NYHA Functional Class II: Much improved w/ Entresto. Exercise tolerance improved. Able to mow lawn w/o having to stop and rest. Tolerating HF meds well. Volume stable. No dyspnea, orthopnea, PND or LEE. Continue Entresto, spironolactone and metoprolol at current dose. No indication for additional diuretics at this time. We discussed recommendations for the management of chronic heart failure to control volume/symptoms and reduce risk of acute exacerbation that may necessitate hospitalization.  These measures include continuation of current diuretics with close outpatient monitoring of volume status through daily weights.  Patient  advised to check weight daily and to call our office if greater than 3 pound weight gain in 24 hours or greater than 5 pound weight gain in the course of 1 week.  Patient also strongly encouraged to adhere to a low salt diet, reducing intake to less than 2 g daily.   3. 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 SBE prophylaxis.   COVID-19 Education: The signs and symptoms of COVID-19 were discussed with the patient and how to seek care for testing (follow up with PCP or arrange E-visit).  The importance of social distancing was discussed today. Also, pt expressed eagerness to return to work.  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  He also helps to take care of his 91 year old mother.  Given the above cardiac issues, male sex and age, he would be at an extremely high risk of severe illness  and possibly death if he contracts COVID-19.  Given his risk, I strongly urged that he continue to social distance and I advised that he does not return to work at this time.  He will contact his employer to see if he can extend his leave of absence.  Time:   Today, I have spent 15 minutes with the patient with telehealth technology discussing the above problems.     Medication Adjustments/Labs and Tests Ordered: Current medicines are reviewed at length with the patient today.  Concerns regarding medicines are outlined above.   Tests Ordered: No orders of the defined types were placed in this encounter.   Medication Changes: No orders of the defined types were placed in this encounter.   Disposition:  Follow up in August w/ Dr. Mayford Knife   Signed, Robbie Lis, PA-C  07/23/2018 9:51 AM    [facility]

## 2018-07-24 ENCOUNTER — Encounter: Payer: Self-pay | Admitting: Cardiology

## 2018-07-24 NOTE — Telephone Encounter (Signed)
error 

## 2018-07-27 ENCOUNTER — Telehealth: Payer: Self-pay | Admitting: Cardiology

## 2018-07-27 NOTE — Telephone Encounter (Signed)
Pt is waiting for our office to send a fax for him to return to work.  He had a Telehealth visit with [place] on 05/04, and per the pt he says  he was told not to go back to work. He needs our office to verify this information so he can remain home.

## 2018-07-27 NOTE — Telephone Encounter (Signed)
I spoke to patient and sent out letter to his home.

## 2018-07-31 NOTE — Telephone Encounter (Signed)
Pt called again asking about the status of his paperwork.

## 2018-07-31 NOTE — Telephone Encounter (Addendum)
Patient called stating he has not received the paperwork.  Can the paperwork be faxed to [phone] Attn: Ms. Meda Coffee And fax to [phone], Attn : Rolley Sims  Patient would also like call.

## 2018-08-01 NOTE — Telephone Encounter (Signed)
  Patient is calling again to find out the status of his paperwork

## 2018-08-09 ENCOUNTER — Telehealth: Payer: Self-pay | Admitting: Cardiology

## 2018-08-09 NOTE — Telephone Encounter (Signed)
New Message    Pt is calling and is wondering if he can have a note for him to return to work   Please call

## 2018-08-09 NOTE — Telephone Encounter (Signed)
I spoke to the patient who is requesting a note to return to work.  Brittainy is reluctant to have him return at this time and advises against it, but understands the patient's wishes.  We will forward a letter stating such.

## 2018-08-21 ENCOUNTER — Telehealth: Payer: Self-pay | Admitting: Cardiology

## 2018-08-21 NOTE — Telephone Encounter (Signed)
New Message     Pt is calling and would like an office visit, he said he is ready to go go back to work and needs a note    Please call

## 2018-08-22 NOTE — Telephone Encounter (Signed)
Recommend virtual visit. 

## 2018-08-22 NOTE — Telephone Encounter (Signed)
Spoke with the patient, he accepted to do a virtual visit. Sent a message to scheduling to see if any appointments are available for [employer].

## 2018-08-24 ENCOUNTER — Telehealth: Payer: Self-pay | Admitting: Cardiology

## 2018-08-24 NOTE — Telephone Encounter (Signed)
Spoke with the patient, he accepted having a virtual visit on 09/12/18.

## 2018-08-24 NOTE — Telephone Encounter (Signed)
New Message    Pt is calling and would like to speak with  Romeo Apple     Please call

## 2018-09-11 ENCOUNTER — Telehealth: Payer: Self-pay

## 2018-09-11 DIAGNOSIS — I5042 Chronic combined systolic (congestive) and diastolic (congestive) heart failure: Secondary | ICD-10-CM | POA: Insufficient documentation

## 2018-09-11 NOTE — Telephone Encounter (Signed)
Patient will come to the office for his appointment tomorrow. Patient answers no to all screening questions below. Patient understands that there are no visitors allowed. Patient understands not to arrive more than 15 minutes prior to the scheduled appointment time and that a mask will be required for the visit.       COVID-19 Pre-Screening Questions:  . In the past 7 to 10 days have you had a cough,  shortness of breath, headache, congestion, fever (100 or greater) body aches, chills, sore throat, or sudden loss of taste or sense of smell? NO . Have you been around anyone with known Covid 19?NO . Have you been around anyone who is awaiting Covid 19 test results in the past 7 to 10 days? NO . Have you been around anyone who has been exposed to Covid 19, or has mentioned symptoms of Covid 19 within the past 7 to 10 days? NO

## 2018-09-11 NOTE — Progress Notes (Signed)
Cardiology Office Note    Date:  09/12/2018   ID:  Dale Sanford, DOB [birth year], MRN [id number]  PCP:  Clayborn Heron, MD  Cardiologist: Armanda Magic, MD EPS: None  Chief Complaint  Patient presents with  . Follow-up    History of Present Illness:  Dale Sanford is a 68 y.o. 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  2D echocardiogram 12/01/2015 showed mildly reduced LV systolic function with EF 40 to 45% with diffuse hypokinesis and grade 2 diastolic dysfunction.  He had a stable aortic valve bioprosthesis at that time and aortic root was mildly dilated at 39 mm.  Nuclear stress test for preoperative cardiac clearance showed an EF of 44% with no ischemia.   Echocardiogram on 05/01/2018 and LVEF decreased from 40-45% to 20-25% with moderate MR and moderate aortic regurgitation.  Dr. Mayford Knife ordered pt to stop Losartan and start Entresto and spironolactone and recommended R/LHC. This was done by Dr. Kirke Corin on 05/11/18. Cath showed minimal luminal irregularities with no evidence of obstructive coronary artery disease. There was moderately to severely reduced LV systolic function with an EF of 30 to 35% with global hypokinesis.  Right heart catheterization showed mildly elevated filling pressures, mild pulmonary hypertension and mildly reduced cardiac output.  RA pressure: 7 mmHg, RV pressure 39 over 3 mmHg, pulmonary capillary wedge pressure: 15 mmHg, PA pressure: 39/11 with a mean of 24 mmHg.  Cardiac output was 3.45 with an index of 2.16. He was also ordered to get nuclear imaging to assess for TTR amyloid but study on 05/31/18 was equivocal. Dr. Mayford Knife had recommended a cardiac MRI.  Patient comes in today because he wants to go back to work. He does maintenance working on Scientist, water quality. Says he needs to go back to work to pay bills. Takes care of his 24 yr old  mother. Works with 3 other people but says he will wear a mask and mostly works on his own. Has a sausage and egg biscuit for breakfast. Mowing the yard and gets a little short of breath. No edema.      Past Medical History:  Diagnosis Date  . 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 DCM (dilated cardiomyopathy) (HCC) 09/13/2017   EF 40-45% by echo 2017 with nuclear stress test showing no ischemia  . Dilated aortic root (HCC) 09/13/2017   39mm by echo 2017  . GERD (gastroesophageal reflux disease)   . Hypertension   . Poor dental hygiene     Past Surgical History:  Procedure Laterality Date  . DESCENDING AORTIC resesection, replacement  12/1999   Hemashield Graft  . 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 RIGHT/LEFT HEART CATH AND CORONARY ANGIOGRAPHY N/A 05/11/2018   Procedure: RIGHT/LEFT HEART CATH AND CORONARY ANGIOGRAPHY;  Surgeon: Iran Ouch, MD;  Location: [facility];  Service: Cardiovascular;  Laterality: N/A;  . TOTAL HIP ARTHROPLASTY Right 02/18/2016   Procedure: RIGHT TOTAL HIP ARTHROPLASTY ANTERIOR APPROACH;  Surgeon: Samson Frederic, MD;  Location: [facility];  Service: Orthopedics;  Laterality: Right;    Current Medications: Current Meds  Medication Sig  . aspirin EC 81 MG tablet Take 1 tablet (81  mg total) by mouth daily.  . metoprolol (TOPROL XL) 200 MG 24 hr tablet Take 1 tablet (200 mg total) by mouth daily.  . sacubitril-valsartan (ENTRESTO) 49-51 MG Take 1 tablet by mouth 2 (two) times daily.  Marland Kitchen spironolactone (ALDACTONE) 25 MG tablet Take 0.5 tablets (12.5 mg total) by mouth daily.     Allergies:   Nyquil multi-symptom [pseudoeph-doxylamine-dm-apap]   Social History   Socioeconomic History  . Marital status: Married    Spouse name: Not on file  . Number  of children: Not on file  . Years of education: Not on file  . Highest education level: Not on file  Occupational History  . Not on file  Social Needs  . Financial resource strain: Not on file  . Food insecurity    Worry: Not on file    Inability: Not on file  . Transportation needs    Medical: Not on file    Non-medical: Not on file  Tobacco Use  . Smoking status: Never Smoker  . Smokeless tobacco: Never Used  Substance and Sexual Activity  . Alcohol use: No    Comment: quit in 2008  . Drug use: No  . Sexual activity: Not on file  Lifestyle  . Physical activity    Days per week: Not on file    Minutes per session: Not on file  . Stress: Not on file  Relationships  . Social Musician on phone: Not on file    Gets together: Not on file    Attends religious service: Not on file    Active member of club or organization: Not on file    Attends meetings of clubs or organizations: Not on file    Relationship status: Not on file  Other Topics Concern  . Not on file  Social History Narrative  . Not on file     Family History:  The patient's   family history includes CAD in his father; Cancer in his brother; Cirrhosis in his brother.   ROS:   Please see the history of present illness.    Review of Systems  Cardiovascular: Positive for dyspnea on exertion.   All other systems reviewed and are negative.   PHYSICAL EXAM:   VS:  BP 122/60   Pulse 67   Ht  (1.549 m)   Wt 133 lb 1.9 oz (60.4 kg)   SpO2 99%   BMI 25.15 kg/m   Physical Exam  GEN: Thin, in no acute distress  Neck: slight increase JVD, no carotid bruits, or masses Cardiac:RRR; soft 1/6 diastolic murmur LSB Respiratory:  clear to auscultation bilaterally, normal work of breathing GI: soft, nontender, nondistended, + BS Ext: without cyanosis, clubbing, or edema, Good distal pulses bilaterally Neuro:  Alert and Oriented x 3 Psych: euthymic mood, full affect  Wt Readings from Last 3  Encounters:  09/12/18 133 lb 1.9 oz (60.4 kg)  07/23/18 138 lb (62.6 kg)  06/12/18 133 lb 1.9 oz (60.4 kg)      Studies/Labs Reviewed:   EKG:  EKG is not ordered today.    Recent Labs: 05/07/2018: Platelets 215 05/11/2018: Hemoglobin 12.2 06/19/2018: BUN 25; Creatinine, Ser 1.05; Potassium 4.4; Sodium 141   Lipid Panel No results found for: CHOL, TRIG, HDL, CHOLHDL, VLDL, LDLCALC, LDLDIRECT  Additional studies/ records that were reviewed today include:  Echo 04/2018 IMPRESSIONS      1. The left ventricle has severely reduced systolic function of 20-25%. The cavity size  was mildly increased. There is mild concentric left ventricular hypertrophy. Left ventricular diastolic Doppler parameters are consistent with pseudonormal Elevated  left ventricular end-diastolic pressure.  2. The right ventricle has normal systolic function. The cavity was normal. There is no increase in right ventricular wall thickness. Right ventricular systolic pressure is mildly elevated with an estimated pressure of 36 mmHg.  3. Left atrial size was moderately dilated.  4. The mitral valve is degenerative. There is mild thickening. Mitral valve regurgitation is moderate by color flow Doppler.  5. The tricuspid valve is normal in structure.  6. S/p Ross procedure with ascending aortic replacement with Hemashield.  7. Aortic valve regurgitation is moderate by color flow Doppler.  8. The pulmonic valve was normal in structure.  9. There is mild dilatation of the aortic root. 10. Right atrial size was mildly dilated.   FINDINGS  Left Ventricle: The left ventricle has severely reduced systolic function of 20-25%. The cavity size was mildly increased. There is mild concentric left ventricular hypertrophy. Left ventricular diastolic Doppler parameters are consistent with  pseudonormal (grade II) Elevated left ventricular end-diastolic pressure Right Ventricle: The right ventricle has normal systolic function. The  cavity was normal. There is no increase in right ventricular wall thickness. Right ventricular systolic pressure is mildly elevated with an estimated pressure of 28.0 mmHg. Left Atrium: left atrial size was moderately dilated Right Atrium: right atrial size was mildly dilated Interatrial Septum: No atrial level shunt detected by color flow Doppler. Pericardium: There is no evidence of pericardial effusion. Mitral Valve: The mitral valve is degenerative in appearance. There is mild thickening. Mitral valve regurgitation is moderate by color flow Doppler. Tricuspid Valve: The tricuspid valve is normal in structure. Tricuspid valve regurgitation is mild by color flow Doppler. Aortic Valve: The aortic valve is tricuspid Aortic valve regurgitation is moderate by color flow Doppler. Pulmonic Valve: The pulmonic valve was normal in structure. Pulmonic valve regurgitation is not visualized by color flow Doppler. Aorta: There is mild dilatation of the aortic root measuring 40 mm. Venous: The inferior vena cava is normal in size with greater than 50% respiratory variability. Additional Findings: Since the prior study on 09/29/2017 LVEF has decreased from 40-45% to 20-25%.    Cardiac cath 2/2020There is moderate to severe left ventricular systolic dysfunction.   1.  Minimal luminal irregularities with no evidence of obstructive coronary artery disease. 2.  Moderately to severely reduced LV systolic function with an EF of 30 to 35% with global hypokinesis. 3.  Right heart catheterization showed mildly elevated filling pressures, mild pulmonary hypertension and mildly reduced cardiac output.  RA pressure: 7 mmHg, RV pressure 39 over 3 mmHg, pulmonary capillary wedge pressure: 15 mmHg, PA pressure: 39/11 with a mean of 24 mmHg.  Cardiac output was 3.45 with an index of 2.16.   Recommendations: The patient has nonischemic cardiomyopathy for which I recommend medical therapy.  Consider switching losartan to  Entresto and adding spironolactone.       ASSESSMENT:    1. DCM (dilated cardiomyopathy) (HCC)   2. Aortic valve disorder   3. Essential hypertension   4. Chronic combined systolic and diastolic CHF (congestive heart failure) (HCC)      PLAN:  In order of problems listed above:  Dilated cardiomyopathy LVEF 20 to 25% on echo 30 to 35% at cath now on Entresto, metoprolol.  Dr. Mayford Knife ordered MRI study to rule out TTR amyloid Nuclear imaging was equivocal. 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  He also takes care of his 70 year old mother.  He has been told at prior visits that he is high risk of poor outcomes if he contacts COVID 19.  He says he is willing to take that risk as he needs to pay bills.  He has no heart failure symptoms at this time but is not following a low-sodium diet.  He has been on Entresto for 3 months.  We will order an echo to see if he has had improvement in his LV function.  Also looking to get MRI scheduled.We will give him a note to return knowing that he is at high risk of complications and even death if he contracts COVID19. F/U with Dr. Mayford Knifeurner in 2 months  Aortic valve disease with history of endocarditis status post AVR with Ross procedure and descending aortic resection with replacement with Hemashield graft in 2001 recent echo with moderate AI-SBE prophylaxis  Essential hypertension BP controlled  Chronic combined systolic and diastolic CHF compensated. 2gm sodium diet education given    Medication Adjustments/Labs and Tests Ordered: Current medicines are reviewed at length with the patient today.  Concerns regarding medicines are outlined above.  Medication changes, Labs and Tests ordered today are listed in the Patient Instructions below. Patient Instructions   Medication Instructions:  Your physician recommends that you continue on your current medications as directed. Please refer to the Current  Medication list given to you today.  If you need a refill on your cardiac medications before your next appointment, please call your pharmacy.   Lab work: None Ordered  If you have labs (blood work) drawn today and your tests are completely normal, you will receive your results only by: Marland Kitchen. MyChart Message (if you have MyChart) OR . A paper copy in the mail If you have any lab test that is abnormal or we need to change your treatment, we will call you to review the results.  Testing/Procedures: Your physician has requested that you have an echocardiogram. Echocardiography is a painless test that uses sound waves to create images of your heart. It provides your doctor with information about the size and shape of your heart and how well your heart's chambers and valves are working. This procedure takes approximately one hour. There are no restrictions for this procedure.  Your physician has requested that you have a cardiac MRI. This has already been ordered. You will be contacted to schedule the appointment. Cardiac MRI uses a computer to create images of your heart as its beating, producing both still and moving pictures of your heart and major blood vessels. For further information please visit [email or web address]. Please follow the instruction sheet given to you today for more information.   Follow-Up: . Follow up with Dr. Mayford Knifeurner in 2 months  Any Other Special Instructions Will Be Listed Below (If Applicable).   Two Gram Sodium Diet 2000 mg  What is Sodium? Sodium is a mineral found naturally in many foods. The most significant source of sodium in the diet is table salt, which is about 40% sodium.  Processed, convenience, and preserved foods also contain a large amount of sodium.  The body needs only 500 mg of sodium daily to function,  A normal diet provides more than enough sodium even if you do not use salt.  Why Limit Sodium? A build up of sodium in the body can cause thirst, increased  blood pressure, shortness of breath, and water retention.  Decreasing sodium in the  diet can reduce edema and risk of heart attack or stroke associated with high blood pressure.  Keep in mind that there are many other factors involved in these health problems.  Heredity, obesity, lack of exercise, cigarette smoking, stress and what you eat all play a role.  General Guidelines:  Do not add salt at the table or in cooking.  One teaspoon of salt contains over 2 grams of sodium.  Read food labels  Avoid processed and convenience foods  Ask your dietitian before eating any foods not dicussed in the menu planning guidelines  Consult your physician if you wish to use a salt substitute or a sodium containing medication such as antacids.  Limit milk and milk products to 16 oz (2 cups) per day.  Shopping Hints:  READ LABELS!! "Dietetic" does not necessarily mean low sodium.  Salt and other sodium ingredients are often added to foods during processing.   Menu Planning Guidelines Food Group Choose More Often Avoid  Beverages (see also the milk group All fruit juices, low-sodium, salt-free vegetables juices, low-sodium carbonated beverages Regular vegetable or tomato juices, commercially softened water used for drinking or cooking  Breads and Cereals Enriched white, wheat, rye and pumpernickel bread, hard rolls and dinner rolls; muffins, cornbread and waffles; most dry cereals, cooked cereal without added salt; unsalted crackers and breadsticks; low sodium or homemade bread crumbs Bread, rolls and crackers with salted tops; quick breads; instant hot cereals; pancakes; commercial bread stuffing; self-rising flower and biscuit mixes; regular bread crumbs or cracker crumbs  Desserts and Sweets Desserts and sweets mad with mild should be within allowance Instant pudding mixes and cake mixes  Fats Butter or margarine; vegetable oils; unsalted salad dressings, regular salad dressings limited to 1 Tbs; light,  sour and heavy cream Regular salad dressings containing bacon fat, bacon bits, and salt pork; snack dips made with instant soup mixes or processed cheese; salted nuts  Fruits Most fresh, frozen and canned fruits Fruits processed with salt or sodium-containing ingredient (some dried fruits are processed with sodium sulfites        Vegetables Fresh, frozen vegetables and low- sodium canned vegetables Regular canned vegetables, sauerkraut, pickled vegetables, and others prepared in brine; frozen vegetables in sauces; vegetables seasoned with ham, bacon or salt pork  Condiments, Sauces, Miscellaneous  Salt substitute with physician's approval; pepper, herbs, spices; vinegar, lemon or lime juice; hot pepper sauce; garlic powder, onion powder, low sodium soy sauce (1 Tbs.); low sodium condiments (ketchup, chili sauce, mustard) in limited amounts (1 tsp.) fresh ground horseradish; unsalted tortilla chips, pretzels, potato chips, popcorn, salsa (1/4 cup) Any seasoning made with salt including garlic salt, celery salt, onion salt, and seasoned salt; sea salt, rock salt, kosher salt; meat tenderizers; monosodium glutamate; mustard, regular soy sauce, barbecue, sauce, chili sauce, teriyaki sauce, steak sauce, Worcestershire sauce, and most flavored vinegars; canned gravy and mixes; regular condiments; salted snack foods, olives, picles, relish, horseradish sauce, catsup   Food preparation: Try these seasonings Meats:    Pork Sage, onion Serve with applesauce  Chicken Poultry seasoning, thyme, parsley Serve with cranberry sauce  Lamb Curry powder, rosemary, garlic, thyme Serve with mint sauce or jelly  Veal Marjoram, basil Serve with current jelly, cranberry sauce  Beef Pepper, bay leaf Serve with dry mustard, unsalted chive butter  Fish Bay leaf, dill Serve with unsalted lemon butter, unsalted parsley butter  Vegetables:    Asparagus Lemon juice   Broccoli Lemon juice   Carrots Mustard dressing parsley,  mint, nutmeg, glazed with unsalted butter and sugar   Green beans Marjoram, lemon juice, nutmeg,dill seed   Tomatoes Basil, marjoram, onion   Spice /blend for [employer]" 4 tsp ground thyme 1 tsp ground sage 3 tsp ground rosemary 4 tsp ground marjoram   Test your knowledge 1. A product that says "Salt Free" may still contain sodium. True or False 2. Garlic Powder and Hot Pepper Sauce an be used as alternative seasonings.True or False 3. Processed foods have more sodium than fresh foods.  True or False 4. Canned Vegetables have less sodium than froze True or False  WAYS TO DECREASE YOUR SODIUM INTAKE 1. Avoid the use of added salt in cooking and at the table.  Table salt (and other prepared seasonings which contain salt) is probably one of the greatest sources of sodium in the diet.  Unsalted foods can gain flavor from the sweet, sour, and butter taste sensations of herbs and spices.  Instead of using salt for seasoning, try the following seasonings with the foods listed.  Remember: how you use them to enhance natural food flavors is limited only by your creativity... Allspice-Meat, fish, eggs, fruit, peas, red and yellow vegetables Almond Extract-Fruit baked goods Anise Seed-Sweet breads, fruit, carrots, beets, cottage cheese, cookies (tastes like licorice) Basil-Meat, fish, eggs, vegetables, rice, vegetables salads, soups, sauces Bay Leaf-Meat, fish, stews, poultry Burnet-Salad, vegetables (cucumber-like flavor) Caraway Seed-Bread, cookies, cottage cheese, meat, vegetables, cheese, rice Cardamon-Baked goods, fruit, soups Celery Powder or seed-Salads, salad dressings, sauces, meatloaf, soup, bread.Do not use  celery salt Chervil-Meats, salads, fish, eggs, vegetables, cottage cheese (parsley-like flavor) Chili Power-Meatloaf, chicken cheese, corn, eggplant, egg dishes Chives-Salads cottage cheese, egg dishes, soups, vegetables, sauces Cilantro-Salsa, casseroles Cinnamon-Baked goods,  fruit, pork, lamb, chicken, carrots Cloves-Fruit, baked goods, fish, pot roast, green beans, beets, carrots Coriander-Pastry, cookies, meat, salads, cheese (lemon-orange flavor) Cumin-Meatloaf, fish,cheese, eggs, cabbage,fruit pie (caraway flavor) [employer], fruit, eggs, fish, poultry, cottage cheese, vegetables Dill Seed-Meat, cottage cheese, poultry, vegetables, fish, salads, bread Fennel Seed-Bread, cookies, apples, pork, eggs, fish, beets, cabbage, cheese, Licorice-like flavor Garlic-(buds or powder) Salads, meat, poultry, fish, bread, butter, vegetables, potatoes.Do not  use garlic salt Ginger-Fruit, vegetables, baked goods, meat, fish, poultry Horseradish Root-Meet, vegetables, butter Lemon Juice or Extract-Vegetables, fruit, tea, baked goods, fish salads Mace-Baked goods fruit, vegetables, fish, poultry (taste like nutmeg) Maple Extract-Syrups Marjoram-Meat, chicken, fish, vegetables, breads, green salads (taste like Sage) Mint-Tea, lamb, sherbet, vegetables, desserts, carrots, cabbage Mustard, Dry or Seed-Cheese, eggs, meats, vegetables, poultry Nutmeg-Baked goods, fruit, chicken, eggs, vegetables, desserts Onion Powder-Meat, fish, poultry, vegetables, cheese, eggs, bread, rice salads (Do not use   Onion salt) Orange Extract-Desserts, baked goods Oregano-Pasta, eggs, cheese, onions, pork, lamb, fish, chicken, vegetables, green salads Paprika-Meat, fish, poultry, eggs, cheese, vegetables Parsley Flakes-Butter, vegetables, meat fish, poultry, eggs, bread, salads (certain forms may   Contain sodium Pepper-Meat fish, poultry, vegetables, eggs Peppermint Extract-Desserts, baked goods Poppy Seed-Eggs, bread, cheese, fruit dressings, baked goods, noodles, vegetables, cottage  [employer], poultry, meat, fish, cauliflower, turnips,eggs bread Saffron-Rice, bread, veal, chicken, fish, eggs Sage-Meat, fish, poultry, onions, eggplant,  tomateos, pork, stews Savory-Eggs, salads, poultry, meat, rice, vegetables, soups, pork Tarragon-Meat, poultry, fish, eggs, butter, vegetables (licorice-like flavor)  Thyme-Meat, poultry, fish, eggs, vegetables, (clover-like flavor), sauces, soups Tumeric-Salads, butter, eggs, fish, rice, vegetables (saffron-like flavor) Vanilla Extract-Baked goods, candy Vinegar-Salads, vegetables, meat marinades Walnut Extract-baked goods, candy  2. Choose your Foods Wisely   The following is a list of foods to avoid which  are high in sodium:  Meats-Avoid all smoked, canned, salt cured, dried and kosher meat and fish as well as Anchovies   Lox F[employer] SemiconductorBacon    Luncheon meats:Bologna, Liverwurst, Pastrami Canned meat or fish  Marinated herring Caviar    Pepperoni Corned Beef   Pizza Dried chipped beef  Salami Frozen breaded fish or meat Salt pork Frankfurters or hot dogs  Sardines Gefilte fish   Sausage Ham (boiled ham, Proscuitto Smoked butt    spiced ham)   Spam      TV Dinners Vegetables Canned vegetables (Regular) Relish Canned mushrooms  Sauerkraut Olives    Tomato juice Pickles  Bakery and Dessert Products Canned puddings  Cream pies Cheesecake   Decorated cakes Cookies  Beverages/Juices Tomato juice, regular  Gatorade   V-8 vegetable juice, regular  Breads and Cereals Biscuit mixes   Salted potato chips, corn chips, pretzels Bread stuffing mixes  Salted crackers and rolls Pancake and waffle mixes Self-rising flour  Seasonings Accent    Meat sauces Barbecue sauce  Meat tenderizer Catsup    Monosodium glutamate (MSG) Celery salt   Onion salt Chili sauce   Prepared mustard Garlic salt   Salt, seasoned salt, sea salt Gravy mixes   Soy sauce Horseradish   Steak sauce Ketchup   Tartar sauce Lite salt    Teriyaki sauce Marinade mixes   Worcestershire sauce  Others Baking powder   Cocoa and cocoa mixes Baking soda   Commercial casserole mixes Candy-caramels, chocolate  Dehydrated  soups    Bars, fudge,nougats  Instant rice and pasta mixes Canned broth or soup  Maraschino cherries Cheese, aged and processed cheese and cheese spreads  Learning Assessment Quiz  Indicated T (for True) or F (for False) for each of the following statements:  1. _____ Fresh fruits and vegetables and unprocessed grains are generally low in sodium 2. _____ Water may contain a considerable amount of sodium, depending on the source 3. _____ You can always tell if a food is high in sodium by tasting it 4. _____ Certain laxatives my be high in sodium and should be avoided unless prescribed   by a physician or pharmacist 5. _____ Salt substitutes may be used freely by anyone on a sodium restricted diet 6. _____ Sodium is present in table salt, food additives and as a natural component of   most foods 7. _____ Table salt is approximately 90% sodium 8. _____ Limiting sodium intake may help prevent excess fluid accumulation in the body 9. _____ On a sodium-restricted diet, seasonings such as bouillon soy sauce, and    cooking wine should be used in place of table salt 10. _____ On an ingredient list, a product which lists monosodium glutamate as the first   ingredient is an appropriate food to include on a low sodium diet  Circle the best answer(s) to the following statements (Hint: there may be more than one correct answer)  11. On a low-sodium diet, some acceptable snack items are:    A. Olives  F. Bean dip   K. Grapefruit juice    B. Salted Pretzels G. Commercial Popcorn   L. Canned peaches    C. Carrot Sticks  H. Bouillon   M. Unsalted nuts   D. [place] fries  I. Peanut butter crackers N. Salami   E. Sweet pickles J. Tomato Juice   O. Pizza  12.  Seasonings that may be used freely on a reduced - sodium diet include   A. Lemon wedges F.Monosodium  glutamate K. Celery seed    B.Soysauce   G. Pepper   L. Mustard powder   C. Sea salt  H. Cooking wine  M. Onion flakes   D. Vinegar  E.  Prepared horseradish N. Salsa   E. Sage   J. Worcestershire sauce  O. Chutney     Signed, Ermalinda Barrios, PA-C  09/12/2018 9:18 AM    [facility] [place], [place], [place]  [address] Phone: [phone]; Fax: [phone]

## 2018-09-12 ENCOUNTER — Other Ambulatory Visit: Payer: Self-pay

## 2018-09-12 ENCOUNTER — Telehealth: Payer: Self-pay | Admitting: *Deleted

## 2018-09-12 ENCOUNTER — Encounter (INDEPENDENT_AMBULATORY_CARE_PROVIDER_SITE_OTHER): Payer: Self-pay

## 2018-09-12 ENCOUNTER — Encounter: Payer: Self-pay | Admitting: Physician Assistant

## 2018-09-12 ENCOUNTER — Ambulatory Visit: Payer: BLUE CROSS/BLUE SHIELD | Admitting: Physician Assistant

## 2018-09-12 ENCOUNTER — Encounter: Payer: Self-pay | Admitting: Cardiology

## 2018-09-12 VITALS — BP 122/60 | HR 67 | Ht 61.0 in | Wt 133.1 lb

## 2018-09-12 DIAGNOSIS — I359 Nonrheumatic aortic valve disorder, unspecified: Secondary | ICD-10-CM

## 2018-09-12 DIAGNOSIS — I420 Dilated cardiomyopathy: Secondary | ICD-10-CM | POA: Diagnosis not present

## 2018-09-12 DIAGNOSIS — I5042 Chronic combined systolic (congestive) and diastolic (congestive) heart failure: Secondary | ICD-10-CM

## 2018-09-12 DIAGNOSIS — Z01812 Encounter for preprocedural laboratory examination: Secondary | ICD-10-CM

## 2018-09-12 DIAGNOSIS — I77810 Thoracic aortic ectasia: Secondary | ICD-10-CM

## 2018-09-12 DIAGNOSIS — I10 Essential (primary) hypertension: Secondary | ICD-10-CM | POA: Diagnosis not present

## 2018-09-12 NOTE — Patient Instructions (Addendum)
Medication Instructions:  Your physician recommends that you continue on your current medications as directed. Please refer to the Current Medication list given to you today.  If you need a refill on your cardiac medications before your next appointment, please call your pharmacy.   Lab work: None Ordered  If you have labs (blood work) drawn today and your tests are completely normal, you will receive your results only by: Marland Kitchen. MyChart Message (if you have MyChart) OR . A paper copy in the mail If you have any lab test that is abnormal or we need to change your treatment, we will call you to review the results.  Testing/Procedures: Your physician has requested that you have an echocardiogram. Echocardiography is a painless test that uses sound waves to create images of your heart. It provides your doctor with information about the size and shape of your heart and how well your heart's chambers and valves are working. This procedure takes approximately one hour. There are no restrictions for this procedure.  Your physician has requested that you have a cardiac MRI. This has already been ordered. You will be contacted to schedule the appointment. Cardiac MRI uses a computer to create images of your heart as its beating, producing both still and moving pictures of your heart and major blood vessels. For further information please visit [email or web address]. Please follow the instruction sheet given to you today for more information.   Follow-Up: . Follow up with Dr. Mayford Knifeurner in 2 months  Any Other Special Instructions Will Be Listed Below (If Applicable).   Two Gram Sodium Diet 2000 mg  What is Sodium? Sodium is a mineral found naturally in many foods. The most significant source of sodium in the diet is table salt, which is about 40% sodium.  Processed, convenience, and preserved foods also contain a large amount of sodium.  The body needs only 500 mg of sodium daily to function,  A normal diet  provides more than enough sodium even if you do not use salt.  Why Limit Sodium? A build up of sodium in the body can cause thirst, increased blood pressure, shortness of breath, and water retention.  Decreasing sodium in the diet can reduce edema and risk of heart attack or stroke associated with high blood pressure.  Keep in mind that there are many other factors involved in these health problems.  Heredity, obesity, lack of exercise, cigarette smoking, stress and what you eat all play a role.  General Guidelines:  Do not add salt at the table or in cooking.  One teaspoon of salt contains over 2 grams of sodium.  Read food labels  Avoid processed and convenience foods  Ask your dietitian before eating any foods not dicussed in the menu planning guidelines  Consult your physician if you wish to use a salt substitute or a sodium containing medication such as antacids.  Limit milk and milk products to 16 oz (2 cups) per day.  Shopping Hints:  READ LABELS!! "Dietetic" does not necessarily mean low sodium.  Salt and other sodium ingredients are often added to foods during processing.   Menu Planning Guidelines Food Group Choose More Often Avoid  Beverages (see also the milk group All fruit juices, low-sodium, salt-free vegetables juices, low-sodium carbonated beverages Regular vegetable or tomato juices, commercially softened water used for drinking or cooking  Breads and Cereals Enriched white, wheat, rye and pumpernickel bread, hard rolls and dinner rolls; muffins, cornbread and waffles; most dry cereals, cooked cereal without added  salt; unsalted crackers and breadsticks; low sodium or homemade bread crumbs Bread, rolls and crackers with salted tops; quick breads; instant hot cereals; pancakes; commercial bread stuffing; self-rising flower and biscuit mixes; regular bread crumbs or cracker crumbs  Desserts and Sweets Desserts and sweets mad with mild should be within allowance Instant  pudding mixes and cake mixes  Fats Butter or margarine; vegetable oils; unsalted salad dressings, regular salad dressings limited to 1 Tbs; light, sour and heavy cream Regular salad dressings containing bacon fat, bacon bits, and salt pork; snack dips made with instant soup mixes or processed cheese; salted nuts  Fruits Most fresh, frozen and canned fruits Fruits processed with salt or sodium-containing ingredient (some dried fruits are processed with sodium sulfites        Vegetables Fresh, frozen vegetables and low- sodium canned vegetables Regular canned vegetables, sauerkraut, pickled vegetables, and others prepared in brine; frozen vegetables in sauces; vegetables seasoned with ham, bacon or salt pork  Condiments, Sauces, Miscellaneous  Salt substitute with physician's approval; pepper, herbs, spices; vinegar, lemon or lime juice; hot pepper sauce; garlic powder, onion powder, low sodium soy sauce (1 Tbs.); low sodium condiments (ketchup, chili sauce, mustard) in limited amounts (1 tsp.) fresh ground horseradish; unsalted tortilla chips, pretzels, potato chips, popcorn, salsa (1/4 cup) Any seasoning made with salt including garlic salt, celery salt, onion salt, and seasoned salt; sea salt, rock salt, kosher salt; meat tenderizers; monosodium glutamate; mustard, regular soy sauce, barbecue, sauce, chili sauce, teriyaki sauce, steak sauce, Worcestershire sauce, and most flavored vinegars; canned gravy and mixes; regular condiments; salted snack foods, olives, picles, relish, horseradish sauce, catsup   Food preparation: Try these seasonings Meats:    Pork Sage, onion Serve with applesauce  Chicken Poultry seasoning, thyme, parsley Serve with cranberry sauce  Lamb Curry powder, rosemary, garlic, thyme Serve with mint sauce or jelly  Veal Marjoram, basil Serve with current jelly, cranberry sauce  Beef Pepper, bay leaf Serve with dry mustard, unsalted chive butter  Fish [place] leaf, dill Serve with  unsalted lemon butter, unsalted parsley butter  Vegetables:    Asparagus Lemon juice   Broccoli Lemon juice   Carrots Mustard dressing parsley, mint, nutmeg, glazed with unsalted butter and sugar   Green beans Marjoram, lemon juice, nutmeg,dill seed   Tomatoes Basil, marjoram, onion   Spice /blend for [employer]"Salt Shaker" 4 tsp ground thyme 1 tsp ground sage 3 tsp ground rosemary 4 tsp ground marjoram   Test your knowledge 1. A product that says "Salt Free" may still contain sodium. True or False 2. Garlic Powder and Hot Pepper Sauce an be used as alternative seasonings.True or False 3. Processed foods have more sodium than fresh foods.  True or False 4. Canned Vegetables have less sodium than froze True or False  WAYS TO DECREASE YOUR SODIUM INTAKE 1. Avoid the use of added salt in cooking and at the table.  Table salt (and other prepared seasonings which contain salt) is probably one of the greatest sources of sodium in the diet.  Unsalted foods can gain flavor from the sweet, sour, and butter taste sensations of herbs and spices.  Instead of using salt for seasoning, try the following seasonings with the foods listed.  Remember: how you use them to enhance natural food flavors is limited only by your creativity... Allspice-Meat, fish, eggs, fruit, peas, red and yellow vegetables Almond Extract-Fruit baked goods Anise Seed-Sweet breads, fruit, carrots, beets, cottage cheese, cookies (tastes like licorice) Basil-Meat, fish, eggs, vegetables,  rice, vegetables salads, soups, sauces Bay Leaf-Meat, fish, stews, poultry Burnet-Salad, vegetables (cucumber-like flavor) Caraway Seed-Bread, cookies, cottage cheese, meat, vegetables, cheese, rice Cardamon-Baked goods, fruit, soups Celery Powder or seed-Salads, salad dressings, sauces, meatloaf, soup, bread.Do not use  celery salt Chervil-Meats, salads, fish, eggs, vegetables, cottage cheese (parsley-like flavor) Chili Power-Meatloaf, chicken cheese,  corn, eggplant, egg dishes Chives-Salads cottage cheese, egg dishes, soups, vegetables, sauces Cilantro-Salsa, casseroles Cinnamon-Baked goods, fruit, pork, lamb, chicken, carrots Cloves-Fruit, baked goods, fish, pot roast, green beans, beets, carrots Coriander-Pastry, cookies, meat, salads, cheese (lemon-orange flavor) Cumin-Meatloaf, fish,cheese, eggs, cabbage,fruit pie (caraway flavor) [employer], fruit, eggs, fish, poultry, cottage cheese, vegetables Dill Seed-Meat, cottage cheese, poultry, vegetables, fish, salads, bread Fennel Seed-Bread, cookies, apples, pork, eggs, fish, beets, cabbage, cheese, Licorice-like flavor Garlic-(buds or powder) Salads, meat, poultry, fish, bread, butter, vegetables, potatoes.Do not  use garlic salt Ginger-Fruit, vegetables, baked goods, meat, fish, poultry Horseradish Root-Meet, vegetables, butter Lemon Juice or Extract-Vegetables, fruit, tea, baked goods, fish salads Mace-Baked goods fruit, vegetables, fish, poultry (taste like nutmeg) Maple Extract-Syrups Marjoram-Meat, chicken, fish, vegetables, breads, green salads (taste like Sage) Mint-Tea, lamb, sherbet, vegetables, desserts, carrots, cabbage Mustard, Dry or Seed-Cheese, eggs, meats, vegetables, poultry Nutmeg-Baked goods, fruit, chicken, eggs, vegetables, desserts Onion Powder-Meat, fish, poultry, vegetables, cheese, eggs, bread, rice salads (Do not use   Onion salt) Orange Extract-Desserts, baked goods Oregano-Pasta, eggs, cheese, onions, pork, lamb, fish, chicken, vegetables, green salads Paprika-Meat, fish, poultry, eggs, cheese, vegetables Parsley Flakes-Butter, vegetables, meat fish, poultry, eggs, bread, salads (certain forms may   Contain sodium Pepper-Meat fish, poultry, vegetables, eggs Peppermint Extract-Desserts, baked goods Poppy Seed-Eggs, bread, cheese, fruit dressings, baked goods, noodles, vegetables, cottage  [employer],  poultry, meat, fish, cauliflower, turnips,eggs bread Saffron-Rice, bread, veal, chicken, fish, eggs Sage-Meat, fish, poultry, onions, eggplant, tomateos, pork, stews Savory-Eggs, salads, poultry, meat, rice, vegetables, soups, pork Tarragon-Meat, poultry, fish, eggs, butter, vegetables (licorice-like flavor)  Thyme-Meat, poultry, fish, eggs, vegetables, (clover-like flavor), sauces, soups Tumeric-Salads, butter, eggs, fish, rice, vegetables (saffron-like flavor) Vanilla Extract-Baked goods, candy Vinegar-Salads, vegetables, meat marinades Walnut Extract-baked goods, candy  2. Choose your Foods Wisely   The following is a list of foods to avoid which are high in sodium:  Meats-Avoid all smoked, canned, salt cured, dried and kosher meat and fish as well as Anchovies   [employer] meats:Bologna, Liverwurst, Pastrami Canned meat or fish  Marinated herring Caviar    Pepperoni Corned Beef   Pizza Dried chipped beef  Salami Frozen breaded fish or meat Salt pork Frankfurters or hot dogs  Sardines Gefilte fish   Sausage Ham (boiled ham, Proscuitto Smoked butt    spiced ham)   Spam      TV Dinners Vegetables Canned vegetables (Regular) Relish Canned mushrooms  Sauerkraut Olives    Tomato juice Pickles  Bakery and Dessert Products Canned puddings  Cream pies Cheesecake   Decorated cakes Cookies  Beverages/Juices Tomato juice, regular  Gatorade   V-8 vegetable juice, regular  Breads and Cereals Biscuit mixes   Salted potato chips, corn chips, pretzels Bread stuffing mixes  Salted crackers and rolls Pancake and waffle mixes Self-rising flour  Seasonings Accent    Meat sauces Barbecue sauce  Meat tenderizer Catsup    Monosodium glutamate (MSG) Celery salt   Onion salt Chili sauce   Prepared mustard Garlic salt   Salt, seasoned salt, sea salt Gravy mixes   Soy sauce Horseradish   Steak sauce Ketchup   Tartar sauce Lite salt  Teriyaki sauce Marinade  mixes   Worcestershire sauce  Others Baking powder   Cocoa and cocoa mixes Baking soda   Commercial casserole mixes Candy-caramels, chocolate  Dehydrated soups    Bars, fudge,nougats  Instant rice and pasta mixes Canned broth or soup  Maraschino cherries Cheese, aged and processed cheese and cheese spreads  Learning Assessment Quiz  Indicated T (for True) or F (for False) for each of the following statements:  1. _____ Fresh fruits and vegetables and unprocessed grains are generally low in sodium 2. _____ Water may contain a considerable amount of sodium, depending on the source 3. _____ You can always tell if a food is high in sodium by tasting it 4. _____ Certain laxatives my be high in sodium and should be avoided unless prescribed   by a physician or pharmacist 5. _____ Salt substitutes may be used freely by anyone on a sodium restricted diet 6. _____ Sodium is present in table salt, food additives and as a natural component of   most foods 7. _____ Table salt is approximately 90% sodium 8. _____ Limiting sodium intake may help prevent excess fluid accumulation in the body 9. _____ On a sodium-restricted diet, seasonings such as bouillon soy sauce, and    cooking wine should be used in place of table salt 10. _____ On an ingredient list, a product which lists monosodium glutamate as the first   ingredient is an appropriate food to include on a low sodium diet  Circle the best answer(s) to the following statements (Hint: there may be more than one correct answer)  11. On a low-sodium diet, some acceptable snack items are:    A. Olives  F. Bean dip   K. Grapefruit juice    B. Salted Pretzels G. Commercial Popcorn   L. Canned peaches    C. Carrot Sticks  H. Bouillon   M. Unsalted nuts   D. [place] fries  I. Peanut butter crackers N. Salami   E. Sweet pickles J. Tomato Juice   O. Pizza  12.  Seasonings that may be used freely on a reduced - sodium diet include   A. Lemon  wedges F.Monosodium glutamate K. Celery seed    B.Soysauce   G. Pepper   L. Mustard powder   C. Sea salt  H. Cooking wine  M. Onion flakes   D. Vinegar  E. Prepared horseradish N. Salsa   E. Sage   J. Worcestershire sauce  O. Chutney

## 2018-09-12 NOTE — Telephone Encounter (Signed)
Spoke with [place] regarding orders for Chest MRA and lab work for this patient

## 2018-09-12 NOTE — Addendum Note (Signed)
Addended by: Drue Novel I on: 09/12/2018 10:15 AM   Modules accepted: Orders

## 2018-09-12 NOTE — Telephone Encounter (Signed)
Spoke with patient regarding appointment for Cardiac MRI scheduled for 09/26/18 at 3:00pm--patient also scheduled for BMET on 09/17/18 at 4:00pm at [facility].  Will mail l the information to the patient and he voiced his understanding.

## 2018-09-17 ENCOUNTER — Other Ambulatory Visit: Payer: BLUE CROSS/BLUE SHIELD

## 2018-09-17 ENCOUNTER — Other Ambulatory Visit: Payer: Self-pay

## 2018-09-17 DIAGNOSIS — I420 Dilated cardiomyopathy: Secondary | ICD-10-CM

## 2018-09-17 DIAGNOSIS — I10 Essential (primary) hypertension: Secondary | ICD-10-CM

## 2018-09-17 DIAGNOSIS — I77810 Thoracic aortic ectasia: Secondary | ICD-10-CM

## 2018-09-17 DIAGNOSIS — Z01812 Encounter for preprocedural laboratory examination: Secondary | ICD-10-CM

## 2018-09-17 DIAGNOSIS — I5042 Chronic combined systolic (congestive) and diastolic (congestive) heart failure: Secondary | ICD-10-CM

## 2018-09-17 DIAGNOSIS — I359 Nonrheumatic aortic valve disorder, unspecified: Secondary | ICD-10-CM

## 2018-09-17 LAB — BASIC METABOLIC PANEL
BUN/Creatinine Ratio: 27 — ABNORMAL HIGH (ref 10–24)
BUN: 33 mg/dL — ABNORMAL HIGH (ref 8–27)
CO2: 21 mmol/L (ref 20–29)
Calcium: 9 mg/dL (ref 8.6–10.2)
Chloride: 106 mmol/L (ref 96–106)
Creatinine, Ser: 1.23 mg/dL (ref 0.76–1.27)
GFR calc Af Amer: 68 mL/min/{1.73_m2} (ref >59)
GFR calc non Af Amer: 59 mL/min/{1.73_m2} — ABNORMAL LOW (ref >59)
Glucose: 98 mg/dL (ref 65–99)
Potassium: 5.5 mmol/L — ABNORMAL HIGH (ref 3.5–5.2)
Sodium: 139 mmol/L (ref 134–144)

## 2018-09-24 ENCOUNTER — Ambulatory Visit (HOSPITAL_COMMUNITY): Payer: BLUE CROSS/BLUE SHIELD | Attending: Internal Medicine

## 2018-09-24 ENCOUNTER — Other Ambulatory Visit: Payer: Self-pay

## 2018-09-24 DIAGNOSIS — I420 Dilated cardiomyopathy: Secondary | ICD-10-CM | POA: Diagnosis not present

## 2018-09-24 DIAGNOSIS — I10 Essential (primary) hypertension: Secondary | ICD-10-CM | POA: Diagnosis not present

## 2018-09-24 DIAGNOSIS — I5042 Chronic combined systolic (congestive) and diastolic (congestive) heart failure: Secondary | ICD-10-CM | POA: Diagnosis not present

## 2018-09-24 DIAGNOSIS — I359 Nonrheumatic aortic valve disorder, unspecified: Secondary | ICD-10-CM | POA: Diagnosis not present

## 2018-09-25 ENCOUNTER — Telehealth: Payer: Self-pay

## 2018-09-25 NOTE — Telephone Encounter (Signed)
Notes recorded by Dorothy Spark, MD on 09/19/2018 at 4:30 PM EDT  He has elevated BUN, creatinine and potassium, I will discontinue spironolactone.

## 2018-09-25 NOTE — Telephone Encounter (Signed)
Spoke with the patient, he expressed understanding and will monitor his blood pressure and call if it is elevated.

## 2018-09-26 ENCOUNTER — Other Ambulatory Visit: Payer: Self-pay

## 2018-09-26 ENCOUNTER — Ambulatory Visit (HOSPITAL_COMMUNITY)
Admission: RE | Admit: 2018-09-26 | Discharge: 2018-09-26 | Disposition: A | Discharge status: Home / Self Care | Payer: BLUE CROSS/BLUE SHIELD | Source: Ambulatory Visit | Attending: Cardiology | Admitting: Cardiology

## 2018-09-26 DIAGNOSIS — I359 Nonrheumatic aortic valve disorder, unspecified: Secondary | ICD-10-CM | POA: Insufficient documentation

## 2018-09-26 DIAGNOSIS — I420 Dilated cardiomyopathy: Secondary | ICD-10-CM | POA: Insufficient documentation

## 2018-09-26 DIAGNOSIS — I5023 Acute on chronic systolic (congestive) heart failure: Secondary | ICD-10-CM | POA: Insufficient documentation

## 2018-09-26 DIAGNOSIS — I77810 Thoracic aortic ectasia: Secondary | ICD-10-CM | POA: Diagnosis not present

## 2018-09-26 MED ORDER — GADOBUTROL 1 MMOL/ML IV SOLN
7.0000 mL | Freq: Once | INTRAVENOUS | Status: AC | PRN
  Administered 2018-09-26: 7 mL via INTRAVENOUS

## 2018-09-27 ENCOUNTER — Telehealth: Payer: Self-pay | Admitting: Cardiology

## 2018-09-27 NOTE — Telephone Encounter (Signed)
New message   Patient is calling to get MRI Results and wants to have a note written to go back work. Please call.

## 2018-09-27 NOTE — Telephone Encounter (Signed)
MRI has not been reported out yet

## 2018-09-28 NOTE — Telephone Encounter (Signed)
Informed the pt that per Dr Radford Pax, MRI has not been reported out yet.  Informed the pt once this occurs, we will follow-up with him shortly thereafter with her recommendations. Pt verbalized understanding and agrees with this plan.

## 2018-10-01 ENCOUNTER — Telehealth: Payer: Self-pay

## 2018-10-01 DIAGNOSIS — I77810 Thoracic aortic ectasia: Secondary | ICD-10-CM

## 2018-10-01 NOTE — Telephone Encounter (Signed)
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 Moderate leakiness of the AV. 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 Repeat echo in 1 year

## 2018-10-01 NOTE — Telephone Encounter (Signed)
Work note written and placed up front for pick up.

## 2018-10-01 NOTE — Telephone Encounter (Signed)
Spoke with the patient, in the office, he gave paperwork to sign for his work. Placed in Dr. Theodosia Blender box. He expressed understanding about his results.

## 2018-10-01 NOTE — Telephone Encounter (Signed)
yes

## 2018-10-01 NOTE — Telephone Encounter (Signed)
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  Moderate leakiness of the AV.  Hepatic cyst  - forward to PCP.  Repeat echo in 1 year

## 2018-10-01 NOTE — Telephone Encounter (Signed)
Follow up    Patient is calling to obtain MRI results so that he can return back to work.

## 2018-10-04 ENCOUNTER — Telehealth: Payer: Self-pay | Admitting: Cardiology

## 2018-10-04 NOTE — Telephone Encounter (Signed)
New message:    Patient calling concerning some paper work that was dropped off. Please call patient.

## 2018-10-04 NOTE — Telephone Encounter (Signed)
Advised the patient, that Dr. Radford Pax is not in the office until 10/08/18. She will review her paperwork at that time.

## 2018-10-09 ENCOUNTER — Ambulatory Visit: Payer: BLUE CROSS/BLUE SHIELD | Admitting: Cardiology

## 2018-10-09 NOTE — Telephone Encounter (Signed)
Follow Up  Patient calling back in reference to the paperwork needed for him to be able to return to work. Please give patient a call back.

## 2018-10-10 NOTE — Telephone Encounter (Signed)
°  Patient is calling to ask about his paperwork. He thought he was going to get a call from the office yesterday but did not hear anything.

## 2018-10-15 ENCOUNTER — Encounter: Payer: Self-pay | Admitting: *Deleted

## 2018-10-15 NOTE — Telephone Encounter (Signed)
I called and left patient a message advising him that paperwork was ready to pick up. Placed up front for pick up.

## 2018-11-13 ENCOUNTER — Encounter: Payer: Self-pay | Admitting: Cardiology

## 2018-11-13 ENCOUNTER — Telehealth (INDEPENDENT_AMBULATORY_CARE_PROVIDER_SITE_OTHER): Payer: BLUE CROSS/BLUE SHIELD | Admitting: Cardiology

## 2018-11-13 ENCOUNTER — Other Ambulatory Visit: Payer: Self-pay

## 2018-11-13 DIAGNOSIS — I420 Dilated cardiomyopathy: Secondary | ICD-10-CM

## 2018-11-13 DIAGNOSIS — I77810 Thoracic aortic ectasia: Secondary | ICD-10-CM | POA: Diagnosis not present

## 2018-11-13 DIAGNOSIS — I110 Hypertensive heart disease with heart failure: Secondary | ICD-10-CM

## 2018-11-13 DIAGNOSIS — I359 Nonrheumatic aortic valve disorder, unspecified: Secondary | ICD-10-CM

## 2018-11-13 DIAGNOSIS — I5042 Chronic combined systolic (congestive) and diastolic (congestive) heart failure: Secondary | ICD-10-CM | POA: Diagnosis not present

## 2018-11-13 DIAGNOSIS — I10 Essential (primary) hypertension: Secondary | ICD-10-CM

## 2018-11-13 MED ORDER — METOPROLOL SUCCINATE ER 200 MG PO TB24: 200.0000 mg | ORAL_TABLET | Freq: Every day | ORAL | 3 refills | Status: DC

## 2018-11-13 MED ORDER — SACUBITRIL-VALSARTAN 49-51 MG PO TABS: 1.0000 | ORAL_TABLET | Freq: Two times a day (BID) | ORAL | 3 refills | Status: DC

## 2018-11-13 NOTE — Progress Notes (Signed)
Virtual Visit via telephone Note   This visit type was conducted due to national recommendations for restrictions regarding the COVID-19 Pandemic (e.g. social distancing) in an effort to limit this patient's exposure and mitigate transmission in our community.  Due to his co-morbid illnesses, this patient is at least at moderate risk for complications without adequate follow up.  This format is felt to be most appropriate for this patient at this time.  All issues noted in this document were discussed and addressed.  A limited physical exam was performed with this format.  Please refer to the patient's chart for his consent to telehealth for [facility].  Evaluation Performed:  Follow-up visit  This visit type was conducted due to national recommendations for restrictions regarding the COVID-19 Pandemic (e.g. social distancing).  This format is felt to be most appropriate for this patient at this time.  All issues noted in this document were discussed and addressed.  No physical exam was performed (except for noted visual exam findings with Video Visits).  Please refer to the patient's chart (MyChart message for video visits and phone note for telephone visits) for the patient's consent to telehealth for [facility].  Date:  11/13/2018   ID:  Dale Sanford, DOB April 22, 1950, MRN [id number]  Patient Location:  Home  Provider location:   [place]  PCP:  Aretta Nip, MD  Cardiologist:  Fransico Him, MD  Electrophysiologist:  None   Chief Complaint:  CHF, HTN, AVR, DCM  History of Present Illness:    Dale Sanford is a 68 y.o. male who presents via audio/video conferencing for a telehealth visit today.   Dale Sanford is a 68 y.o. 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  2D echocardiogram 12/01/2015 showed mildly reduced LV systolic function with EF 40 to  45% with diffuse hypokinesis and grade 2 diastolic dysfunction.  He had a stable aortic valve bioprosthesis at that time and aortic root was mildly dilated at 39 mm.  Nuclear stress test for preoperative cardiac clearance showed an EF of 44% with no ischemia.   Echo 2/11/2020showed that LVEF decreased from 40-45% to 20-25% with moderate MR and moderate aortic regurgitation.His Losartan was stopped and started Entresto and spironolactone and underwent right and LHC which showed minimal luminal irregularities with no evidence of obstructive coronary artery disease.There was moderately to severely reduced LV systolic function with an EF of 30 to 35% with global hypokinesis. Right heart catheterization showed mildly elevated filling pressures, mild pulmonary hypertension and mildly reduced cardiac output. RA pressure: 7 mmHg, RV pressure 39 over 3 mmHg, pulmonary capillary wedge pressure: 15 mmHg, PA pressure: 39/11 with a mean of 24 mmHg. Cardiac output was 3.45 with an index of 2.16.He was also ordered to get nuclear imaging to assess for TTR amyloid but study on 05/31/18 was equivocal. He underwent cardiac MRI which which showed EF 40% with no evidence of cardiac amyloid and no late Gad enhancement to suggest infarct or infiltrative process.  Aortic root was dilated at 40mm.    He is here today for followup and is doing well.  He denies any chest pain or pressure, PND, orthopnea, LE edema, dizziness, palpitations or syncope. He has chronic DOE that he says has not changed.  He is compliant with his meds and is tolerating meds with no SE.  He goes outside and mows his  yard and still works.   The patient does not have symptoms concerning for COVID-19 infection (fever, chills, cough, or new shortness of breath).    Prior CV studies:   The following studies were reviewed today:  Cardiac MRi  Past Medical History:  Diagnosis Date  . Aortic valve disorders 09/03/2013   s/p endocarditis of AV  complicated by AI S/P AVR autograft w pulmonic tissue valve (Ross Procedure) 12/1999  . 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 DCM (dilated cardiomyopathy) (HCC) 09/13/2017   EF 40-45% by echo 2017 with nuclear stress test showing no ischemia  . Dilated aortic root (HCC) 09/13/2017   45mm by echo 2017  . GERD (gastroesophageal reflux disease)   . Hypertension   . Poor dental hygiene    Past Surgical History:  Procedure Laterality Date  . DESCENDING AORTIC resesection, replacement  12/1999   Hemashield Graft  . 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 RIGHT/LEFT HEART CATH AND CORONARY ANGIOGRAPHY N/A 05/11/2018   Procedure: RIGHT/LEFT HEART CATH AND CORONARY ANGIOGRAPHY;  Surgeon: Iran Ouch, MD;  Location: [facility] INVASIVE CV LAB;  Service: Cardiovascular;  Laterality: N/A;  . TOTAL HIP ARTHROPLASTY Right 02/18/2016   Procedure: RIGHT TOTAL HIP ARTHROPLASTY ANTERIOR APPROACH;  Surgeon: Samson Frederic, MD;  Location: [facility] ORS;  Service: Orthopedics;  Laterality: Right;     Current Meds  Medication Sig  . aspirin EC 81 MG tablet Take 1 tablet (81 mg total) by mouth daily.  . metoprolol (TOPROL XL) 200 MG 24 hr tablet Take 1 tablet (200 mg total) by mouth daily.  . sacubitril-valsartan (ENTRESTO) 49-51 MG Take 1 tablet by mouth 2 (two) times daily.     Allergies:   Nyquil multi-symptom [pseudoeph-doxylamine-dm-apap]   Social History   Tobacco Use  . Smoking status: Never Smoker  . Smokeless tobacco: Never Used  Substance Use Topics  . Alcohol use: No    Comment: quit in 2008  . Drug use: No     Family Hx: The patient's family history includes CAD in his father; Cancer in his brother; Cirrhosis in his brother.  ROS:   Please see the history of present illness.     All other systems reviewed and are negative.   Labs/Other Tests and Data Reviewed:    Recent Labs: 05/07/2018: Platelets 215 05/11/2018: Hemoglobin  12.2 09/17/2018: BUN 33; Creatinine, Ser 1.23; Potassium 5.5; Sodium 139   Recent Lipid Panel No results found for: CHOL, TRIG, HDL, CHOLHDL, LDLCALC, LDLDIRECT  Wt Readings from Last 3 Encounters:  11/13/18 139 lb (63 kg)  09/12/18 133 lb 1.9 oz (60.4 kg)  07/23/18 138 lb (62.6 kg)     Objective:    Vital Signs:  BP 121/88   Pulse 69   Ht 5\' 2"  (1.575 m)   Wt 139 lb (63 kg)   BMI 25.42 kg/m      ASSESSMENT & PLAN:    1.  Nonischemic DCM - cardiac cath 04/2018 showed normal coornary arteries.  Cardiac MRI with no evidence of infiltrative disease or infarct.  EF was 40%.  Continue on BB and Entresto.    2.  AV disease - s/p endocarditis complicated by AI s/p AVR autograft with Pulmonic tissue valve 2001.  2D echo 09/2000 showed stable AVR and PVR  3.  Descending aortic aneurysm - s/p aortic resection with replacement with Hemashield graft in 2001.  Cardiac MRI recently showed 46mm aortic root dilatation.  Repeat  2D echo in 1 year for followup.   4.  Hypertension - BP is well controlled.  He will continue on BB and Entresto.    6.  Combined systolic/diastolic CHF - he is doing well and has not had any SOB or LE edema.  His weight has been stable.  He will continue on Entresto 49-51mg  BID and Toprol 200mg  daily.  He has not required any diuretic therapy.  Creatinine was 1.23 on 09/17/2018.  Cannot use spiro due to hx of hyperkalemia on it before.  COVID-19 Education: The signs and symptoms of COVID-19 were discussed with the patient and how to seek care for testing (follow up with PCP or arrange E-visit).  The importance of social distancing was discussed today.  Patient Risk:   After full review of this patient's clinical status, I feel that they are at least moderate risk at this time.  Time:   Today, I have spent 20 minutes  on telemedicine discussing medical problems including DCM, CHF, HTN, AVR.  We also reviewed the symptoms of COVID 19 and the ways to protect against  contracting the virus with telehealth technology.  I spent an additional 5 minutes reviewing patient's chart including cardiac MRI and 2D echo.  Medication Adjustments/Labs and Tests Ordered: Current medicines are reviewed at length with the patient today.  Concerns regarding medicines are outlined above.  Tests Ordered: No orders of the defined types were placed in this encounter.  Medication Changes: No orders of the defined types were placed in this encounter.   Disposition:  Follow up in 6 month(s) with PA and 1 year with me  Signed, Armanda Magicraci Turner, MD  11/13/2018 8:21 AM    [facility]

## 2018-11-13 NOTE — Patient Instructions (Signed)
Medication Instructions:  The current medical regimen is effective;  continue present plan and medications.  If you need a refill on your cardiac medications before your next appointment, please call your pharmacy.  Follow-Up: Please follow up in 6 months with a NP/PA and Dr Radford Pax in 1 year.    Thank you for choosing [facility]!!

## 2019-01-18 DIAGNOSIS — H6123 Impacted cerumen, bilateral: Secondary | ICD-10-CM | POA: Diagnosis not present

## 2019-02-05 ENCOUNTER — Encounter: Payer: Self-pay | Admitting: *Deleted

## 2019-05-13 NOTE — Progress Notes (Addendum)
Cardiology Office Note  Date: 05/14/2019   ID: Dale Sanford May 13, 1950, MRN [id number]  PCP:  Dale Heron, MD  Cardiologist:  Dale Magic, MD Electrophysiologist:  None   Chief Complaint  Patient presents with  . Follow-up    Nonischemic cardiomyopathy, aortic valve disorder, essential hypertension, chronic combined systolic and diastolic congestive heart failure, ascending aortic aneurysm    History of Present Illness: Dale Sanford is a 69 y.o. 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 Other past medical history includes hypertension, GERD, dilated cardiomyopathy.  Echo 2017: EF 40 to 45%, diffuse hypokinesis, grade 2 DD, stable AV bioprosthesis, aortic root mildly dilated 39 mm  Stress Myoview 11/2015 for preop clearance = EF 44%, no ischemia  Echo 04/2018: EF 20 to 25%, MOD  MR, MOD  AR.  Entresto and spironolactone started.  Losartan discontinued.  LHC 04/2018; minimal luminal irregularities, no evidence obstructive coronary artery disease.  MOD.-SEV.  Reduced LVSF/EF 30-35%, global hypokinesis.  RHC 04/2018; mildly elevated filling pressures, mild pulmonary hypertension, mildly reduced CO, RA pressure: 7 mmHg, RV pressure 39/30 mmHg, PCWP 15 mmHg, PAP 39/11 mean 24 mmHg, CO 3.45/CI 2.16.  Nuclear Planar SPECT imaging 05/31/2018 assessing for transthyretin amyloid = equivocal study.  Cardiac MRI 09/2018: EF 40%, no evidence of cardiac amyloid/infarct/infiltrative process, dilated aortic root 40 mm.  He presents today with no particular complaints.  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  He takes care of his elderly mother.  He denies any anginal symptoms but does complain of mild dyspnea on moderate exertion which resolves quickly.  No lower extremity edema.   Denies any palpitations or arrhythmias, orthostatic symptoms/near syncopal/syncopal episodes, denies any bleeding in stools or urine.  Denies any claudication-like symptoms, or lower extremity edema.  He is tolerating the Entresto dosage.  Blood pressure slightly elevated on arrival today.  Recheck in left arm, 154/64.  Past Medical History:  Diagnosis Date  . 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 DCM (dilated cardiomyopathy) (HCC) 09/13/2017   EF 20-25% by echo with normal cors on cath.  Cardiac MRI with EF 40% 09/2018.  . Dilated aortic root (HCC) 09/13/2017   77mm by cardiac MRI 09/2018  . GERD (gastroesophageal reflux disease)   . Hypertension   . Poor dental hygiene     Past Surgical History:  Procedure Laterality Date  . DESCENDING AORTIC resesection, replacement  12/1999   Hemashield Graft  . 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 RIGHT/LEFT HEART CATH AND CORONARY ANGIOGRAPHY N/A 05/11/2018   Procedure: RIGHT/LEFT HEART CATH AND CORONARY ANGIOGRAPHY;  Surgeon: Dale Ouch, MD;  Location: [facility];  Service: Cardiovascular;  Laterality: N/A;  . TOTAL HIP ARTHROPLASTY Right 02/18/2016   Procedure: RIGHT TOTAL HIP ARTHROPLASTY ANTERIOR APPROACH;  Surgeon: Dale Frederic, MD;  Location: [facility];  Service: Orthopedics;  Laterality: Right;    Current Outpatient Medications  Medication Sig Dispense Refill  . aspirin EC 81 MG tablet Take 1 tablet (81 mg total) by mouth daily. 90 tablet 3  . metoprolol (TOPROL XL) 200 MG 24 hr tablet Take 1 tablet (200 mg  total) by mouth daily. 90 tablet 3  . sacubitril-valsartan (ENTRESTO) 49-51 MG Take 1 tablet by mouth 2 (two) times daily. 180 tablet 3   No current facility-administered medications for this visit.   Allergies:  Nyquil  multi-symptom [pseudoeph-doxylamine-dm-apap]   Social History: The patient  reports that he has never smoked. He has never used smokeless tobacco. He reports that he does not drink alcohol or use drugs.   Family History: The patient's family history includes CAD in his father; Cancer in his brother; Cirrhosis in his brother.   ROS:  Please see the history of present illness. Otherwise, complete review of systems is positive for none.  All other systems are reviewed and negative.   Physical Exam: VS:  Ht  (1.575 m)   BMI 25.42 kg/m , BMI Body mass index is 25.42 kg/m.  Wt Readings from Last 3 Encounters:  11/13/18 139 lb (63 kg)  09/12/18 133 lb 1.9 oz (60.4 kg)  07/23/18 138 lb (62.6 kg)    General: Patient appears comfortable at rest. Neck: Supple, no elevated JVP or carotid bruits, no thyromegaly. Lungs: Clear to auscultation, nonlabored breathing at rest. Cardiac: Regular rate and rhythm, no S3 or significant systolic murmur, no pericardial rub. Extremities: No pitting edema, distal pulses 2+. Skin: Warm and dry. Musculoskeletal: No kyphosis. Neuropsychiatric: Alert and oriented x3, affect grossly appropriate.  ECG:  An ECG dated 05/14/2019 was personally reviewed today and demonstrated:  Sinus rhythm with first-degree AV block, LAFB, repolarization abnormality rate of 68  Recent Labwork: 09/17/2018: BUN 33; Creatinine, Ser 1.23; Potassium 5.5; Sodium 139  No results found for: CHOL, TRIG, HDL, CHOLHDL, VLDL, LDLCALC, LDLDIRECT  Other Studies Reviewed Today:  Cardiac MR Morphology w/wo 10/04/2018 ADDENDUM REPORT: 10/04/2018  Referring physician requested visually estimated ejection fraction. EF and volume calculations inaccurate due to significant motion artifact. Visually estimated LVEF 40%. This finding has been communicated to the referring physician. FINDINGS: 1. Normal left ventricular size. Mildly reduced systolic function, global hypokinesis. Normal wall  thickness. There is no late gadolinium enhancement in the left ventricular myocardium. Normal T1 myocardial nulling kinetics, suggests against a diagnosis of cardiac amyloidosis. 2. Normal right ventricular size, thickness. Mildly reduced systolic function. There are no regional wall motion abnormalities. 3.  Moderate left atrial enlargement.  Mid right atrial enlargement. 4. Sinuses 36 x 37 x 37 mm in short axis of the aortic valve. In the sagittal oblique "candy cane aorta" view, aortic root measure approximately 40 mm in a transverse, non-double oblique measurement. Mid ascending aorta measures 30 mm in transverse measurement. Arch 26 mm, proximal descending aorta 24 mm, mid descending 23 mm. 5. Aortic and mitral regurgitation. Aortic regurgitation appears moderate. Mitral valve regurgitation is not well assessed. 6.  Normal pericardium.  No pericardial effusion. Extracardiac findings:  Probable hepatic cyst 26 mm. Compared to CT Abdomen pelvis report 04/28/2015, this is likely a stable finding.  IMPRESSION: 1. Normal T1 myocardial nulling kinetics, suggests against a diagnosis of cardiac amyloidosis.  2. There is no late gadolinium enhancement in the left ventricular myocardium. No evidence of infarction, infiltrative process, or cardiomyopathic process.  3. Mildly dilated aortic root. Measured in a transverse plane, the aortic root measures 40 mm. This measurement correlates with echocardiographic measurements.  Cardiac MR Angio Chest 09/28/2018 FINDINGS: 1. Normal left ventricular size. Mildly reduced systolic function, global hypokinesis. Normal wall thickness.  There is no late gadolinium enhancement in the left ventricular myocardium.  Normal T1 myocardial nulling kinetics, suggests against  a diagnosis of cardiac amyloidosis.  2. Normal right ventricular size, thickness. Mildly reduced systolic function. There are no regional wall motion  abnormalities.  3. Moderate left atrial enlargement.  Mid right atrial enlargement.  4. Sinuses 36 x 37 x 37 mm in short axis of the aortic valve. In the sagittal oblique "candy cane aorta" view, aortic root measure approximately 40 mm in a transverse, non-double oblique measurement. Mid ascending aorta measures 30 mm in transverse measurement. Arch 26 mm, proximal descending aorta 24 mm, mid descending 23 mm.  5. Aortic and mitral regurgitation. Aortic regurgitation appears moderate. Mitral valve regurgitation is not well assessed.  6.  Normal pericardium.  No pericardial effusion.  Extracardiac findings:  Probable hepatic cyst 26 mm. Compared to CT Abdomen pelvis report 04/28/2015, this is likely a stable finding.  IMPRESSION: 1. Normal T1 myocardial nulling kinetics, suggests against a diagnosis of cardiac amyloidosis.  2. There is no late gadolinium enhancement in the left ventricular myocardium. No evidence of infarction, infiltrative process, or cardiomyopathic process.  3. Mildly dilated aortic root. Measured in a transverse plane, the aortic root measures 40 mm. This measurement correlates with echocardiographic measurements.   Echocardiogram 09/24/2018 wo imaging enhancement IMPRESSIONS  1. The left ventricle has moderate-severely reduced systolic function,  with an ejection fraction of 30-35%. The cavity size was normal. Left  ventricular diastolic Doppler parameters are consistent with impaired  relaxation. Indeterminate filling  pressures.  2. The right ventricle has mildly reduced systolic function. The cavity  was normal. There is no increase in right ventricular wall thickness.  Right ventricular systolic pressure is mildly elevated with an estimated  pressure of 29.1 mmHg.  3. Left atrial size was moderately dilated.  4. Right atrial size was mildly dilated.  5. S/p Ross procedure with ascending aortic replacement with Hemashield.  6. A  native pulmonic valve valve is present in the aortic position.  Procedure Date: 2001.  7. Aortic valve regurgitation is moderate by color flow Doppler. No  stenosis of the aortic valve.  8. Pulmonary valve allograft is well seated, well functioning. Trivial  PR. Normal systolic velocity.  9. The mitral valve is degenerative. Mild thickening of the mitral valve  leaflet. Mitral valve regurgitation is mild to moderate by color flow  Doppler.  10. There is mild dilatation of the aortic root measuring 40 mm.  11. The inferior vena cava was normal in size with <50% respiratory  variability.  12. When compared to the prior study: 05/01/2018 - Ejection fraction has  improved slightly. No significant change in remainder of exam. Side by  side comparison of images performed.   FINDINGS  Left Ventricle: The left ventricle has moderate-severely reduced systolic  function, with an ejection fraction of 30-35%. The cavity size was normal.  There is no increase in left ventricular wall thickness. Left ventricular  diastolic Doppler parameters are consistent with impaired relaxation. Indeterminate filling pressures  Right Ventricle: The right ventricle has mildly reduced systolic function.  The cavity was normal. There is no increase in right ventricular wall  thickness. Right ventricular systolic pressure is mildly elevated with an  estimated pressure of 29.1 mmHg. Left Atrium: Left atrial size was moderately dilated.  Right Atrium: Right atrial size was mildly dilated. Right atrial pressure  is estimated at 10 mmHg.  Interatrial Septum: The interatrial septum was not well visualized.  Pericardium: There is no evidence of pericardial effusion. Mitral Valve: The mitral valve is degenerative in appearance. Mild  thickening of  the mitral valve leaflet. Mitral valve regurgitation is mild  to moderate by color flow Doppler.  Tricuspid Valve: The tricuspid valve is normal in structure. Tricuspid    valve regurgitation is mild by color flow Doppler.  Aortic Valve: The aortic valve has been repaired/replaced Aortic valve  regurgitation is moderate by color flow Doppler. There is No stenosis of  the aortic valve. A native pulmonic valve aortic valve prosthesis valve is  present in the aortic position.  Procedure Date: 2001.  Pulmonic Valve: The pulmonic valve was normal in structure. Pulmonic valve  regurgitation is not visualized by color flow Doppler.  Aorta: There is mild dilatation of the aortic root measuring 40 mm.  Venous: The inferior vena cava is normal in size with less than 50%  respiratory variability. Compared to previous exam: 05/01/2018 - Ejection fraction has improved  slightly. No significant change in remainder of exam. Side by side  comparison of images performed.    Plantar imaging SPECT to assess for cardiac amyloidosis May 31, 2018 Amyloid study Impression The study is equivocal. The study is equivocal for TTR amyloidosis (visual score of 1 or H/CL ratio 1-1.5).    LHC/RHC 05/11/2018 There is moderate to severe left ventricular systolic dysfunction.  1.  Minimal luminal irregularities with no evidence of obstructive coronary artery disease. 2.  Moderately to severely reduced LV systolic function with an EF of 30 to 35% with global hypokinesis. 3.  Right heart catheterization showed mildly elevated filling pressures, mild pulmonary hypertension and mildly reduced cardiac output.  RA pressure: 7 mmHg, RV pressure 39 over 3 mmHg, pulmonary capillary wedge pressure: 15 mmHg, PA pressure: 39/11 with a mean of 24 mmHg.  Cardiac output was 3.45 with an index of 2.16. Recommendations: The patient has nonischemic cardiomyopathy for which I recommend medical therapy.  Consider switching losartan to Entresto and adding spironolactone.   Echocardiogram 05/01/2018 IMPRESSIONS  1. The left ventricle has severely reduced systolic function of 20-94%.  The cavity size was  mildly increased. There is mild concentric left  ventricular hypertrophy. Left ventricular diastolic Doppler parameters are  consistent with pseudonormal Elevated  left ventricular end-diastolic pressure.  2. The right ventricle has normal systolic function. The cavity was  normal. There is no increase in right ventricular wall thickness. Right  ventricular systolic pressure is mildly elevated with an estimated  pressure of 36 mmHg.  3. Left atrial size was moderately dilated.  4. The mitral valve is degenerative. There is mild thickening. Mitral  valve regurgitation is moderate by color flow Doppler.  5. The tricuspid valve is normal in structure.  6. S/p Ross procedure with ascending aortic replacement with Hemashield.  7. Aortic valve regurgitation is moderate by color flow Doppler.  8. The pulmonic valve was normal in structure.  9. There is mild dilatation of the aortic root.  10. Right atrial size was mildly dilated.  FINDINGS  Left Ventricle: The left ventricle has severely reduced systolic function  of 70-96%. The cavity size was mildly increased. There is mild concentric  left ventricular hypertrophy. Left ventricular diastolic Doppler  parameters are consistent with  pseudonormal (grade II) Elevated left ventricular end-diastolic pressure  Right Ventricle: The right ventricle has normal systolic function. The  cavity was normal. There is no increase in right ventricular wall  thickness. Right ventricular systolic pressure is mildly elevated with an  estimated pressure of 28.0 mmHg.  Left Atrium: left atrial size was moderately dilated  Right Atrium: right atrial size was  mildly dilated  Interatrial Septum: No atrial level shunt detected by color flow Doppler.  Pericardium: There is no evidence of pericardial effusion.  Mitral Valve: The mitral valve is degenerative in appearance. There is  mild thickening. Mitral valve regurgitation is moderate by color flow    Doppler.  Tricuspid Valve: The tricuspid valve is normal in structure. Tricuspid  valve regurgitation is mild by color flow Doppler.  Aortic Valve: The aortic valve is tricuspid Aortic valve regurgitation is  moderate by color flow Doppler.  Pulmonic Valve: The pulmonic valve was normal in structure. Pulmonic valve  regurgitation is not visualized by color flow Doppler.  Aorta: There is mild dilatation of the aortic root measuring 40 mm.  Venous: The inferior vena cava is normal in size with greater than 50%  respiratory variability.  Additional Findings: Since the prior study on 09/29/2017 LVEF has decreased  from 40-45% to 20-25%.    Echocardiogram 09/29/2017 Study Conclusions  - Left ventricle: The cavity size was normal. Wall thickness was  increased in a pattern of moderate LVH. Systolic function was  mildly to moderately reduced. The estimated ejection fraction was  in the range of 40% to 45%. Diffuse hypokinesis. Hypokinesis of  the anteroseptal myocardium. Features are consistent with a  pseudonormal left ventricular filling pattern, with concomitant  abnormal relaxation and increased filling pressure (grade 2  diastolic dysfunction).  - Aortic valve: Trileaflet; normal thickness leaflets. Postsurgical  changes seen. No dehiscence. There was moderate regurgitation.  Regurgitation pressure half-time: 305 ms. Vena contracta 0.32 cm.  - Mitral valve: Mildly thickened leaflets . There was mild  regurgitation.  - Left atrium: The atrium was mildly to moderately dilated.  - Right ventricle: The cavity size was mildly dilated. Wall  thickness was normal. Systolic function was mildly reduced.  - Atrial septum: No defect or patent foramen ovale was identified.  - Tricuspid valve: There was mild regurgitation.  - Pulmonic valve: There was trivial regurgitation.  - Pulmonary arteries: Systolic pressure was at the upper limits of  normal. PA peak pressure: 31  mm Hg (S).   Impressions:   - No change to LV function, reduced at 40-45% with anteroseptal  hypokinesis. Grade II diastolic dysfunction. Aortic valve s/p  Ross now with moderate regurgitation by pressure half time and  vena contracta. Mild MR and TR.   -------------------------------------------------------------------  Study data: Comparison was made to the study of 12/01/2015. Study  status: Routine. Procedure: The patient reported no pain pre or  post test. Transthoracic echocardiography for left ventricular  function evaluation and for assessment of valvular function. Image  quality was adequate. Study completion: There were no  complications.     Transthoracic echocardiography. M-mode,  complete 2D, spectral Doppler, and color Doppler. Birthdate:  Patient birthdate: 04-28-1950. Age: Patient is 69 yr old. Sex:  Gender: male.  BMI: 25.6 kg/m^2. Blood pressure:   150/60  Patient status: Outpatient. Study date: Study date: 09/29/2017.  Study time: 10:44 AM. Location: [facility]   -------------------------------------------------------------------   -------------------------------------------------------------------  Left ventricle: The cavity size was normal. Wall thickness was  increased in a pattern of moderate LVH. Systolic function was  mildly to moderately reduced. The estimated ejection fraction was  in the range of 40% to 45%. Diffuse hypokinesis. Regional wall  motion abnormalities:  Hypokinesis of the anteroseptal myocardium.  Features are consistent with a pseudonormal left ventricular  filling pattern, with concomitant abnormal relaxation and increased  filling pressure (grade 2 diastolic dysfunction).  Lexiscan Myoview 12/01/2015   Nuclear stress EF: 44%.  There was no ST segment deviation noted during stress.  This is an intermediate risk study.  The left ventricular ejection fraction is moderately decreased  (30-44%).  Normal resting and stress perfusion. No ischemia or infarction EF 44% w diffuse hypokinesis.   Echocardiogram 12/01/2015 Study Conclusions   - Left ventricle: The cavity size was normal. Wall thickness was  normal. Systolic function was mildly to moderately reduced. The  estimated ejection fraction was in the range of 40% to 45%.  Diffuse hypokinesis. There is severe hypokinesis of the  anteroseptal myocardium. Features are consistent with a  pseudonormal left ventricular filling pattern, with concomitant  abnormal relaxation and increased filling pressure (grade 2  diastolic dysfunction). Doppler parameters are consistent with  high ventricular filling pressure.  - Aortic valve: A bioprosthesis was present. There was trivial  regurgitation.  - Aortic root: The aortic root was mildly dilated.  - Mitral valve: There was mild regurgitation.  - Left atrium: The atrium was mildly dilated.  Impressions:  - Anteroseptal hypokinesis with overall mild to moderately reduced  LV systolic function; grade 2 diastolic dysfunction with elevated  LV filling pressure; s/p AVR with mild AI; mild MR; mild LAE;  mild TR.   Assessment and Plan:  1. NICM (nonischemic cardiomyopathy) (HCC)   2. Aortic valve disorder   3. Essential hypertension   4. Chronic combined systolic and diastolic CHF (congestive heart failure) (HCC)   5. Descending aortic aneurysm (HCC)    1. NIDCM (nonischemic cardiomyopathy) (HCC)  Echo 04/2018.Since the prior study on 09/29/2017 LVEF has decreased  from 40-45% to 20-25%.  Continue beta-blocker therapy, ARNI   2. Aortic valve disorder Aortic valve with aortic insufficiency status post AVR with Ross procedure and ascending aortic resection and replacement with Hemashield graft 2001. Mildly dilated aortic root. Measured in a transverse plane, the aortic root measures 40 mm.  Reminded patient to make sure if he has any type of surgical  procedure he needs SBE prophylaxis.  Patient voices understanding.  3. Essential hypertension Blood pressure on arrival today 156/40.  Recheck in left arm 154/54. May need to add Amlodipine 5 mg in the future if increasing dose of Entresto does not lower BP adequately.  4. Chronic combined systolic and diastolic CHF (congestive heart failure) (HCC) After echocardiogram in February 2020 Entresto and Spironolactone were started and Losartan was discontinued d/t decrease in EF to 20-25% from 40-45%. Currently taking 49/51 mg Entresto po bid started on 11/13/2018. May need to optimize dosage based on renal function and BP. Spironolactone was d/c'd earlier 09/2018 due to increased K+. He is euvolemic today. Lungs are CTA all fields. No evidence of LE edema.  Increase Entresto to 97/103 mg p.o. twice daily.  Get  bmet in 2 weeks.  Advised patient to start checking his blood pressure at least 3 times a week.  If he notices dizziness or decrease in blood pressure to call the clinic. He could not tolerate aldosterone antagonist d/t increased K+ 8 [facility]  5. Descending aortic aneurysm (HCC)  Cardiac MR 10/04/2018 showed aortic root measure approximately 40 mm in a transverse, non-double oblique measurement. Mid ascending aorta measures 30 mm in transverse measurement. Arch 26 mm, proximal descending aorta 24 mm, mid descending 23 mm. Plan were to repeat an echo in 1 year. He has an active order in the system for repeat echo this year In July.   Medication Adjustments/Labs and Tests Ordered: Current  medicines are reviewed at length with the patient today.  Concerns regarding medicines are outlined above.   Disposition 6 months with Dr Mayford Knife.  Signed, Rennis Harding, NP 05/14/2019 8:53 AM    [facility]

## 2019-05-14 ENCOUNTER — Encounter: Payer: Self-pay | Admitting: Family Medicine

## 2019-05-14 ENCOUNTER — Ambulatory Visit: Payer: BLUE CROSS/BLUE SHIELD | Admitting: Family Medicine

## 2019-05-14 ENCOUNTER — Other Ambulatory Visit: Payer: Self-pay

## 2019-05-14 VITALS — BP 156/40 | HR 68 | Ht 62.0 in | Wt 139.1 lb

## 2019-05-14 DIAGNOSIS — I10 Essential (primary) hypertension: Secondary | ICD-10-CM | POA: Diagnosis not present

## 2019-05-14 DIAGNOSIS — I719 Aortic aneurysm of unspecified site, without rupture: Secondary | ICD-10-CM

## 2019-05-14 DIAGNOSIS — I428 Other cardiomyopathies: Secondary | ICD-10-CM | POA: Diagnosis not present

## 2019-05-14 DIAGNOSIS — I5042 Chronic combined systolic (congestive) and diastolic (congestive) heart failure: Secondary | ICD-10-CM | POA: Diagnosis not present

## 2019-05-14 DIAGNOSIS — I359 Nonrheumatic aortic valve disorder, unspecified: Secondary | ICD-10-CM | POA: Diagnosis not present

## 2019-05-14 MED ORDER — ENTRESTO 97-103 MG PO TABS: 1.0000 | ORAL_TABLET | Freq: Two times a day (BID) | ORAL | 6 refills | Status: DC

## 2019-05-14 NOTE — Patient Instructions (Addendum)
Medication Instructions:  Your physician has recommended you make the following change in your medication:  1.  INCREASE the Entresto to 97/103 taking 1 tablet twice a day  .  *If you need a refill on your cardiac medications before your next appointment, please call your pharmacy*  Lab Work: 05/28/2019:  COME TO THE OFFICE FOR A BMET  If you have labs (blood work) drawn today and your tests are completely normal, you will receive your results only by: Marland Kitchen MyChart Message (if you have MyChart) OR . A paper copy in the mail If you have any lab test that is abnormal or we need to change your treatment, we will call you to review the results.  Testing/Procedures: None ordered  Follow-Up: At [facility], you and your health needs are our priority.  As part of our continuing mission to provide you with exceptional heart care, we have created designated Provider Care Teams.  These Care Teams include your primary Cardiologist (physician) and Advanced Practice Providers (APPs -  Physician Assistants and Nurse Practitioners) who all work together to provide you with the care you need, when you need it.  Your next appointment:   6 month(s)  The format for your next appointment:   In Person  Provider:   You may see Armanda Magic, MD or one of the following Advanced Practice Providers on your designated Care Team:    Ronie Spies, PA-C  Jacolyn Reedy, PA-C   Other Instructions

## 2019-05-17 ENCOUNTER — Ambulatory Visit: Payer: BLUE CROSS/BLUE SHIELD | Admitting: Physician Assistant

## 2019-05-28 ENCOUNTER — Other Ambulatory Visit: Payer: Self-pay

## 2019-05-28 ENCOUNTER — Other Ambulatory Visit: Payer: BLUE CROSS/BLUE SHIELD

## 2019-05-28 DIAGNOSIS — I5042 Chronic combined systolic (congestive) and diastolic (congestive) heart failure: Secondary | ICD-10-CM

## 2019-05-28 DIAGNOSIS — I719 Aortic aneurysm of unspecified site, without rupture: Secondary | ICD-10-CM

## 2019-05-28 DIAGNOSIS — I359 Nonrheumatic aortic valve disorder, unspecified: Secondary | ICD-10-CM

## 2019-05-28 DIAGNOSIS — I428 Other cardiomyopathies: Secondary | ICD-10-CM

## 2019-05-28 DIAGNOSIS — I10 Essential (primary) hypertension: Secondary | ICD-10-CM

## 2019-05-29 LAB — BASIC METABOLIC PANEL
BUN/Creatinine Ratio: 10 (ref 10–24)
BUN: 11 mg/dL (ref 8–27)
CO2: 25 mmol/L (ref 20–29)
Calcium: 8.9 mg/dL (ref 8.6–10.2)
Chloride: 105 mmol/L (ref 96–106)
Creatinine, Ser: 1.1 mg/dL (ref 0.76–1.27)
GFR calc Af Amer: 78 mL/min/{1.73_m2} (ref >59)
GFR calc non Af Amer: 68 mL/min/{1.73_m2} (ref >59)
Glucose: 98 mg/dL (ref 65–99)
Potassium: 4 mmol/L (ref 3.5–5.2)
Sodium: 145 mmol/L — ABNORMAL HIGH (ref 134–144)

## 2019-10-14 ENCOUNTER — Telehealth: Payer: Self-pay

## 2019-10-14 ENCOUNTER — Ambulatory Visit (HOSPITAL_COMMUNITY): Payer: BLUE CROSS/BLUE SHIELD | Attending: Internal Medicine

## 2019-10-14 ENCOUNTER — Other Ambulatory Visit: Payer: Self-pay

## 2019-10-14 DIAGNOSIS — I77810 Thoracic aortic ectasia: Secondary | ICD-10-CM | POA: Insufficient documentation

## 2019-10-14 LAB — ECHOCARDIOGRAM COMPLETE
AR max vel: 1.73 cm2
AV Area VTI: 1.92 cm2
AV Area mean vel: 2 cm2
AV Mean grad: 5 mmHg
AV Peak grad: 13.1 mmHg
Ao pk vel: 1.81 m/s
Area-P 1/2: 2.95 cm2
P 1/2 time: 434 msec
S' Lateral: 3.9 cm

## 2019-10-14 NOTE — Telephone Encounter (Signed)
Patient walked in requesting to discuss medications because the price went up from $10 to $80 and he can not afford it anymore. Attempted to call patient to discuss further. Left patient a message to call back.

## 2019-10-15 NOTE — Telephone Encounter (Signed)
Patient called in and said he was at work and missed phone call from [place]. He stated he will be at work tomorrow and we can leave detailed message on his voice mail.

## 2019-10-15 NOTE — Telephone Encounter (Signed)
**Note De-Identified Ryland Smoots Obfuscation** No answer so I left a message on the pts VM asking him to call Larita Fife back at [facility] at [phone].

## 2019-10-16 NOTE — Telephone Encounter (Signed)
**Note De-Identified Annet Manukyan Obfuscation** Unsure of which medication the pt is referring to but after reviewing his med list I feel this is concerning his Entresto.  I called and left a very detailed message on the pts VM as requested explaining 3 options to lower the cost of his Entresto as follows:  1.  If he currently has commercial ins coverage for his medications (not medicare/medicaid) that we are leaving him a Entresto $10 co-pay card in the front office of Dr Malachy Mood office for him to pick up.  2. If he is in his "ins gap" or "donut hole" (I did recommend he contact his ins provider if he does not knowif he is or not) to contact [employer] pt asst foundation at [phone] to ask questions concerning their pt asst program, request that they mail him an application and that once he receives the application to complete his part and bring to the office to drop off and that we will take care of Dr Malachy Mood part and will fax to [employer].  3. To call the office at [phone] and ask to s/w Larita Fife or Dr Malachy Mood nurse to discuss switching to a less costing medication.  Also, I advised that if this is not concerning his Entresto to please call back to let us know.

## 2019-10-16 NOTE — Telephone Encounter (Signed)
Follow Up  Patient states he will be coming to pick up $10 copay card tomorrow after work.

## 2019-10-28 ENCOUNTER — Telehealth: Payer: Self-pay

## 2019-10-28 DIAGNOSIS — I428 Other cardiomyopathies: Secondary | ICD-10-CM

## 2019-10-28 DIAGNOSIS — I719 Aortic aneurysm of unspecified site, without rupture: Secondary | ICD-10-CM

## 2019-10-28 DIAGNOSIS — R9430 Abnormal result of cardiovascular function study, unspecified: Secondary | ICD-10-CM

## 2019-10-28 DIAGNOSIS — I359 Nonrheumatic aortic valve disorder, unspecified: Secondary | ICD-10-CM

## 2019-10-28 NOTE — Telephone Encounter (Signed)
-----   Message from Quintella Reichert, MD sent at 10/16/2019 11:23 AM EDT ----- Echo showed mildly reduced LVF with EF 45-50% with increased stiffness of heart muscle, mild LAE, mildly leaky MV.  S/P ROss procedure with normal functioning native PV in the aortic position with mildly leaky valve and normal PV bioprosthesis.  There is mild dilatation of the ascending aorta after replacement.  REpeat echo in 1 year. The leakiness of the MV and AV has improved compared to last echo and EF has improved as well.

## 2019-10-28 NOTE — Telephone Encounter (Signed)
The patient has been notified of the result and verbalized understanding.  All questions (if any) were answered. Theresia Majors, RN 10/28/2019 5:21 PM  Orders for echocardiogram have been placed.

## 2019-11-24 ENCOUNTER — Other Ambulatory Visit: Payer: Self-pay | Admitting: Family Medicine

## 2019-12-10 ENCOUNTER — Other Ambulatory Visit: Payer: Self-pay | Admitting: Cardiology

## 2020-01-17 ENCOUNTER — Ambulatory Visit (INDEPENDENT_AMBULATORY_CARE_PROVIDER_SITE_OTHER): Payer: BLUE CROSS/BLUE SHIELD | Admitting: Otolaryngology

## 2020-02-24 ENCOUNTER — Emergency Department (HOSPITAL_COMMUNITY)
Admission: EM | Admit: 2020-02-24 | Discharge: 2020-02-24 | Disposition: A | Discharge status: Home / Self Care | Payer: BLUE CROSS/BLUE SHIELD | Attending: Emergency Medicine | Admitting: Emergency Medicine

## 2020-02-24 ENCOUNTER — Encounter (HOSPITAL_COMMUNITY): Payer: Self-pay

## 2020-02-24 ENCOUNTER — Other Ambulatory Visit: Payer: Self-pay

## 2020-02-24 ENCOUNTER — Emergency Department (HOSPITAL_COMMUNITY): Payer: BLUE CROSS/BLUE SHIELD

## 2020-02-24 DIAGNOSIS — W228XXA Striking against or struck by other objects, initial encounter: Secondary | ICD-10-CM | POA: Diagnosis not present

## 2020-02-24 DIAGNOSIS — Y9389 Activity, other specified: Secondary | ICD-10-CM | POA: Insufficient documentation

## 2020-02-24 DIAGNOSIS — Y9269 Other specified industrial and construction area as the place of occurrence of the external cause: Secondary | ICD-10-CM | POA: Insufficient documentation

## 2020-02-24 DIAGNOSIS — Z79899 Other long term (current) drug therapy: Secondary | ICD-10-CM | POA: Diagnosis not present

## 2020-02-24 DIAGNOSIS — S0101XA Laceration without foreign body of scalp, initial encounter: Secondary | ICD-10-CM | POA: Diagnosis not present

## 2020-02-24 DIAGNOSIS — I110 Hypertensive heart disease with heart failure: Secondary | ICD-10-CM | POA: Diagnosis not present

## 2020-02-24 DIAGNOSIS — R4020 Unspecified coma: Secondary | ICD-10-CM | POA: Diagnosis not present

## 2020-02-24 DIAGNOSIS — Z7982 Long term (current) use of aspirin: Secondary | ICD-10-CM | POA: Diagnosis not present

## 2020-02-24 DIAGNOSIS — Z96641 Presence of right artificial hip joint: Secondary | ICD-10-CM | POA: Insufficient documentation

## 2020-02-24 DIAGNOSIS — R55 Syncope and collapse: Secondary | ICD-10-CM | POA: Diagnosis not present

## 2020-02-24 DIAGNOSIS — R0902 Hypoxemia: Secondary | ICD-10-CM | POA: Diagnosis not present

## 2020-02-24 DIAGNOSIS — I5042 Chronic combined systolic (congestive) and diastolic (congestive) heart failure: Secondary | ICD-10-CM | POA: Insufficient documentation

## 2020-02-24 DIAGNOSIS — S0990XA Unspecified injury of head, initial encounter: Secondary | ICD-10-CM | POA: Diagnosis not present

## 2020-02-24 LAB — CBC
HCT: 43.5 % (ref 39.0–52.0)
Hemoglobin: 15 g/dL (ref 13.0–17.0)
MCH: 29.5 pg (ref 26.0–34.0)
MCHC: 34.5 g/dL (ref 30.0–36.0)
MCV: 85.5 fL (ref 80.0–100.0)
Platelets: 130 10*3/uL — ABNORMAL LOW (ref 150–400)
RBC: 5.09 MIL/uL (ref 4.22–5.81)
RDW: 12.6 % (ref 11.5–15.5)
WBC: 5.4 10*3/uL (ref 4.0–10.5)
nRBC: 0 % (ref 0.0–0.2)

## 2020-02-24 LAB — BASIC METABOLIC PANEL
Anion gap: 9 (ref 5–15)
BUN: 17 mg/dL (ref 8–23)
CO2: 25 mmol/L (ref 22–32)
Calcium: 8.3 mg/dL — ABNORMAL LOW (ref 8.9–10.3)
Chloride: 103 mmol/L (ref 98–111)
Creatinine, Ser: 1.21 mg/dL (ref 0.61–1.24)
GFR, Estimated: >60 mL/min (ref >60)
Glucose, Bld: 132 mg/dL — ABNORMAL HIGH (ref 70–99)
Potassium: 4.7 mmol/L (ref 3.5–5.1)
Sodium: 137 mmol/L (ref 135–145)

## 2020-02-24 LAB — CBG MONITORING, ED: Glucose-Capillary: 143 mg/dL — ABNORMAL HIGH (ref 70–99)

## 2020-02-24 MED ORDER — LIDOCAINE-EPINEPHRINE 1 %-1:100000 IJ SOLN
10.0000 mL | Freq: Once | INTRAMUSCULAR | Status: AC
  Administered 2020-02-24: 10 mL
  Filled 2020-02-24: qty 1

## 2020-02-24 NOTE — ED Notes (Signed)
Dr Miller at bedside. 

## 2020-02-24 NOTE — ED Triage Notes (Signed)
Pt BIB [facility] while at work pt had a syncopal episode. Pt was standing at his work station when he feel backwards, hitting his head on concrete floor, unresponsive approx 30 seconds. Upon coming around pt immediately stood up on his own, a/o to his baseline, denies N/V, SOB or dizziness. Laceration to his posterior head, approx 4in, bleeding controlled, denies thinners.    BP 140/50 HR 70 RR 18 CBG 158 95% RA   18g LAC

## 2020-02-24 NOTE — Discharge Instructions (Signed)
Your testing shows no signs of bleeding around the brain, your blood work was unremarkable, please eat and drink, the staples will need to come out within 10 days, see your family doctor or urgent care, ER for severe worsening symptoms, Tylenol or ibuprofen for pain

## 2020-02-24 NOTE — ED Provider Notes (Signed)
[facility] EMERGENCY DEPARTMENT Provider Note   CSN: [id number] Arrival date & time: 02/24/20  9735     History Chief Complaint  Patient presents with  . Near Syncope    Dale Sanford is a 69 y.o. male.  HPI   This patient is a 69 year old male with a known history of a dilated cardiomyopathy, ejection fraction has been somewhere between 25 and 40%, he has a known dilated aortic root at about 4 cm, acid reflux hypertension and a prior endocarditis of the aortic valve.  The patient states he was in his usual state of health this morning when he had some type of near syncopal episode, he denies having any prodromal symptoms and in fact states that he was feeling well, he did not have anything to eat or drink this morning, he got to work at 7 AM.  The patient had some kind of fall, struck his head, has a laceration to the crown of the head just left of the midline and posterior to the top of the head.  Bleeding was controlled, a bandage was placed by paramedics who transported the patient to the hospital.  There is no nausea vomiting or diarrhea, no chest pain or palpitations or shortness of breath, he has had no complaints prior to this happening, he has no headache at this time and takes no anticoagulants.  He denies numbness or weakness of the legs.  He does not usually have falls.  Review of the medical record shows that he was last seen in the office in February by cardiology, has not had any visits with the doctor in 7 or 8 months.  Endorses being compliant with his medications.    Past Medical History:  Diagnosis Date  . 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 DCM (dilated cardiomyopathy) (HCC) 09/13/2017   EF 20-25% by echo with normal cors on cath.  Cardiac MRI with EF 40% 09/2018.  . Dilated aortic root  (HCC) 09/13/2017   94mm by cardiac MRI 09/2018  . GERD (gastroesophageal reflux disease)   . Hypertension   . Poor dental hygiene     Patient Active Problem List   Diagnosis Date Noted  . Chronic combined systolic and diastolic CHF (congestive heart failure) (HCC) 09/11/2018  . Acute on chronic systolic (congestive) heart failure (HCC)   . Dilated aortic root (HCC) 09/13/2017  . DCM (dilated cardiomyopathy) (HCC) 09/13/2017  . 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 Aortic valve disorder 09/03/2013  . 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 Essential hypertension 09/03/2013    Past Surgical History:  Procedure Laterality Date  . DESCENDING AORTIC resesection, replacement  12/1999   Hemashield Graft  . 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 RIGHT/LEFT HEART CATH AND CORONARY ANGIOGRAPHY N/A 05/11/2018   Procedure: RIGHT/LEFT HEART CATH AND CORONARY ANGIOGRAPHY;  Surgeon: Iran Ouch, MD;  Location: [facility] INVASIVE CV LAB;  Service: Cardiovascular;  Laterality: N/A;  . TOTAL HIP ARTHROPLASTY Right 02/18/2016   Procedure: RIGHT TOTAL HIP ARTHROPLASTY ANTERIOR APPROACH;  Surgeon: Samson Frederic, MD;  Location: [facility] ORS;  Service: Orthopedics;  Laterality: Right;       Family History  Problem Relation Age of Onset  . CAD Father        "hardening  of the arteries"  . Cirrhosis Brother   . Cancer Brother     Social History   Tobacco Use  . Smoking status: Never Smoker  . Smokeless tobacco: Never Used  Vaping Use  . Vaping Use: Never used  Substance Use Topics  . Alcohol use: No    Comment: quit in 2008  . Drug use: No    Home Medications Prior to Admission medications   Medication Sig Start Date End Date Taking? Authorizing Provider  aspirin EC 81 MG tablet Take 1 tablet (81 mg total) by mouth daily. 09/13/17   Quintella Reichert, MD  ENTRESTO 97-103 MG TAKE 1 TABLET BY MOUTH TWICE A DAY 11/26/19   Netta Neat., NP  metoprolol (TOPROL-XL) 200 MG 24 hr tablet TAKE 1 TABLET BY MOUTH EVERY DAY 12/11/19   Quintella Reichert, MD    Allergies    Nyquil multi-symptom [pseudoeph-doxylamine-dm-apap]  Review of Systems   Review of Systems  Constitutional: Negative for chills and fever.  HENT: Negative for sore throat.   Eyes: Negative for visual disturbance.  Respiratory: Negative for cough and shortness of breath.   Cardiovascular: Negative for chest pain.  Gastrointestinal: Negative for abdominal pain, diarrhea, nausea and vomiting.  Genitourinary: Negative for dysuria and frequency.  Musculoskeletal: Negative for back pain and neck pain.  Skin: Positive for wound. Negative for rash.  Neurological: Negative for weakness, numbness and headaches.  Hematological: Negative for adenopathy.  Psychiatric/Behavioral: Negative for behavioral problems.    Physical Exam Updated Vital Signs BP (!) 147/63   Pulse 65   Temp 99.2 F (37.3 C) (Oral)   Resp (!) 29   SpO2 95%   Physical Exam Vitals and nursing note reviewed.  Constitutional:      General: He is not in acute distress.    Appearance: He is well-developed.  HENT:     Head: Normocephalic.     Comments: Laceration to the top of the scalp approximately 5 cm in length    Ears:     Comments: Hearing aids in place    Nose: Nose normal.     Mouth/Throat:     Mouth: Mucous membranes are moist.     Pharynx: No oropharyngeal exudate.     Comments: Dentition intact, no malocclusion Eyes:     General: No scleral icterus.       Right eye: No discharge.        Left eye: No discharge.     Conjunctiva/sclera: Conjunctivae normal.     Pupils: Pupils are equal, round, and reactive to light.  Neck:     Thyroid: No thyromegaly.     Vascular: No JVD.  Cardiovascular:     Rate and Rhythm: Normal rate and regular rhythm.     Heart sounds: Murmur heard.  No friction rub. No gallop.      Comments: Soft murmur Pulmonary:     Effort: Pulmonary effort  is normal. No respiratory distress.     Breath sounds: Normal breath sounds. No wheezing or rales.  Abdominal:     General: Bowel sounds are normal. There is no distension.     Palpations: Abdomen is soft. There is no mass.     Tenderness: There is no abdominal tenderness.  Musculoskeletal:        General: No tenderness. Normal range of motion.     Cervical back: Normal range of motion and neck supple.     Comments: Bilateral arms and legs are supple  without deformity or tenderness. Normal range of motion of all the major joints, no asymmetry of the lower extremities, no edema  Lymphadenopathy:     Cervical: No cervical adenopathy.  Skin:    General: Skin is warm and dry.     Findings: No erythema or rash.     Comments: Laceration as noted above to the scalp  Neurological:     Mental Status: He is alert.     Coordination: Coordination normal.     Comments: Normal speech coordination and gait, moves all 4 extremities with equal strength, symmetrical, cranial nerves III through XII are normal, answers questions appropriately, no memory deficit  Psychiatric:        Behavior: Behavior normal.     ED Results / Procedures / Treatments   Labs (all labs ordered are listed, but only abnormal results are displayed) Labs Reviewed  CBC - Abnormal; Notable for the following components:      Result Value   Platelets 130 (*)    All other components within normal limits  BASIC METABOLIC PANEL - Abnormal; Notable for the following components:   Glucose, Bld 132 (*)    Calcium 8.3 (*)    All other components within normal limits  CBG MONITORING, ED - Abnormal; Notable for the following components:   Glucose-Capillary 143 (*)    All other components within normal limits    EKG EKG Interpretation  Date/Time:  Monday February 24 2020 08:54:31 EST Ventricular Rate:  71 PR Interval:    QRS Duration: 116 QT Interval:  461 QTC Calculation: 501 R Axis:   -18 Text Interpretation: Sinus  rhythm Atrial premature complex Nonspecific intraventricular conduction delay Anteroseptal infarct, old Abnormal T, consider ischemia, diffuse leads T wave abnormality are new since 2001, LVH present Confirmed by Eber Hong ([id number]) on 02/24/2020 9:26:32 AM   Radiology CT Head Wo Contrast  Result Date: 02/24/2020 CLINICAL DATA:  Head trauma, loss of consciousness EXAM: CT HEAD WITHOUT CONTRAST TECHNIQUE: Contiguous axial images were obtained from the base of the skull through the vertex without intravenous contrast. COMPARISON:  11/19/1999 MRI head report.  11/17/1999 head CT report. FINDINGS: Brain: No acute infarct or intracranial hemorrhage. No mass lesion. No midline shift, ventriculomegaly or extra-axial fluid collection. Mild cerebral atrophy with ex vacuo dilatation. Sequela of prior bilateral frontal, left parietal and right cerebellar insults. Vascular: No hyperdense vessel or unexpected calcification. Skull: Negative for fracture or focal lesion. Sinuses/Orbits: Normal orbits. Mild ethmoid and sphenoid sinus mucosal thickening. Clear mastoid air cells. Other: Left parietal scalp soft tissue swelling and skin staples. IMPRESSION: 1. No acute intracranial process.  Left parietal scalp laceration. 2. Remote bilateral frontal, left parietal and right cerebellar insults. 3. Mild sinus disease. Electronically Signed   By: Stana Bunting M.D.   On: 02/24/2020 09:25    Procedures .Marland KitchenLaceration Repair  Date/Time: 02/24/2020 8:58 AM Performed by: Eber Hong, MD Authorized by: Eber Hong, MD   Consent:    Consent obtained:  Verbal   Consent given by:  Patient   Risks discussed:  Infection, pain, need for additional repair, poor cosmetic result and poor wound healing   Alternatives discussed:  No treatment and delayed treatment Anesthesia (see MAR for exact dosages):    Anesthesia method:  Local infiltration   Local anesthetic:  Lidocaine 1% WITH epi Laceration details:    Location:   Scalp   Scalp location:  Occipital   Length (cm):  5   Depth (mm):  2 Repair type:  Repair type:  Simple Pre-procedure details:    Preparation:  Patient was prepped and draped in usual sterile fashion and imaging obtained to evaluate for foreign bodies Exploration:    Hemostasis achieved with:  Direct pressure   Wound exploration: wound explored through full range of motion and entire depth of wound probed and visualized     Wound extent: no fascia violation noted, no foreign bodies/material noted, no muscle damage noted, no nerve damage noted, no tendon damage noted, no underlying fracture noted and no vascular damage noted     Contaminated: no   Treatment:    Area cleansed with:  Betadine   Amount of cleaning:  Standard   Irrigation solution:  Sterile saline   Irrigation volume:  200   Irrigation method:  Syringe   Visualized foreign bodies/material removed: no   Skin repair:    Repair method:  Staples   Number of staples:  4 Approximation:    Approximation:  Close Post-procedure details:    Dressing:  Antibiotic ointment and sterile dressing   Patient tolerance of procedure:  Tolerated well, no immediate complications Comments:         (including critical care time)  Medications Ordered in ED Medications  lidocaine-EPINEPHrine (XYLOCAINE W/EPI) 1 %-1:100000 (with pres) injection 10 mL (10 mLs Other Given 02/24/20 6812)    ED Course  I have reviewed the triage vital signs and the nursing notes.  Pertinent labs & imaging results that were available during my care of the patient were reviewed by me and considered in my medical decision making (see chart for details).    MDM Rules/Calculators/A&P                          We will perform CT scan of the head it is not clear why he fell, he did suffer an injury to the head with a laceration will need to be primarily repaired.  We will update tetanus as needed, check an EKG and labs to see if there is any other cause of  the syncopal event.  It is not unusual for him to go without food or water this early in the morning so I doubt that this has something to do with it but will check a blood sugar.  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  There was no seizure activity.  The patient again states that at this time, 30 minutes after arrival to the ER he still has no headache, still has no other complaints, wants to eat or drink something.  Primary repair went smoothly, 4 staples  CT scan and labs are unremarkable, this patient is stable for discharge.  Given oral food and fluids prior to discharge  Final Clinical Impression(s) / ED Diagnoses Final diagnoses:  Syncope, unspecified syncope type  Scalp laceration, initial encounter    Rx / DC Orders ED Discharge Orders    None       Eber Hong, MD 02/24/20 1059

## 2020-02-28 DIAGNOSIS — U071 COVID-19: Secondary | ICD-10-CM | POA: Diagnosis not present

## 2020-02-28 DIAGNOSIS — Z20822 Contact with and (suspected) exposure to covid-19: Secondary | ICD-10-CM | POA: Diagnosis not present

## 2020-02-28 DIAGNOSIS — R059 Cough, unspecified: Secondary | ICD-10-CM | POA: Diagnosis not present

## 2020-03-01 ENCOUNTER — Telehealth (HOSPITAL_COMMUNITY): Payer: Self-pay | Admitting: Adult Health

## 2020-03-01 NOTE — Telephone Encounter (Signed)
Called and LMOM regarding monoclonal antibody treatment for COVID 19 given to those who are at risk for complications and/or hospitalization of the virus.  Patient meets criteria based on: age, HTN and CHF.  LMOM at home #, unable to leave message on cell #  Call back number given: [phone]  My chart message: unable to send  Lillard Anes, NP

## 2020-03-04 ENCOUNTER — Other Ambulatory Visit: Payer: Self-pay | Admitting: Cardiology

## 2020-03-04 ENCOUNTER — Emergency Department (HOSPITAL_COMMUNITY): Payer: BLUE CROSS/BLUE SHIELD

## 2020-03-04 ENCOUNTER — Other Ambulatory Visit: Payer: Self-pay

## 2020-03-04 ENCOUNTER — Inpatient Hospital Stay (HOSPITAL_COMMUNITY)
Admission: EM | Admit: 2020-03-04 | Discharge: 2020-03-09 | DRG: 177 | Disposition: A | Discharge status: Home / Self Care | Payer: BLUE CROSS/BLUE SHIELD | Attending: Family Medicine | Admitting: Family Medicine

## 2020-03-04 ENCOUNTER — Encounter (HOSPITAL_COMMUNITY): Payer: Self-pay | Admitting: *Deleted

## 2020-03-04 DIAGNOSIS — E876 Hypokalemia: Secondary | ICD-10-CM | POA: Diagnosis not present

## 2020-03-04 DIAGNOSIS — R739 Hyperglycemia, unspecified: Secondary | ICD-10-CM | POA: Diagnosis present

## 2020-03-04 DIAGNOSIS — I420 Dilated cardiomyopathy: Secondary | ICD-10-CM | POA: Diagnosis not present

## 2020-03-04 DIAGNOSIS — Z7982 Long term (current) use of aspirin: Secondary | ICD-10-CM

## 2020-03-04 DIAGNOSIS — I5042 Chronic combined systolic (congestive) and diastolic (congestive) heart failure: Secondary | ICD-10-CM | POA: Diagnosis present

## 2020-03-04 DIAGNOSIS — J1282 Pneumonia due to coronavirus disease 2019: Secondary | ICD-10-CM | POA: Diagnosis present

## 2020-03-04 DIAGNOSIS — Z8249 Family history of ischemic heart disease and other diseases of the circulatory system: Secondary | ICD-10-CM | POA: Diagnosis not present

## 2020-03-04 DIAGNOSIS — Z79899 Other long term (current) drug therapy: Secondary | ICD-10-CM

## 2020-03-04 DIAGNOSIS — J9601 Acute respiratory failure with hypoxia: Secondary | ICD-10-CM | POA: Diagnosis present

## 2020-03-04 DIAGNOSIS — T380X5A Adverse effect of glucocorticoids and synthetic analogues, initial encounter: Secondary | ICD-10-CM | POA: Diagnosis not present

## 2020-03-04 DIAGNOSIS — I959 Hypotension, unspecified: Secondary | ICD-10-CM | POA: Diagnosis not present

## 2020-03-04 DIAGNOSIS — I110 Hypertensive heart disease with heart failure: Secondary | ICD-10-CM | POA: Diagnosis not present

## 2020-03-04 DIAGNOSIS — K219 Gastro-esophageal reflux disease without esophagitis: Secondary | ICD-10-CM | POA: Diagnosis present

## 2020-03-04 DIAGNOSIS — U071 COVID-19: Secondary | ICD-10-CM | POA: Diagnosis not present

## 2020-03-04 DIAGNOSIS — R0902 Hypoxemia: Secondary | ICD-10-CM | POA: Diagnosis not present

## 2020-03-04 DIAGNOSIS — R059 Cough, unspecified: Secondary | ICD-10-CM | POA: Diagnosis not present

## 2020-03-04 DIAGNOSIS — Z888 Allergy status to other drugs, medicaments and biological substances status: Secondary | ICD-10-CM

## 2020-03-04 LAB — CBC WITH DIFFERENTIAL/PLATELET
Abs Immature Granulocytes: 0.09 10*3/uL — ABNORMAL HIGH (ref 0.00–0.07)
Basophils Absolute: 0 10*3/uL (ref 0.0–0.1)
Basophils Relative: 0 %
Eosinophils Absolute: 0.1 10*3/uL (ref 0.0–0.5)
Eosinophils Relative: 1 %
HCT: 39.4 % (ref 39.0–52.0)
Hemoglobin: 14.6 g/dL (ref 13.0–17.0)
Immature Granulocytes: 1 %
Lymphocytes Relative: 9 %
Lymphs Abs: 0.8 10*3/uL (ref 0.7–4.0)
MCH: 32.7 pg (ref 26.0–34.0)
MCHC: 37.1 g/dL — ABNORMAL HIGH (ref 30.0–36.0)
MCV: 88.3 fL (ref 80.0–100.0)
Monocytes Absolute: 0.4 10*3/uL (ref 0.1–1.0)
Monocytes Relative: 5 %
Neutro Abs: 7.5 10*3/uL (ref 1.7–7.7)
Neutrophils Relative %: 84 %
Platelets: 250 10*3/uL (ref 150–400)
RBC: 4.46 MIL/uL (ref 4.22–5.81)
RDW: 13.2 % (ref 11.5–15.5)
WBC: 8.9 10*3/uL (ref 4.0–10.5)
nRBC: 0 % (ref 0.0–0.2)

## 2020-03-04 LAB — FERRITIN: Ferritin: 1310 ng/mL — ABNORMAL HIGH (ref 24–336)

## 2020-03-04 LAB — COMPREHENSIVE METABOLIC PANEL
ALT: 92 U/L — ABNORMAL HIGH (ref 0–44)
AST: 50 U/L — ABNORMAL HIGH (ref 15–41)
Albumin: 2.8 g/dL — ABNORMAL LOW (ref 3.5–5.0)
Alkaline Phosphatase: 66 U/L (ref 38–126)
Anion gap: 11 (ref 5–15)
BUN: 29 mg/dL — ABNORMAL HIGH (ref 8–23)
CO2: 25 mmol/L (ref 22–32)
Calcium: 7.9 mg/dL — ABNORMAL LOW (ref 8.9–10.3)
Chloride: 103 mmol/L (ref 98–111)
Creatinine, Ser: 1.12 mg/dL (ref 0.61–1.24)
GFR, Estimated: >60 mL/min (ref >60)
Glucose, Bld: 116 mg/dL — ABNORMAL HIGH (ref 70–99)
Potassium: 3.7 mmol/L (ref 3.5–5.1)
Sodium: 139 mmol/L (ref 135–145)
Total Bilirubin: 1.2 mg/dL (ref 0.3–1.2)
Total Protein: 6.3 g/dL — ABNORMAL LOW (ref 6.5–8.1)

## 2020-03-04 LAB — CBC
HCT: 35.5 % — ABNORMAL LOW (ref 39.0–52.0)
Hemoglobin: 13 g/dL (ref 13.0–17.0)
MCH: 32.4 pg (ref 26.0–34.0)
MCHC: 36.6 g/dL — ABNORMAL HIGH (ref 30.0–36.0)
MCV: 88.5 fL (ref 80.0–100.0)
Platelets: 216 10*3/uL (ref 150–400)
RBC: 4.01 MIL/uL — ABNORMAL LOW (ref 4.22–5.81)
RDW: 13 % (ref 11.5–15.5)
WBC: 8 10*3/uL (ref 4.0–10.5)
nRBC: 0 % (ref 0.0–0.2)

## 2020-03-04 LAB — D-DIMER, QUANTITATIVE: D-Dimer, Quant: 1.07 ug/mL-FEU — ABNORMAL HIGH (ref 0.00–0.50)

## 2020-03-04 LAB — TROPONIN I (HIGH SENSITIVITY)
Troponin I (High Sensitivity): 18 ng/L — ABNORMAL HIGH (ref <18)
Troponin I (High Sensitivity): 16 ng/L (ref <18)

## 2020-03-04 LAB — CREATININE, SERUM
Creatinine, Ser: 0.98 mg/dL (ref 0.61–1.24)
GFR, Estimated: >60 mL/min (ref >60)

## 2020-03-04 LAB — LACTIC ACID, PLASMA
Lactic Acid, Venous: 1.7 mmol/L (ref 0.5–1.9)
Lactic Acid, Venous: 1.2 mmol/L (ref 0.5–1.9)

## 2020-03-04 LAB — RESP PANEL BY RT-PCR (FLU A&B, COVID) ARPGX2
Influenza A by PCR: NEGATIVE
Influenza B by PCR: NEGATIVE
SARS Coronavirus 2 by RT PCR: POSITIVE — AB

## 2020-03-04 LAB — FIBRINOGEN: Fibrinogen: >800 mg/dL — ABNORMAL HIGH (ref 210–475)

## 2020-03-04 LAB — LACTATE DEHYDROGENASE: LDH: 384 U/L — ABNORMAL HIGH (ref 98–192)

## 2020-03-04 LAB — PROCALCITONIN: Procalcitonin: 0.13 ng/mL

## 2020-03-04 LAB — C-REACTIVE PROTEIN: CRP: 11.1 mg/dL — ABNORMAL HIGH (ref <1.0)

## 2020-03-04 LAB — TRIGLYCERIDES: Triglycerides: 67 mg/dL (ref <150)

## 2020-03-04 LAB — BRAIN NATRIURETIC PEPTIDE: B Natriuretic Peptide: 86.7 pg/mL (ref 0.0–100.0)

## 2020-03-04 MED ORDER — SODIUM CHLORIDE 0.9 % IV SOLN
1000.0000 mL | INTRAVENOUS | Status: DC
  Administered 2020-03-04 – 2020-03-05 (×3): 1000 mL via INTRAVENOUS

## 2020-03-04 MED ORDER — ZINC SULFATE 220 (50 ZN) MG PO CAPS
220.0000 mg | ORAL_CAPSULE | Freq: Every day | ORAL | Status: DC
  Administered 2020-03-04 – 2020-03-09 (×6): 220 mg via ORAL
  Filled 2020-03-04 (×6): qty 1

## 2020-03-04 MED ORDER — ENOXAPARIN SODIUM 40 MG/0.4ML ~~LOC~~ SOLN
40.0000 mg | SUBCUTANEOUS | Status: DC
  Administered 2020-03-04 – 2020-03-08 (×5): 40 mg via SUBCUTANEOUS
  Filled 2020-03-04 (×5): qty 0.4

## 2020-03-04 MED ORDER — IPRATROPIUM-ALBUTEROL 20-100 MCG/ACT IN AERS
1.0000 | INHALATION_SPRAY | Freq: Four times a day (QID) | RESPIRATORY_TRACT | Status: DC
  Administered 2020-03-04 – 2020-03-09 (×18): 1 via RESPIRATORY_TRACT
  Filled 2020-03-04 (×2): qty 4

## 2020-03-04 MED ORDER — HYDROCOD POLST-CPM POLST ER 10-8 MG/5ML PO SUER: 5.0000 mL | Freq: Two times a day (BID) | ORAL | Status: DC | PRN

## 2020-03-04 MED ORDER — PREDNISONE 20 MG PO TABS: 50.0000 mg | ORAL_TABLET | Freq: Every day | ORAL | Status: DC

## 2020-03-04 MED ORDER — DEXAMETHASONE SODIUM PHOSPHATE 10 MG/ML IJ SOLN
10.0000 mg | Freq: Once | INTRAMUSCULAR | Status: AC
  Administered 2020-03-04: 10 mg via INTRAVENOUS
  Filled 2020-03-04: qty 1

## 2020-03-04 MED ORDER — SACUBITRIL-VALSARTAN 97-103 MG PO TABS
1.0000 | ORAL_TABLET | Freq: Two times a day (BID) | ORAL | Status: DC
  Administered 2020-03-04 – 2020-03-09 (×10): 1 via ORAL
  Filled 2020-03-04 (×10): qty 1

## 2020-03-04 MED ORDER — ASCORBIC ACID 500 MG PO TABS
500.0000 mg | ORAL_TABLET | Freq: Every day | ORAL | Status: DC
  Administered 2020-03-04 – 2020-03-09 (×6): 500 mg via ORAL
  Filled 2020-03-04 (×7): qty 1

## 2020-03-04 MED ORDER — ONDANSETRON HCL 4 MG/2ML IJ SOLN: 4.0000 mg | Freq: Four times a day (QID) | INTRAMUSCULAR | Status: DC | PRN

## 2020-03-04 MED ORDER — ACETAMINOPHEN 325 MG PO TABS
650.0000 mg | ORAL_TABLET | Freq: Once | ORAL | Status: AC
  Administered 2020-03-04: 650 mg via ORAL
  Filled 2020-03-04: qty 2

## 2020-03-04 MED ORDER — METHYLPREDNISOLONE SODIUM SUCC 125 MG IJ SOLR
1.0000 mg/kg | Freq: Two times a day (BID) | INTRAMUSCULAR | Status: DC
  Administered 2020-03-04: 50.625 mg via INTRAVENOUS
  Filled 2020-03-04: qty 2

## 2020-03-04 MED ORDER — ONDANSETRON HCL 4 MG PO TABS: 4.0000 mg | ORAL_TABLET | Freq: Four times a day (QID) | ORAL | Status: DC | PRN

## 2020-03-04 NOTE — ED Notes (Signed)
Attempted to call report to [facility], Charity fundraiser. Dekina stated "I will call you right back"

## 2020-03-04 NOTE — H&P (Signed)
History and Physical    Dale Sanford UYQ:[id number] DOB: 1950/06/09 DOA: 03/04/2020  PCP: Clayborn Heron, MD  Patient coming from: Home  Chief Complaint: Dyspnea  HPI: Dale Sanford is a 69 y.o. male with medical history significant of DCM. Presenting with shortness of breath. Patient is a poor historian. Hx from dtr. The patient has not been feeling well for a week. He has had fatigue and dyspnea during that time. He has an exposure to COVID from his mother. So his daughter took him to get tested, and he was positive as well. He decided to isolate at home. The dtr noticed that over the weekend his PO intake had worsened and he had N/V. They spoke with his PCP to try to get MAB infusion, but he was unable to get that treatment as his symptoms worsened. His dtr decided he needed to come to the ED. No other aggravating or alleviating factors. No other treatments noted.    ED Course: Found to be COVID+. CXR c/w COVID PNA. Started on 2L [place] and decadron. TRH called for admission.   Review of Systems:  Denies CP, palpitations, dizziness, lightheadedness. Review of systems is otherwise negative for all not mentioned in HPI.   PMHx Past Medical History:  Diagnosis Date  . 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 DCM (dilated cardiomyopathy) (HCC) 09/13/2017   EF 20-25% by echo with normal cors on cath.  Cardiac MRI with EF 40% 09/2018.  . Dilated aortic root (HCC) 09/13/2017   61mm by cardiac MRI 09/2018  . GERD (gastroesophageal reflux disease)   . Hypertension   . Poor dental hygiene     PSHx Past Surgical History:  Procedure Laterality Date  . DESCENDING AORTIC resesection, replacement  12/1999   Hemashield Graft  . 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 RIGHT/LEFT HEART CATH AND  CORONARY ANGIOGRAPHY N/A 05/11/2018   Procedure: RIGHT/LEFT HEART CATH AND CORONARY ANGIOGRAPHY;  Surgeon: Iran Ouch, MD;  Location: [facility];  Service: Cardiovascular;  Laterality: N/A;  . TOTAL HIP ARTHROPLASTY Right 02/18/2016   Procedure: RIGHT TOTAL HIP ARTHROPLASTY ANTERIOR APPROACH;  Surgeon: Samson Frederic, MD;  Location: [facility];  Service: Orthopedics;  Laterality: Right;    SocHx  reports that he has never smoked. He has never used smokeless tobacco. He reports that he does not drink alcohol and does not use drugs.  Allergies  Allergen Reactions  . Nyquil Multi-Symptom [Pseudoeph-Doxylamine-Dm-Apap] Itching and Rash    FamHx Family History  Problem Relation Age of Onset  . CAD Father        "hardening of the arteries"  . Cirrhosis Brother   . Cancer Brother     Prior to Admission medications   Medication Sig Start Date End Date Taking? Authorizing Provider  aspirin EC 81 MG tablet Take 1 tablet (81 mg total) by mouth daily. 09/13/17   Quintella Reichert, MD  ENTRESTO 97-103 MG TAKE 1 TABLET BY MOUTH TWICE A DAY 11/26/19   Netta Neat., NP  metoprolol (TOPROL-XL) 200 MG 24 hr tablet TAKE 1 TABLET BY MOUTH EVERY DAY 12/11/19   Quintella Reichert, MD    Physical Exam: Vitals:   03/04/20 1315 03/04/20 1330 03/04/20 1345 03/04/20 1400  BP: (!) 136/52 (!) 132/54 Marland Kitchen)  142/56 (!) 133/55  Pulse: 94 92 92 88  Resp: (!) 35 (!) 41 (!) 42 (!) 26  Temp:      TempSrc:      SpO2: 100% 100% 100% 100%  Weight:      Height:        General: 69 y.o. male resting in bed in NAD Eyes: PERRL, normal sclera ENMT: Nares patent w/o discharge, orophaynx clear, dentition normal, ears w/o discharge/lesions/ulcers Neck: Supple, trachea midline Cardiovascular: tachy, +S1, S2, no m/g/r, equal pulses throughout Respiratory: course sounds, no wheeze, increased WOB on 2L [place] GI: BS+, NDNT, no masses noted, no organomegaly noted MSK: No e/c/c Skin: No rashes, bruises, ulcerations  noted Neuro: A&O x 3, no focal deficits Psyc: Slow interaction and flat affect, calm/cooperative  Labs on Admission: I have personally reviewed following labs and imaging studies  CBC: Recent Labs  Lab 03/04/20 1245  WBC 8.9  NEUTROABS 7.5  HGB 14.6  HCT 39.4  MCV 88.3  PLT 250   Basic Metabolic Panel: Recent Labs  Lab 03/04/20 1245  NA 139  K 3.7  CL 103  CO2 25  GLUCOSE 116*  BUN 29*  CREATININE 1.12  CALCIUM 7.9*   GFR: Estimated Creatinine Clearance: 43.5 mL/min (by C-G formula based on SCr of 1.12 mg/dL). Liver Function Tests: Recent Labs  Lab 03/04/20 1245  AST 50*  ALT 92*  ALKPHOS 66  BILITOT 1.2  PROT 6.3*  ALBUMIN 2.8*   No results for input(s): LIPASE, AMYLASE in the last 168 hours. No results for input(s): AMMONIA in the last 168 hours. Coagulation Profile: No results for input(s): INR, PROTIME in the last 168 hours. Cardiac Enzymes: No results for input(s): CKTOTAL, CKMB, CKMBINDEX, TROPONINI in the last 168 hours. BNP (last 3 results) No results for input(s): PROBNP in the last 8760 hours. HbA1C: No results for input(s): HGBA1C in the last 72 hours. CBG: No results for input(s): GLUCAP in the last 168 hours. Lipid Profile: Recent Labs    03/04/20 1245  TRIG 67   Thyroid Function Tests: No results for input(s): TSH, T4TOTAL, FREET4, T3FREE, THYROIDAB in the last 72 hours. Anemia Panel: Recent Labs    03/04/20 1245  FERRITIN 1,310*   Urine analysis:    Component Value Date/Time   COLORURINE YELLOW 04/28/2015 0046   APPEARANCEUR CLEAR 04/28/2015 0046   LABSPEC 1.020 04/28/2015 0046   PHURINE 5.0 04/28/2015 0046   GLUCOSEU NEGATIVE 04/28/2015 0046   HGBUR NEGATIVE 04/28/2015 0046   BILIRUBINUR SMALL (A) 04/28/2015 0046   KETONESUR NEGATIVE 04/28/2015 0046   PROTEINUR NEGATIVE 04/28/2015 0046   NITRITE NEGATIVE 04/28/2015 0046   LEUKOCYTESUR NEGATIVE 04/28/2015 0046    Radiological Exams on Admission: DG Chest Port 1  View  Result Date: 03/04/2020 CLINICAL DATA:  Cough EXAM: PORTABLE CHEST 1 VIEW COMPARISON:  04/27/2015 and prior. FINDINGS: Hypoinflated lungs. No pneumothorax or pleural effusion. Postsurgical appearance of cardiomediastinal silhouette is unchanged. Patchy left predominant bilateral pulmonary opacities are new. No acute osseous abnormality. IMPRESSION: Bilateral pulmonary opacities, infection versus edema. Electronically Signed   By: Stana Bunting M.D.   On: 03/04/2020 13:17    EKG: Independently reviewed. Sinus tach, no st elevations  Assessment/Plan COVID PNA     - admit to inpatient, med-surg     - solumedrol, inhalers, anti-tussives, IS     - follow inflammatory markers     - wean O2 as able  Elevated LFTs     - check hepatitis panel  NICM/chronic  combined HF     - last cardiology note 05/14/19 has him on Entresto, metoprolol, ASA     - once med rec complete, resume his HF meds  DVT prophylaxis: lovenox  Code Status: FULL  Family Communication: spoke with dtr by phone  Consults called: None   Status is: Inpatient  Remains inpatient appropriate because:Inpatient level of care appropriate due to severity of illness   Dispo: The patient is from: Home              Anticipated d/c is to: Home              Anticipated d/c date is: > 3 days              Patient currently is not medically stable to d/c.  Teddy Spike DO [facility]  If 7PM-7AM, please contact night-coverage [email or web address]  03/04/2020, 2:36 PM

## 2020-03-04 NOTE — ED Provider Notes (Signed)
[facility]-EMERGENCY DEPT Provider Note   CSN: [id number] Arrival date & time: 03/04/20  1115     History No chief complaint on file.   Dale Sanford is a 69 y.o. male.  69 year old male diagnosed with Covid who scheduled to have a monoclonal antibody infusion presents with worsening shortness of breath.  He also has a significant history of CHF with a EF of 20%.  He denies any chest pain or chest pressure.  Has had some mild cough.  No reported fever.  Does not use oxygen all the time.  Was placed onto his boxing here.  Denies any vomiting or diarrhea.  No abdominal discomfort.        Past Medical History:  Diagnosis Date  . 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 DCM (dilated cardiomyopathy) (HCC) 09/13/2017   EF 20-25% by echo with normal cors on cath.  Cardiac MRI with EF 40% 09/2018.  . Dilated aortic root (HCC) 09/13/2017   8mm by cardiac MRI 09/2018  . GERD (gastroesophageal reflux disease)   . Hypertension   . Poor dental hygiene     Patient Active Problem List   Diagnosis Date Noted  . Chronic combined systolic and diastolic CHF (congestive heart failure) (HCC) 09/11/2018  . Acute on chronic systolic (congestive) heart failure (HCC)   . Dilated aortic root (HCC) 09/13/2017  . DCM (dilated cardiomyopathy) (HCC) 09/13/2017  . 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 Aortic valve disorder 09/03/2013  . 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 Essential hypertension 09/03/2013    Past Surgical History:  Procedure Laterality Date  . DESCENDING AORTIC resesection, replacement  12/1999   Hemashield Graft  . 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 RIGHT/LEFT HEART CATH AND CORONARY ANGIOGRAPHY N/A 05/11/2018   Procedure:  RIGHT/LEFT HEART CATH AND CORONARY ANGIOGRAPHY;  Surgeon: Iran Ouch, MD;  Location: [facility];  Service: Cardiovascular;  Laterality: N/A;  . TOTAL HIP ARTHROPLASTY Right 02/18/2016   Procedure: RIGHT TOTAL HIP ARTHROPLASTY ANTERIOR APPROACH;  Surgeon: Samson Frederic, MD;  Location: [facility];  Service: Orthopedics;  Laterality: Right;       Family History  Problem Relation Age of Onset  . CAD Father        "hardening of the arteries"  . Cirrhosis Brother   . Cancer Brother     Social History   Tobacco Use  . Smoking status: Never Smoker  . Smokeless tobacco: Never Used  Vaping Use  . Vaping Use: Never used  Substance Use Topics  . Alcohol use: No    Comment: quit in 2008  . Drug use: No    Home Medications Prior to Admission medications   Medication Sig Start Date End Date Taking? Authorizing Provider  aspirin EC 81 MG tablet Take 1 tablet (81 mg total) by mouth daily. 09/13/17   Quintella Reichert, MD  ENTRESTO 97-103 MG TAKE 1 TABLET BY MOUTH TWICE A DAY 11/26/19   Netta Neat., NP  metoprolol (TOPROL-XL) 200 MG 24 hr tablet TAKE 1 TABLET BY MOUTH EVERY DAY 12/11/19   Quintella Reichert, MD    Allergies    Nyquil multi-symptom [pseudoeph-doxylamine-dm-apap]  Review of Systems   Review of Systems  All other systems reviewed  and are negative.   Physical Exam Updated Vital Signs BP (!) 130/53   Pulse (!) 103   Temp 98.1 F (36.7 C) (Oral)   Resp (!) 37   Ht 1.549 m (5\' 1" )   Wt 50.8 kg   SpO2 100%   BMI 21.16 kg/m   Physical Exam Vitals and nursing note reviewed.  Constitutional:      General: He is not in acute distress.    Appearance: He is underweight and well-nourished. He is not toxic-appearing.  HENT:     Head: Normocephalic and atraumatic.  Eyes:     General: Lids are normal.     Extraocular Movements: EOM normal.     Conjunctiva/sclera: Conjunctivae normal.     Pupils: Pupils are equal, round, and reactive to light.  Neck:      Thyroid: No thyroid mass.     Trachea: No tracheal deviation.  Cardiovascular:     Rate and Rhythm: Normal rate and regular rhythm.     Heart sounds: Normal heart sounds. No murmur heard. No gallop.   Pulmonary:     Effort: Pulmonary effort is normal. No respiratory distress.     Breath sounds: No stridor. Decreased breath sounds present. No wheezing, rhonchi or rales.  Abdominal:     General: Bowel sounds are normal. There is no distension.     Palpations: Abdomen is soft.     Tenderness: There is no abdominal tenderness. There is no CVA tenderness or rebound.  Musculoskeletal:        General: No tenderness or edema. Normal range of motion.     Cervical back: Normal range of motion and neck supple.  Skin:    General: Skin is warm and dry.     Findings: No abrasion or rash.  Neurological:     Mental Status: He is alert and oriented to person, place, and time.     GCS: GCS eye subscore is 4. GCS verbal subscore is 5. GCS motor subscore is 6.     Cranial Nerves: No cranial nerve deficit.     Sensory: No sensory deficit.     Deep Tendon Reflexes: Strength normal.  Psychiatric:        Mood and Affect: Mood and affect normal.        Speech: Speech normal.        Behavior: Behavior normal.     ED Results / Procedures / Treatments   Labs (all labs ordered are listed, but only abnormal results are displayed) Labs Reviewed  CULTURE, BLOOD (ROUTINE X 2)  CULTURE, BLOOD (ROUTINE X 2)  RESP PANEL BY RT-PCR (FLU A&B, COVID) ARPGX2  RESP PANEL BY RT-PCR (FLU A&B, COVID) ARPGX2  LACTIC ACID, PLASMA  LACTIC ACID, PLASMA  CBC WITH DIFFERENTIAL/PLATELET  COMPREHENSIVE METABOLIC PANEL  D-DIMER, QUANTITATIVE (NOT AT [facility])  PROCALCITONIN  LACTATE DEHYDROGENASE  FERRITIN  TRIGLYCERIDES  FIBRINOGEN  C-REACTIVE PROTEIN  BRAIN NATRIURETIC PEPTIDE    EKG None  Radiology No results found.  Procedures Procedures (including critical care time)  Medications Ordered in ED Medications   0.9 %  sodium chloride infusion (has no administration in time range)    ED Course  I have reviewed the triage vital signs and the nursing notes.  Pertinent labs & imaging results that were available during my care of the patient were reviewed by me and considered in my medical decision making (see chart for details).    MDM Rules/Calculators/A&P  Chest x-ray consistent with Covid.  Covid test also positive here.  He is flu negative.  Has an oxygen requirement.  He is also febrile.  Treated with Tylenol for his fever.  Requires hospitalization  CRITICAL CARE Performed by: Toy Baker Total critical care time: 45 minutes Critical care time was exclusive of separately billable procedures and treating other patients. Critical care was necessary to treat or prevent imminent or life-threatening deterioration. Critical care was time spent personally by me on the following activities: development of treatment plan with patient and/or surrogate as well as nursing, discussions with consultants, evaluation of patient's response to treatment, examination of patient, obtaining history from patient or surrogate, ordering and performing treatments and interventions, ordering and review of laboratory studies, ordering and review of radiographic studies, pulse oximetry and re-evaluation of patient's condition.  Final Clinical Impression(s) / ED Diagnoses Final diagnoses:  None    Rx / DC Orders ED Discharge Orders    None       Lorre Nick, MD 03/04/20 1427

## 2020-03-04 NOTE — ED Triage Notes (Signed)
Pt reports feeling weak for a few days.   Pt having labored breathing but denies SOB.  Pt doesn't think he's been dx with COVID.  Pt found to be 86% RA in triage.  Pt put on 2L [place]. Pt a/o x 4.  Pt in a wheelchair but able to transfer from chair to chair.

## 2020-03-04 NOTE — ED Notes (Signed)
Called report to [place], Charity fundraiser

## 2020-03-05 ENCOUNTER — Other Ambulatory Visit: Payer: Self-pay

## 2020-03-05 LAB — COMPREHENSIVE METABOLIC PANEL
ALT: 67 U/L — ABNORMAL HIGH (ref 0–44)
AST: 28 U/L (ref 15–41)
Albumin: 2.2 g/dL — ABNORMAL LOW (ref 3.5–5.0)
Alkaline Phosphatase: 52 U/L (ref 38–126)
Anion gap: 12 (ref 5–15)
BUN: 29 mg/dL — ABNORMAL HIGH (ref 8–23)
CO2: 18 mmol/L — ABNORMAL LOW (ref 22–32)
Calcium: 7.4 mg/dL — ABNORMAL LOW (ref 8.9–10.3)
Chloride: 110 mmol/L (ref 98–111)
Creatinine, Ser: 0.89 mg/dL (ref 0.61–1.24)
GFR, Estimated: >60 mL/min (ref >60)
Glucose, Bld: 252 mg/dL — ABNORMAL HIGH (ref 70–99)
Potassium: 3.5 mmol/L (ref 3.5–5.1)
Sodium: 140 mmol/L (ref 135–145)
Total Bilirubin: 0.9 mg/dL (ref 0.3–1.2)
Total Protein: 5.5 g/dL — ABNORMAL LOW (ref 6.5–8.1)

## 2020-03-05 LAB — CBC WITH DIFFERENTIAL/PLATELET
Abs Immature Granulocytes: 0.03 10*3/uL (ref 0.00–0.07)
Basophils Absolute: 0 10*3/uL (ref 0.0–0.1)
Basophils Relative: 0 %
Eosinophils Absolute: 0 10*3/uL (ref 0.0–0.5)
Eosinophils Relative: 0 %
HCT: 39.7 % (ref 39.0–52.0)
Hemoglobin: 13.6 g/dL (ref 13.0–17.0)
Immature Granulocytes: 1 %
Lymphocytes Relative: 8 %
Lymphs Abs: 0.4 10*3/uL — ABNORMAL LOW (ref 0.7–4.0)
MCH: 30.4 pg (ref 26.0–34.0)
MCHC: 34.3 g/dL (ref 30.0–36.0)
MCV: 88.8 fL (ref 80.0–100.0)
Monocytes Absolute: 0.1 10*3/uL (ref 0.1–1.0)
Monocytes Relative: 2 %
Neutro Abs: 4.5 10*3/uL (ref 1.7–7.7)
Neutrophils Relative %: 89 %
Platelets: 222 10*3/uL (ref 150–400)
RBC: 4.47 MIL/uL (ref 4.22–5.81)
RDW: 12.4 % (ref 11.5–15.5)
WBC: 5.1 10*3/uL (ref 4.0–10.5)
nRBC: 0 % (ref 0.0–0.2)

## 2020-03-05 LAB — HEMOGLOBIN A1C
Hgb A1c MFr Bld: 5.8 % — ABNORMAL HIGH (ref 4.8–5.6)
Mean Plasma Glucose: 119.76 mg/dL

## 2020-03-05 LAB — HEPATITIS PANEL, ACUTE
HCV Ab: NONREACTIVE
Hep A IgM: NONREACTIVE
Hep B C IgM: NONREACTIVE
Hepatitis B Surface Ag: NONREACTIVE

## 2020-03-05 LAB — GLUCOSE, CAPILLARY
Glucose-Capillary: 188 mg/dL — ABNORMAL HIGH (ref 70–99)
Glucose-Capillary: 184 mg/dL — ABNORMAL HIGH (ref 70–99)
Glucose-Capillary: 242 mg/dL — ABNORMAL HIGH (ref 70–99)
Glucose-Capillary: 180 mg/dL — ABNORMAL HIGH (ref 70–99)

## 2020-03-05 LAB — PHOSPHORUS: Phosphorus: 3.6 mg/dL (ref 2.5–4.6)

## 2020-03-05 LAB — C-REACTIVE PROTEIN: CRP: 11.2 mg/dL — ABNORMAL HIGH (ref <1.0)

## 2020-03-05 LAB — D-DIMER, QUANTITATIVE: D-Dimer, Quant: 0.72 ug/mL-FEU — ABNORMAL HIGH (ref 0.00–0.50)

## 2020-03-05 LAB — FERRITIN: Ferritin: 1404 ng/mL — ABNORMAL HIGH (ref 24–336)

## 2020-03-05 LAB — MAGNESIUM: Magnesium: 1.9 mg/dL (ref 1.7–2.4)

## 2020-03-05 MED ORDER — SODIUM CHLORIDE 0.9 % IV SOLN
1000.0000 mL | INTRAVENOUS | Status: DC
  Administered 2020-03-05 – 2020-03-06 (×2): 1000 mL via INTRAVENOUS

## 2020-03-05 MED ORDER — METHYLPREDNISOLONE SODIUM SUCC 40 MG IJ SOLR: 40.0000 mg | Freq: Two times a day (BID) | INTRAMUSCULAR | Status: DC

## 2020-03-05 MED ORDER — INSULIN ASPART 100 UNIT/ML ~~LOC~~ SOLN
0.0000 [IU] | Freq: Three times a day (TID) | SUBCUTANEOUS | Status: DC
  Administered 2020-03-05: 3 [IU] via SUBCUTANEOUS
  Administered 2020-03-05 – 2020-03-06 (×2): 5 [IU] via SUBCUTANEOUS
  Administered 2020-03-06 – 2020-03-07 (×3): 3 [IU] via SUBCUTANEOUS
  Administered 2020-03-07: 5 [IU] via SUBCUTANEOUS
  Administered 2020-03-07: 2 [IU] via SUBCUTANEOUS
  Administered 2020-03-08: 5 [IU] via SUBCUTANEOUS
  Administered 2020-03-08: 8 [IU] via SUBCUTANEOUS

## 2020-03-05 MED ORDER — SODIUM CHLORIDE 0.9 % IV SOLN
100.0000 mg | Freq: Every day | INTRAVENOUS | Status: AC
  Administered 2020-03-06 – 2020-03-09 (×4): 100 mg via INTRAVENOUS
  Filled 2020-03-05 (×4): qty 20

## 2020-03-05 MED ORDER — SODIUM CHLORIDE 0.9 % IV SOLN
200.0000 mg | Freq: Once | INTRAVENOUS | Status: AC
  Administered 2020-03-05: 200 mg via INTRAVENOUS
  Filled 2020-03-05: qty 200

## 2020-03-05 MED ORDER — PANTOPRAZOLE SODIUM 40 MG PO TBEC
40.0000 mg | DELAYED_RELEASE_TABLET | Freq: Every day | ORAL | Status: DC
Start: 2020-03-05 — End: 2020-03-09
  Administered 2020-03-05 – 2020-03-09 (×5): 40 mg via ORAL
  Filled 2020-03-05 (×5): qty 1

## 2020-03-05 MED ORDER — INSULIN ASPART 100 UNIT/ML ~~LOC~~ SOLN: 0.0000 [IU] | Freq: Every day | SUBCUTANEOUS | Status: DC

## 2020-03-05 MED ORDER — METHYLPREDNISOLONE SODIUM SUCC 40 MG IJ SOLR
40.0000 mg | Freq: Three times a day (TID) | INTRAMUSCULAR | Status: DC
  Administered 2020-03-05 – 2020-03-09 (×12): 40 mg via INTRAVENOUS
  Filled 2020-03-05 (×12): qty 1

## 2020-03-05 NOTE — Progress Notes (Addendum)
PROGRESS NOTE                                                                             PROGRESS NOTE                                                                                                                                                                                                             Patient Demographics:    Dale Sanford, is a 69 y.o. male, DOB - 04/03/1950, XBJ:[id number]  Outpatient Primary MD for the patient is Rankins, Dale Dance, MD    LOS - 1  Admit date - 03/04/2020    No chief complaint on file.      Brief Narrative     Dale Sanford is a 69 y.o. male with medical history significant of DCM. Presenting with shortness of breath. Patient is a poor historian. Hx from dtr. The patient has not been feeling well for a week. He has had fatigue and dyspnea during that time. He has an exposure to COVID from his mother. So his daughter took him to get tested, and he was positive as well. He decided to isolate at home. The dtr noticed that over the weekend his PO intake had worsened and he had N/V. They spoke with his PCP to try to get MAB infusion, but he was unable to get that treatment as his symptoms worsened. His dtr decided he needed to come to the ED. No other aggravating or alleviating factors. No other treatments noted.    ED Course: Found to be COVID+. CXR c/w COVID PNA. Started on 2L [place] and decadron. TRH called for admission.     Subjective:    [name] today has, No headache, No chest pain, No abdominal pain , does report some dyspnea and shortness of breath.    Assessment  & Plan :    Active Problems:   COVID-19  Acute Hypoxic Resp. Failure due to Acute Covid 19 Viral Pneumonitis during the ongoing 2020 Covid 19 Pandemic -  -He  is unvaccinated. -He is on 5 L nasal cannula this morning,. -Continue with IV steroids, on IV solumedrol -given 5 L oxygen requirement he was started on  remdesivir. -Procalcitonin within normal range, no indication for antibiotics -To trend inflammatory markers. - Encouraged the patient to sit up in chair in the daytime use I-S and flutter valve for pulmonary toiletry and then prone in bed when at night.  Will advance activity and titrate down oxygen as possible. - Actemra/Baricitinib  off label use - patient was told that if COVID-19 pneumonitis gets worse we might potentially use Actemra off label, patient denies any known history of active diverticulitis, tuberculosis or hepatitis, understands the risks and benefits and wants to proceed with Actemra treatment if required.     SpO2: 92 % O2 Flow Rate (L/min): 5 L/min  Recent Labs  Lab 03/04/20 1241 03/04/20 1245 03/04/20 1248 03/04/20 1427 03/04/20 1840 03/05/20 0409  WBC  --  8.9  --   --  8.0 5.1  PLT  --  250  --   --  216 222  CRP  --  11.1*  --   --   --  11.2*  BNP  --  86.7  --   --   --   --   DDIMER  --  1.07*  --   --   --  0.72*  PROCALCITON  --  0.13  --   --   --   --   AST  --  50*  --   --   --  28  ALT  --  92*  --   --   --  67*  ALKPHOS  --  66  --   --   --  52  BILITOT  --  1.2  --   --   --  0.9  ALBUMIN  --  2.8*  --   --   --  2.2*  LATICACIDVEN 1.7  --   --  1.2  --   --   SARSCOV2NAA  --   --  POSITIVE*  --   --   --        ABG     Component Value Date/Time   PHART 7.347 (L) 05/11/2018 0755   PCO2ART 38.0 05/11/2018 0755   PO2ART 73.0 (L) 05/11/2018 0755   HCO3 20.9 05/11/2018 0755   TCO2 22 05/11/2018 0755   ACIDBASEDEF 4.0 (H) 05/11/2018 0755   O2SAT 94.0 05/11/2018 0755      Elevated LFTs -Due to Covid, monitor closely, negative hepatitis panel.  hyperglycemia -We will start on insulin sliding scale, follow on A1c  NICM/chronic combined HF     - last cardiology note 05/14/19 has him on Entresto, metoprolol, ASA     - once med rec complete, resume his HF meds     Condition - Extremely Guarded  Family Communication  :  D/W  daughter by phone.  Code Status :  Full  Consults  :  none  Disposition Plan  :    Status is: Inpatient  Not inpatient appropriate, will call [facility] team and downgrade to OBS.   Dispo: The patient is from: Home              Anticipated d/c is to: Home              Anticipated d/c date is: > 3 days              Patient currently  is not medically stable to d/c.      DVT Prophylaxis  :  Lovenox   Lab Results  Component Value Date   PLT 222 03/05/2020    Diet :  Diet Order            Diet Heart Room service appropriate? Yes; Fluid consistency: Thin  Diet effective now                  Inpatient Medications  Scheduled Meds: . vitamin C  500 mg Oral Daily  . enoxaparin (LOVENOX) injection  40 mg Subcutaneous Q24H  . insulin aspart  0-15 Units Subcutaneous TID WC  . insulin aspart  0-5 Units Subcutaneous QHS  . Ipratropium-Albuterol  1 puff Inhalation Q6H  . methylPREDNISolone (SOLU-MEDROL) injection  40 mg Intravenous Q8H  . pantoprazole  40 mg Oral Daily  . sacubitril-valsartan  1 tablet Oral BID  . zinc sulfate  220 mg Oral Daily   Continuous Infusions: . sodium chloride 1,000 mL (03/05/20 0355)  . [START ON 03/06/2020] remdesivir 100 mg in NS 100 mL     PRN Meds:.chlorpheniramine-HYDROcodone, ondansetron **OR** ondansetron (ZOFRAN) IV  Antibiotics  :    Anti-infectives (From admission, onward)   Start     Dose/Rate Route Frequency Ordered Stop   03/06/20 1000  remdesivir 100 mg in sodium chloride 0.9 % 100 mL IVPB        100 mg 200 mL/hr over 30 Minutes Intravenous Daily 03/05/20 0729 03/10/20 0959   03/05/20 1000  remdesivir 200 mg in sodium chloride 0.9% 250 mL IVPB        200 mg 580 mL/hr over 30 Minutes Intravenous Once 03/05/20 0729 03/05/20 1056       Laconda Basich M.D on 03/05/2020 at 2:08 PM  To page go to [email or web address]   [facility] -  Office  [phone]       Objective:   Vitals:   03/05/20 0010 03/05/20 0400 03/05/20  0416 03/05/20 1200  BP: 128/60  140/65   Pulse: 83  92   Resp: 18  20   Temp: 97.6 F (36.4 C)  97.7 F (36.5 C)   TempSrc:      SpO2: 94% 93% (!) 88% 92%  Weight:      Height:        Wt Readings from Last 3 Encounters:  03/04/20 50.8 kg  05/14/19 63.1 kg  11/13/18 63 kg     Intake/Output Summary (Last 24 hours) at 03/05/2020 1408 Last data filed at 03/05/2020 1259 Gross per 24 hour  Intake 1844.04 ml  Output 200 ml  Net 1644.04 ml     Physical Exam  Awake Alert, No new F.N deficits, Normal affect Symmetrical Chest wall movement, Good air movement bilaterally, CTAB RRR,No Gallops,Rubs or new Murmurs, No Parasternal Heave +ve B.Sounds, Abd Soft, No tenderness,  No rebound - guarding or rigidity. No Cyanosis, Clubbing or edema, No new Rash or bruise     Data Review:    CBC Recent Labs  Lab 03/04/20 1245 03/04/20 1840 03/05/20 0409  WBC 8.9 8.0 5.1  HGB 14.6 13.0 13.6  HCT 39.4 35.5* 39.7  PLT 250 216 222  MCV 88.3 88.5 88.8  MCH 32.7 32.4 30.4  MCHC 37.1* 36.6* 34.3  RDW 13.2 13.0 12.4  LYMPHSABS 0.8  --  0.4*  MONOABS 0.4  --  0.1  EOSABS 0.1  --  0.0  BASOSABS 0.0  --  0.0    Recent Labs  Lab 03/04/20 1241 03/04/20 1245 03/04/20 1427 03/04/20 1840 03/05/20 0409  NA  --  139  --   --  140  K  --  3.7  --   --  3.5  CL  --  103  --   --  110  CO2  --  25  --   --  18*  GLUCOSE  --  116*  --   --  252*  BUN  --  29*  --   --  29*  CREATININE  --  1.12  --  0.98 0.89  CALCIUM  --  7.9*  --   --  7.4*  AST  --  50*  --   --  28  ALT  --  92*  --   --  67*  ALKPHOS  --  66  --   --  52  BILITOT  --  1.2  --   --  0.9  ALBUMIN  --  2.8*  --   --  2.2*  MG  --   --   --   --  1.9  CRP  --  11.1*  --   --  11.2*  DDIMER  --  1.07*  --   --  0.72*  PROCALCITON  --  0.13  --   --   --   LATICACIDVEN 1.7  --  1.2  --   --   HGBA1C  --   --   --   --  5.8*  BNP  --  86.7  --   --   --      ------------------------------------------------------------------------------------------------------------------ Recent Labs    03/04/20 1245  TRIG 67    Lab Results  Component Value Date   HGBA1C 5.8 (H) 03/05/2020   ------------------------------------------------------------------------------------------------------------------ No results for input(s): TSH, T4TOTAL, T3FREE, THYROIDAB in the last 72 hours.  Invalid input(s): FREET3  Cardiac Enzymes No results for input(s): CKMB, TROPONINI, MYOGLOBIN in the last 168 hours.  Invalid input(s): CK ------------------------------------------------------------------------------------------------------------------    Component Value Date/Time   BNP 86.7 03/04/2020 1245    Micro Results Recent Results (from the past 240 hour(s))  Resp Panel by RT-PCR (Flu A&B, Covid) Nasopharyngeal Swab     Status: Abnormal   Collection Time: 03/04/20 12:48 PM   Specimen: Nasopharyngeal Swab; Nasopharyngeal(NP) swabs in vial transport medium  Result Value Ref Range Status   SARS Coronavirus 2 by RT PCR POSITIVE (A) NEGATIVE Final    Comment: RESULT CALLED TO, READ BACK BY AND VERIFIED WITH: SAVOIE,B. RN @1421  03/04/20 BILLINGSLEY,L (NOTE) SARS-CoV-2 target nucleic acids are DETECTED.  The SARS-CoV-2 RNA is generally detectable in upper respiratory specimens during the acute phase of infection. Positive results are indicative of the presence of the identified virus, but do not rule out bacterial infection or co-infection with other pathogens not detected by the test. Clinical correlation with patient history and other diagnostic information is necessary to determine patient infection status. The expected result is Negative.  Fact Sheet for Patients: 03/06/20  Fact Sheet for Healthcare Providers: BloggerCourse.com  This test is not yet approved or cleared by the [employer]  FDA and  has been authorized for detection and/or diagnosis of SARS-CoV-2 by FDA under an Emergency Use Authorization (EUA).  This EUA will remain in effect (meaning this tes t can be used) for the duration of  the COVID-19 declaration under Section 564(b)(1) of the Act, 21 U.S.C. section 360bbb-3(b)(1), unless the authorization is terminated or revoked  sooner.     Influenza A by PCR NEGATIVE NEGATIVE Final   Influenza B by PCR NEGATIVE NEGATIVE Final    Comment: (NOTE) The Xpert Xpress SARS-CoV-2/FLU/RSV plus assay is intended as an aid in the diagnosis of influenza from Nasopharyngeal swab specimens and should not be used as a sole basis for treatment. Nasal washings and aspirates are unacceptable for Xpert Xpress SARS-CoV-2/FLU/RSV testing.  Fact Sheet for Patients: BloggerCourse.com  Fact Sheet for Healthcare Providers: [employer]  This test is not yet approved or cleared by the [place] FDA and has been authorized for detection and/or diagnosis of SARS-CoV-2 by FDA under an Emergency Use Authorization (EUA). This EUA will remain in effect (meaning this test can be used) for the duration of the COVID-19 declaration under Section 564(b)(1) of the Act, 21 U.S.C. section 360bbb-3(b)(1), unless the authorization is terminated or revoked.  Performed at [facility], [address], [place], [place] [address]     Radiology Reports CT Head Wo Contrast  Result Date: 02/24/2020 CLINICAL DATA:  Head trauma, loss of consciousness EXAM: CT HEAD WITHOUT CONTRAST TECHNIQUE: Contiguous axial images were obtained from the base of the skull through the vertex without intravenous contrast. COMPARISON:  11/19/1999 MRI head report.  11/17/1999 head CT report. FINDINGS: Brain: No acute infarct or intracranial hemorrhage. No mass lesion. No midline shift, ventriculomegaly or extra-axial fluid collection. Mild  cerebral atrophy with ex vacuo dilatation. Sequela of prior bilateral frontal, left parietal and right cerebellar insults. Vascular: No hyperdense vessel or unexpected calcification. Skull: Negative for fracture or focal lesion. Sinuses/Orbits: Normal orbits. Mild ethmoid and sphenoid sinus mucosal thickening. Clear mastoid air cells. Other: Left parietal scalp soft tissue swelling and skin staples. IMPRESSION: 1. No acute intracranial process.  Left parietal scalp laceration. 2. Remote bilateral frontal, left parietal and right cerebellar insults. 3. Mild sinus disease. Electronically Signed   By: Stana Bunting M.D.   On: 02/24/2020 09:25   DG Chest Port 1 View  Result Date: 03/04/2020 CLINICAL DATA:  Cough EXAM: PORTABLE CHEST 1 VIEW COMPARISON:  04/27/2015 and prior. FINDINGS: Hypoinflated lungs. No pneumothorax or pleural effusion. Postsurgical appearance of cardiomediastinal silhouette is unchanged. Patchy left predominant bilateral pulmonary opacities are new. No acute osseous abnormality. IMPRESSION: Bilateral pulmonary opacities, infection versus edema. Electronically Signed   By: Stana Bunting M.D.   On: 03/04/2020 13:17

## 2020-03-06 LAB — COMPREHENSIVE METABOLIC PANEL
ALT: 48 U/L — ABNORMAL HIGH (ref 0–44)
AST: 20 U/L (ref 15–41)
Albumin: 2.1 g/dL — ABNORMAL LOW (ref 3.5–5.0)
Alkaline Phosphatase: 49 U/L (ref 38–126)
Anion gap: 11 (ref 5–15)
BUN: 23 mg/dL (ref 8–23)
CO2: 20 mmol/L — ABNORMAL LOW (ref 22–32)
Calcium: 7.3 mg/dL — ABNORMAL LOW (ref 8.9–10.3)
Chloride: 111 mmol/L (ref 98–111)
Creatinine, Ser: 0.72 mg/dL (ref 0.61–1.24)
GFR, Estimated: >60 mL/min (ref >60)
Glucose, Bld: 183 mg/dL — ABNORMAL HIGH (ref 70–99)
Potassium: 3.9 mmol/L (ref 3.5–5.1)
Sodium: 142 mmol/L (ref 135–145)
Total Bilirubin: 0.5 mg/dL (ref 0.3–1.2)
Total Protein: 5 g/dL — ABNORMAL LOW (ref 6.5–8.1)

## 2020-03-06 LAB — GLUCOSE, CAPILLARY
Glucose-Capillary: 159 mg/dL — ABNORMAL HIGH (ref 70–99)
Glucose-Capillary: 233 mg/dL — ABNORMAL HIGH (ref 70–99)
Glucose-Capillary: 173 mg/dL — ABNORMAL HIGH (ref 70–99)
Glucose-Capillary: 126 mg/dL — ABNORMAL HIGH (ref 70–99)

## 2020-03-06 LAB — PHOSPHORUS: Phosphorus: 2.2 mg/dL — ABNORMAL LOW (ref 2.5–4.6)

## 2020-03-06 LAB — FERRITIN: Ferritin: 898 ng/mL — ABNORMAL HIGH (ref 24–336)

## 2020-03-06 LAB — CBC WITH DIFFERENTIAL/PLATELET
Abs Immature Granulocytes: 0.18 10*3/uL — ABNORMAL HIGH (ref 0.00–0.07)
Basophils Absolute: 0 10*3/uL (ref 0.0–0.1)
Basophils Relative: 0 %
Eosinophils Absolute: 0 10*3/uL (ref 0.0–0.5)
Eosinophils Relative: 0 %
HCT: 36 % — ABNORMAL LOW (ref 39.0–52.0)
Hemoglobin: 12.4 g/dL — ABNORMAL LOW (ref 13.0–17.0)
Immature Granulocytes: 2 %
Lymphocytes Relative: 5 %
Lymphs Abs: 0.6 10*3/uL — ABNORMAL LOW (ref 0.7–4.0)
MCH: 30 pg (ref 26.0–34.0)
MCHC: 34.4 g/dL (ref 30.0–36.0)
MCV: 87.2 fL (ref 80.0–100.0)
Monocytes Absolute: 0.4 10*3/uL (ref 0.1–1.0)
Monocytes Relative: 3 %
Neutro Abs: 10.9 10*3/uL — ABNORMAL HIGH (ref 1.7–7.7)
Neutrophils Relative %: 90 %
Platelets: 260 10*3/uL (ref 150–400)
RBC: 4.13 MIL/uL — ABNORMAL LOW (ref 4.22–5.81)
RDW: 12.2 % (ref 11.5–15.5)
WBC: 12.1 10*3/uL — ABNORMAL HIGH (ref 4.0–10.5)
nRBC: 0 % (ref 0.0–0.2)

## 2020-03-06 LAB — C-REACTIVE PROTEIN: CRP: 5.8 mg/dL — ABNORMAL HIGH (ref <1.0)

## 2020-03-06 LAB — MAGNESIUM: Magnesium: 1.7 mg/dL (ref 1.7–2.4)

## 2020-03-06 LAB — D-DIMER, QUANTITATIVE: D-Dimer, Quant: 0.46 ug/mL-FEU (ref 0.00–0.50)

## 2020-03-06 MED ORDER — METOPROLOL SUCCINATE ER 50 MG PO TB24
50.0000 mg | ORAL_TABLET | Freq: Every day | ORAL | Status: DC
  Administered 2020-03-06 – 2020-03-09 (×4): 50 mg via ORAL
  Filled 2020-03-06 (×4): qty 1

## 2020-03-06 MED ORDER — BARICITINIB 2 MG PO TABS
4.0000 mg | ORAL_TABLET | Freq: Every day | ORAL | Status: DC
  Administered 2020-03-06 – 2020-03-09 (×4): 4 mg via ORAL
  Filled 2020-03-06 (×4): qty 2

## 2020-03-06 MED ORDER — K PHOS MONO-SOD PHOS DI & MONO 155-852-130 MG PO TABS
250.0000 mg | ORAL_TABLET | Freq: Three times a day (TID) | ORAL | Status: AC
  Administered 2020-03-06 – 2020-03-07 (×6): 250 mg via ORAL
  Filled 2020-03-06 (×6): qty 1

## 2020-03-06 NOTE — Progress Notes (Signed)
PROGRESS NOTE                                                                             PROGRESS NOTE                                                                                                                                                                                                             Patient Demographics:    Dale Sanford, is a 69 y.o. male, DOB - [birth year], QMV:[id number]  Outpatient Primary MD for the patient is Rankins, Fanny Dance, MD    LOS - 2  Admit date - 03/04/2020    No chief complaint on file.      Brief Narrative     Dale Sanford is a 69 y.o. male with medical history significant of DCM. Presenting with shortness of breath. Patient is a poor historian. Hx from dtr. The patient has not been feeling well for a week. He has had fatigue and dyspnea during that time. He has an exposure to COVID from his mother. So his daughter took him to get tested, and he was positive as well. He decided to isolate at home. The dtr noticed that over the weekend his PO intake had worsened and he had N/V. They spoke with his PCP to try to get MAB infusion, but he was unable to get that treatment as his symptoms worsened. His dtr decided he needed to come to the ED. No other aggravating or alleviating factors. No other treatments noted.    ED Course: Found to be COVID+. CXR c/w COVID PNA. [place] and decadron. TR[facility] called for admission.     Subjective:    Dale Sanford today denies any chest pain, fever or chills, report dyspnea on cough has been improving .    Assessment  & Plan :    Active Problems:   COVID-19  Acute Hypoxic Resp. Failure due to Acute Covid 19 Viral Pneumonitis during the ongoing 2020 Covid 19 Pandemic -  -He is unvaccinated. -Remains  on 5 L nasal cannula this morning -Continue with IV steroids, on IV solumedrol -Procalcitonin within normal range, no indication for antibiotics -To be started on  baricitinib - Encouraged the patient to sit up in chair in the daytime use I-S and flutter valve for pulmonary toiletry and then prone in bed when at night.  Will advance activity and titrate down oxygen as possible. - Actemra/Baricitinib  off label use - patient was told that if COVID-19 pneumonitis gets worse we might potentially use Actemra off label, patient denies any known history of active diverticulitis, tuberculosis or hepatitis, understands the risks and benefits and wants to proceed with baricitinib treatment if required.     SpO2: 92 % O2 Flow Rate (L/min): 5 L/min  Recent Labs  Lab 03/04/20 1241 03/04/20 1245 03/04/20 1248 03/04/20 1427 03/04/20 1840 03/05/20 0409 03/06/20 0414  WBC  --  8.9  --   --  8.0 5.1 12.1*  PLT  --  250  --   --  216 222 260  CRP  --  11.1*  --   --   --  11.2* 5.8*  BNP  --  86.7  --   --   --   --   --   DDIMER  --  1.07*  --   --   --  0.72* 0.46  PROCALCITON  --  0.13  --   --   --   --   --   AST  --  50*  --   --   --  28 20  ALT  --  92*  --   --   --  67* 48*  ALKPHOS  --  66  --   --   --  52 49  BILITOT  --  1.2  --   --   --  0.9 0.5  ALBUMIN  --  2.8*  --   --   --  2.2* 2.1*  LATICACIDVEN 1.7  --   --  1.2  --   --   --   SARSCOV2NAA  --   --  POSITIVE*  --   --   --   --        ABG     Component Value Date/Time   PHART 7.347 (L) 05/11/2018 0755   PCO2ART 38.0 05/11/2018 0755   PO2ART 73.0 (L) 05/11/2018 0755   HCO3 20.9 05/11/2018 0755   TCO2 22 05/11/2018 0755   ACIDBASEDEF 4.0 (H) 05/11/2018 0755   O2SAT 94.0 05/11/2018 0755      Elevated LFTs -Due to Covid, monitor closely, negative hepatitis panel.  hyperglycemia -CO 5.8, continue with insulin sliding scale   NICM/chronic combined HF     - last cardiology note 05/14/19 has him on Entresto, metoprolol, ASA     -Peers to be a euvolemic -Continue with Entresto, aspirin, I have resumed metoprolol 50 mg oral daily, uptitrate to home dose 200 mg if blood  pressure and heart rate tolerates.     Condition - Extremely Guarded  Family Communication  :  D/W daughter by phone daily. Code Status :  Full  Consults  :  none  Disposition Plan  :    Status is: Inpatient  Remains inpatient appropriate because:IV treatments appropriate due to intensity of illness or inability to take PO   Dispo: The patient is from: Home              Anticipated d/c is to: Home  Anticipated d/c date is: > 3 days              Patient currently is not medically stable to d/c.   remains with significant oxygen requirement on 5 L nasal cannula, on IV steroids,and IV  Remdesivir      DVT Prophylaxis  :  Lovenox   Lab Results  Component Value Date   PLT 260 03/06/2020    Diet :  Diet Order            Diet Heart Room service appropriate? Yes; Fluid consistency: Thin  Diet effective now                  Inpatient Medications  Scheduled Meds: . vitamin C  500 mg Oral Daily  . enoxaparin (LOVENOX) injection  40 mg Subcutaneous Q24H  . insulin aspart  0-15 Units Subcutaneous TID WC  . insulin aspart  0-5 Units Subcutaneous QHS  . Ipratropium-Albuterol  1 puff Inhalation Q6H  . methylPREDNISolone (SOLU-MEDROL) injection  40 mg Intravenous Q8H  . pantoprazole  40 mg Oral Daily  . phosphorus  250 mg Oral TID  . sacubitril-valsartan  1 tablet Oral BID  . zinc sulfate  220 mg Oral Daily   Continuous Infusions: . sodium chloride 1,000 mL (03/06/20 1001)  . remdesivir 100 mg in NS 100 mL 100 mg (03/06/20 1006)   PRN Meds:.chlorpheniramine-HYDROcodone, ondansetron **OR** ondansetron (ZOFRAN) IV  Antibiotics  :    Anti-infectives (From admission, onward)   Start     Dose/Rate Route Frequency Ordered Stop   03/06/20 1000  remdesivir 100 mg in sodium chloride 0.9 % 100 mL IVPB        100 mg 200 mL/hr over 30 Minutes Intravenous Daily 03/05/20 0729 03/10/20 0959   03/05/20 1000  remdesivir 200 mg in sodium chloride 0.9% 250 mL IVPB         200 mg 580 mL/hr over 30 Minutes Intravenous Once 03/05/20 0729 03/05/20 1056       Glendene Wyer M.D on 03/06/2020 at 1:19 PM  To page go to [email or web address]   [facility] -  Office  [phone]       Objective:   Vitals:   03/05/20 2018 03/06/20 0030 03/06/20 0424 03/06/20 1316  BP: (!) 144/64  (!) 167/61 133/63  Pulse: 92  75 87  Resp: 20  (!) 22 20  Temp: 97.6 F (36.4 C)  (!) 97.5 F (36.4 C) 97.6 F (36.4 C)  TempSrc: Oral  Oral Oral  SpO2: 93% 92% 93% 92%  Weight:      Height:        Wt Readings from Last 3 Encounters:  03/04/20 50.8 kg  05/14/19 63.1 kg  11/13/18 63 kg     Intake/Output Summary (Last 24 hours) at 03/06/2020 1319 Last data filed at 03/06/2020 0739 Gross per 24 hour  Intake 1899.68 ml  Output 500 ml  Net 1399.68 ml     Physical Exam  Awake Alert,No new F.N deficits, Normal affect Symmetrical Chest wall movement, Good air movement bilaterally, CTAB RRR,No Gallops,Rubs or new Murmurs, No Parasternal Heave +ve B.Sounds, Abd Soft, No tenderness, No rebound - guarding or rigidity. No Cyanosis, Clubbing or edema, No new Rash or bruise       Data Review:    CBC Recent Labs  Lab 03/04/20 1245 03/04/20 1840 03/05/20 0409 03/06/20 0414  WBC 8.9 8.0 5.1 12.1*  HGB 14.6 13.0 13.6 12.4*  HCT 39.4 35.5* 39.7 36.0*  PLT 250 216 222 260  MCV 88.3 88.5 88.8 87.2  MCH 32.7 32.4 30.4 30.0  MCHC 37.1* 36.6* 34.3 34.4  RDW 13.2 13.0 12.4 12.2  LYMPHSABS 0.8  --  0.4* 0.6*  MONOABS 0.4  --  0.1 0.4  EOSABS 0.1  --  0.0 0.0  BASOSABS 0.0  --  0.0 0.0    Recent Labs  Lab 03/04/20 1241 03/04/20 1245 03/04/20 1427 03/04/20 1840 03/05/20 0409 03/06/20 0414  NA  --  139  --   --  140 142  K  --  3.7  --   --  3.5 3.9  CL  --  103  --   --  110 111  CO2  --  25  --   --  18* 20*  GLUCOSE  --  116*  --   --  252* 183*  BUN  --  29*  --   --  29* 23  CREATININE  --  1.12  --  0.98 0.89 0.72  CALCIUM  --  7.9*  --    --  7.4* 7.3*  AST  --  50*  --   --  28 20  ALT  --  92*  --   --  67* 48*  ALKPHOS  --  66  --   --  52 49  BILITOT  --  1.2  --   --  0.9 0.5  ALBUMIN  --  2.8*  --   --  2.2* 2.1*  MG  --   --   --   --  1.9 1.7  CRP  --  11.1*  --   --  11.2* 5.8*  DDIMER  --  1.07*  --   --  0.72* 0.46  PROCALCITON  --  0.13  --   --   --   --   LATICACIDVEN 1.7  --  1.2  --   --   --   HGBA1C  --   --   --   --  5.8*  --   BNP  --  86.7  --   --   --   --     ------------------------------------------------------------------------------------------------------------------ Recent Labs    03/04/20 1245  TRIG 67    Lab Results  Component Value Date   HGBA1C 5.8 (H) 03/05/2020   ------------------------------------------------------------------------------------------------------------------ No results for input(s): TSH, T4TOTAL, T3FREE, THYROIDAB in the last 72 hours.  Invalid input(s): FREET3  Cardiac Enzymes No results for input(s): CKMB, TROPONINI, MYOGLOBIN in the last 168 hours.  Invalid input(s): CK ------------------------------------------------------------------------------------------------------------------    Component Value Date/Time   BNP 86.7 03/04/2020 1245    Micro Results Recent Results (from the past 240 hour(s))  Blood Culture (routine x 2)     Status: None (Preliminary result)   Collection Time: 03/04/20 12:42 PM   Specimen: BLOOD  Result Value Ref Range Status   Specimen Description   Final    BLOOD LEFT ANTECUBITAL Performed at [facility], [address], [place], [place] [address]    Special Requests   Final    BOTTLES DRAWN AEROBIC AND ANAEROBIC Blood Culture adequate volume Performed at [facility], [address], [place], [place] [address]    Culture   Final    NO GROWTH 2 DAYS Performed at [facility], [address], [place], [place] [address]    Report Status PENDING  Incomplete  Resp Panel  by RT-PCR (Flu  A&B, Covid) Nasopharyngeal Swab     Status: Abnormal   Collection Time: 03/04/20 12:48 PM   Specimen: Nasopharyngeal Swab; Nasopharyngeal(NP) swabs in vial transport medium  Result Value Ref Range Status   SARS Coronavirus 2 by RT PCR POSITIVE (A) NEGATIVE Final    Comment: RESULT CALLED TO, READ BACK BY AND VERIFIED WITH: SAVOIE,B. RN @1421  03/04/20 BILLINGSLEY,L (NOTE) SARS-CoV-2 target nucleic acids are DETECTED.  The SARS-CoV-2 RNA is generally detectable in upper respiratory specimens during the acute phase of infection. Positive results are indicative of the presence of the identified virus, but do not rule out bacterial infection or co-infection with other pathogens not detected by the test. Clinical correlation with patient history and other diagnostic information is necessary to determine patient infection status. The expected result is Negative.  Fact Sheet for Patients: 03/06/20  Fact Sheet for Healthcare Providers: BloggerCourse.com  This test is not yet approved or cleared by the [employer] FDA and  has been authorized for detection and/or diagnosis of SARS-CoV-2 by FDA under an Emergency Use Authorization (EUA).  This EUA will remain in effect (meaning this tes t can be used) for the duration of  the COVID-19 declaration under Section 564(b)(1) of the Act, 21 U.S.C. section 360bbb-3(b)(1), unless the authorization is terminated or revoked sooner.     Influenza A by PCR NEGATIVE NEGATIVE Final   Influenza B by PCR NEGATIVE NEGATIVE Final    Comment: (NOTE) The Xpert Xpress SARS-CoV-2/FLU/RSV plus assay is intended as an aid in the diagnosis of influenza from Nasopharyngeal swab specimens and should not be used as a sole basis for treatment. Nasal washings and aspirates are unacceptable for Xpert Xpress SARS-CoV-2/FLU/RSV testing.  Fact Sheet for  Patients: [place]  Fact Sheet for Healthcare Providers: BloggerCourse.com  This test is not yet approved or cleared by the [employer]ious[employer] FDA and has been authorized for detection and/or diagnosis of SARS-CoV-2 by FDA under an Emergency Use Authorization (EUA). This EUA will remain in effect (meaning this test can be used) for the duration of the COVID-19 declaration under Section 564(b)(1) of the Act, 21 U.S.C. section 360bbb-3(b)(1), unless the authorization is terminated or revoked.  Performed at [facility], [address], [place], [place]   Blood Culture (routine x 2)     Status: None (Preliminary result)   Collection Time: 03/04/20  2:25 PM   Specimen: Site Not Specified; Blood  Result Value Ref Range Status   Specimen Description   Final    SITE NOT SPECIFIED Performed at [facility], [address] [place], [place]    Special Requests   Final    BOTTLES DRAWN AEROBIC AND ANAEROBIC Blood Culture adequate volume Performed at [facility], [address] [place], [place]    Culture   Final    NO GROWTH 2 DAYS Performed at [facility], [address], [place], [place]    Report Status PENDING  Incomplete    Radiology Reports CT Head Wo Contrast  Result Date: 02/24/2020 CLINICAL DATA:  Head trauma, loss of consciousness EXAM: CT HEAD WITHOUT CONTRAST TECHNIQUE: Contiguous axial images were obtained from the base of the skull through the vertex without intravenous contrast. COMPARISON:  11/19/1999 MRI head report.  11/17/1999 head CT report. FINDINGS: Brain: No acute infarct or intracranial hemorrhage. No mass lesion. No midline shift, ventriculomegaly or extra-axial fluid collection. Mild cerebral atrophy with ex vacuo dilatation. Sequela of prior  bilateral frontal, left parietal and right cerebellar insults.  Vascular: No hyperdense vessel or unexpected calcification. Skull: Negative for fracture or focal lesion. Sinuses/Orbits: Normal orbits. Mild ethmoid and sphenoid sinus mucosal thickening. Clear mastoid air cells. Other: Left parietal scalp soft tissue swelling and skin staples. IMPRESSION: 1. No acute intracranial process.  Left parietal scalp laceration. 2. Remote bilateral frontal, left parietal and right cerebellar insults. 3. Mild sinus disease. Electronically Signed   By: Stana Bunting M.D.   On: 02/24/2020 09:25   DG Chest Port 1 View  Result Date: 03/04/2020 CLINICAL DATA:  Cough EXAM: PORTABLE CHEST 1 VIEW COMPARISON:  04/27/2015 and prior. FINDINGS: Hypoinflated lungs. No pneumothorax or pleural effusion. Postsurgical appearance of cardiomediastinal silhouette is unchanged. Patchy left predominant bilateral pulmonary opacities are new. No acute osseous abnormality. IMPRESSION: Bilateral pulmonary opacities, infection versus edema. Electronically Signed   By: Stana Bunting M.D.   On: 03/04/2020 13:17

## 2020-03-07 LAB — CBC WITH DIFFERENTIAL/PLATELET
Abs Immature Granulocytes: 0.17 10*3/uL — ABNORMAL HIGH (ref 0.00–0.07)
Basophils Absolute: 0 10*3/uL (ref 0.0–0.1)
Basophils Relative: 0 %
Eosinophils Absolute: 0 10*3/uL (ref 0.0–0.5)
Eosinophils Relative: 0 %
HCT: 36.4 % — ABNORMAL LOW (ref 39.0–52.0)
Hemoglobin: 13.3 g/dL (ref 13.0–17.0)
Immature Granulocytes: 2 %
Lymphocytes Relative: 7 %
Lymphs Abs: 0.7 10*3/uL (ref 0.7–4.0)
MCH: 33.3 pg (ref 26.0–34.0)
MCHC: 36.5 g/dL — ABNORMAL HIGH (ref 30.0–36.0)
MCV: 91 fL (ref 80.0–100.0)
Monocytes Absolute: 0.3 10*3/uL (ref 0.1–1.0)
Monocytes Relative: 3 %
Neutro Abs: 7.9 10*3/uL — ABNORMAL HIGH (ref 1.7–7.7)
Neutrophils Relative %: 88 %
Platelets: 308 10*3/uL (ref 150–400)
RBC: 4 MIL/uL — ABNORMAL LOW (ref 4.22–5.81)
RDW: 13.8 % (ref 11.5–15.5)
WBC: 9 10*3/uL (ref 4.0–10.5)
nRBC: 0 % (ref 0.0–0.2)

## 2020-03-07 LAB — COMPREHENSIVE METABOLIC PANEL
ALT: 43 U/L (ref 0–44)
AST: 29 U/L (ref 15–41)
Albumin: 2.2 g/dL — ABNORMAL LOW (ref 3.5–5.0)
Alkaline Phosphatase: 44 U/L (ref 38–126)
Anion gap: 12 (ref 5–15)
BUN: 22 mg/dL (ref 8–23)
CO2: 22 mmol/L (ref 22–32)
Calcium: 7.4 mg/dL — ABNORMAL LOW (ref 8.9–10.3)
Chloride: 110 mmol/L (ref 98–111)
Creatinine, Ser: 0.82 mg/dL (ref 0.61–1.24)
GFR, Estimated: >60 mL/min (ref >60)
Glucose, Bld: 136 mg/dL — ABNORMAL HIGH (ref 70–99)
Potassium: 3.9 mmol/L (ref 3.5–5.1)
Sodium: 144 mmol/L (ref 135–145)
Total Bilirubin: 0.9 mg/dL (ref 0.3–1.2)
Total Protein: 5.1 g/dL — ABNORMAL LOW (ref 6.5–8.1)

## 2020-03-07 LAB — GLUCOSE, CAPILLARY
Glucose-Capillary: 125 mg/dL — ABNORMAL HIGH (ref 70–99)
Glucose-Capillary: 250 mg/dL — ABNORMAL HIGH (ref 70–99)
Glucose-Capillary: 162 mg/dL — ABNORMAL HIGH (ref 70–99)
Glucose-Capillary: 176 mg/dL — ABNORMAL HIGH (ref 70–99)

## 2020-03-07 LAB — C-REACTIVE PROTEIN: CRP: 2.2 mg/dL — ABNORMAL HIGH (ref <1.0)

## 2020-03-07 LAB — FERRITIN: Ferritin: 799 ng/mL — ABNORMAL HIGH (ref 24–336)

## 2020-03-07 LAB — PHOSPHORUS: Phosphorus: 3.4 mg/dL (ref 2.5–4.6)

## 2020-03-07 LAB — D-DIMER, QUANTITATIVE: D-Dimer, Quant: 0.67 ug/mL-FEU — ABNORMAL HIGH (ref 0.00–0.50)

## 2020-03-07 LAB — MAGNESIUM: Magnesium: 1.8 mg/dL (ref 1.7–2.4)

## 2020-03-07 NOTE — Progress Notes (Signed)
PROGRESS NOTE  Dale Sanford ZOX:[id number] DOB: 1950-08-16 DOA: 03/04/2020 PCP: Clayborn Heron, MD   LOS: 3 days   Brief Narrative / Interim history: 69 y.o.malewith medical history significant ofDCM. Presenting with shortness of breath.Patient is a poor historian. Hx from dtr. The patient has not been feeling well for a week. He has had fatigue and dyspnea during that time. He has an exposure to COVID from his mother. So his daughter took him to get tested, and he was positive as well. He decided to isolate at home. The dtr noticed that over the weekend his PO intake had worsened and he had N/V. They spoke with his PCP to try to get MAB infusion, but he was unable to get that treatment as his symptoms worsened. His dtr decided he needed to come to the ED. No other aggravating or alleviating factors. No other treatments noted. He was Covid +, hypoxic and was admitted  Subjective / 24h Interval events: Wants to go home, he has "a lot of things to do at home".  Denies any shortness of breath  Assessment & Plan:  Principal Problem Acute Hypoxic Respiratory Failure due to Covid-19 Viral Illness -Remains hypoxic on 5 L.  He is unvaccinated -Started on remdesivir, steroids, baricitinib.  Continue.  Scheduled to finish remdesivir 12/20, if his oxygen does not get worse potentially home then -Sit up in the chair, incentive spirometry, flutter valve, wean oxygen as able   COVID-19 Labs  Recent Labs    03/04/20 1245 03/05/20 0409 03/06/20 0414 03/07/20 0329  DDIMER 1.07* 0.72* 0.46 0.67*  FERRITIN 1,310* 1,404* 898* 799*  LDH 384*  --   --   --   CRP 11.1* 11.2* 5.8* 2.2*    Lab Results  Component Value Date   SARSCOV2NAA POSITIVE (A) 03/04/2020   Active Problems Elevated LFTs -Due to Covid, monitor  NICM/chronic combined CHF -Continue Entresto, aspirin, metoprolol at a lower dose  Steroid-induced hyperglycemia -A1c 5.8, continue sliding scale  CBG (last 3)  Recent  Labs    03/06/20 1140 03/06/20 1605 03/06/20 2058  GLUCAP 233* 173* 126*     Scheduled Meds: . vitamin C  500 mg Oral Daily  . baricitinib  4 mg Oral Daily  . enoxaparin (LOVENOX) injection  40 mg Subcutaneous Q24H  . insulin aspart  0-15 Units Subcutaneous TID WC  . insulin aspart  0-5 Units Subcutaneous QHS  . Ipratropium-Albuterol  1 puff Inhalation Q6H  . methylPREDNISolone (SOLU-MEDROL) injection  40 mg Intravenous Q8H  . metoprolol  50 mg Oral Daily  . pantoprazole  40 mg Oral Daily  . phosphorus  250 mg Oral TID  . sacubitril-valsartan  1 tablet Oral BID  . zinc sulfate  220 mg Oral Daily   Continuous Infusions: . remdesivir 100 mg in NS 100 mL 100 mg (03/06/20 1006)   PRN Meds:.chlorpheniramine-HYDROcodone, ondansetron **OR** ondansetron (ZOFRAN) IV  DVT prophylaxis: Lovenox Code Status: Full code Family Communication: called daughter Gloris Manchester [phone]  Status is: Inpatient  Remains inpatient appropriate because:Inpatient level of care appropriate due to severity of illness  Dispo: The patient is from: Home              Anticipated d/c is to: Home              Anticipated d/c date is: 3 days              Patient currently is not medically stable to d/c.  Consultants:  None  Procedures:  None   Microbiology: None   Antibacterials: None    Objective: Vitals:   03/06/20 1316 03/06/20 2056 03/06/20 2100 03/07/20 0526  BP: 133/63 (!) 142/65  (!) 156/53  Pulse: 87 73  62  Resp: 20 (!) 28 20 (!) 21  Temp: 97.6 F (36.4 C) (!) 97.5 F (36.4 C)  98.7 F (37.1 C)  TempSrc: Oral Oral    SpO2: 92% 97% 96% 95%  Weight:      Height:        Intake/Output Summary (Last 24 hours) at 03/07/2020 3557 Last data filed at 03/06/2020 1500 Gross per 24 hour  Intake 604.82 ml  Output 300 ml  Net 304.82 ml   Filed Weights   03/04/20 1133  Weight: 50.8 kg    Examination:  Constitutional: NAD Eyes: no scleral icterus ENMT: Mucous membranes are  moist.  Neck: normal, supple Respiratory: Bibasilar rhonchi, no wheezing, no crackles Cardiovascular: Regular rate and rhythm, no murmurs / rubs / gallops. No LE edema.  Abdomen: non distended, no tenderness. Bowel sounds positive.  Musculoskeletal: no clubbing / cyanosis.  Skin: no rashes Neurologic: CN 2-12 grossly intact. Strength 5/5 in all 4.   Data Reviewed: I have independently reviewed following labs and imaging studies   CBC: Recent Labs  Lab 03/04/20 1245 03/04/20 1840 03/05/20 0409 03/06/20 0414 03/07/20 0329  WBC 8.9 8.0 5.1 12.1* 9.0  NEUTROABS 7.5  --  4.5 10.9* 7.9*  HGB 14.6 13.0 13.6 12.4* 13.3  HCT 39.4 35.5* 39.7 36.0* 36.4*  MCV 88.3 88.5 88.8 87.2 91.0  PLT 250 216 222 260 308   Basic Metabolic Panel: Recent Labs  Lab 03/04/20 1245 03/04/20 1840 03/05/20 0409 03/06/20 0414 03/07/20 0329  NA 139  --  140 142 144  K 3.7  --  3.5 3.9 3.9  CL 103  --  110 111 110  CO2 25  --  18* 20* 22  GLUCOSE 116*  --  252* 183* 136*  BUN 29*  --  29* 23 22  CREATININE 1.12 0.98 0.89 0.72 0.82  CALCIUM 7.9*  --  7.4* 7.3* 7.4*  MG  --   --  1.9 1.7 1.8  PHOS  --   --  3.6 2.2* 3.4   GFR: Estimated Creatinine Clearance: 59.4 mL/min (by C-G formula based on SCr of 0.82 mg/dL). Liver Function Tests: Recent Labs  Lab 03/04/20 1245 03/05/20 0409 03/06/20 0414 03/07/20 0329  AST 50* 28 20 29   ALT 92* 67* 48* 43  ALKPHOS 66 52 49 44  BILITOT 1.2 0.9 0.5 0.9  PROT 6.3* 5.5* 5.0* 5.1*  ALBUMIN 2.8* 2.2* 2.1* 2.2*   No results for input(s): LIPASE, AMYLASE in the last 168 hours. No results for input(s): AMMONIA in the last 168 hours. Coagulation Profile: No results for input(s): INR, PROTIME in the last 168 hours. Cardiac Enzymes: No results for input(s): CKTOTAL, CKMB, CKMBINDEX, TROPONINI in the last 168 hours. BNP (last 3 results) No results for input(s): PROBNP in the last 8760 hours. HbA1C: Recent Labs    03/05/20 0409  HGBA1C 5.8*    CBG: Recent Labs  Lab 03/05/20 2021 03/06/20 0733 03/06/20 1140 03/06/20 1605 03/06/20 2058  GLUCAP 180* 159* 233* 173* 126*   Lipid Profile: Recent Labs    03/04/20 1245  TRIG 67   Thyroid Function Tests: No results for input(s): TSH, T4TOTAL, FREET4, T3FREE, THYROIDAB in the last 72 hours. Anemia Panel: Recent Labs    03/06/20 0414  03/07/20 0329  FERRITIN 898* 799*   Urine analysis:    Component Value Date/Time   COLORURINE YELLOW 04/28/2015 0046   APPEARANCEUR CLEAR 04/28/2015 0046   LABSPEC 1.020 04/28/2015 0046   PHURINE 5.0 04/28/2015 0046   GLUCOSEU NEGATIVE 04/28/2015 0046   HGBUR NEGATIVE 04/28/2015 0046   BILIRUBINUR SMALL (A) 04/28/2015 0046   KETONESUR NEGATIVE 04/28/2015 0046   PROTEINUR NEGATIVE 04/28/2015 0046   NITRITE NEGATIVE 04/28/2015 0046   LEUKOCYTESUR NEGATIVE 04/28/2015 0046   Sepsis Labs: Invalid input(s): PROCALCITONIN, LACTICIDVEN  Recent Results (from the past 240 hour(s))  Blood Culture (routine x 2)     Status: None (Preliminary result)   Collection Time: 03/04/20 12:42 PM   Specimen: BLOOD  Result Value Ref Range Status   Specimen Description   Final    BLOOD LEFT ANTECUBITAL Performed at [facility], [address], [place], [place] [address]    Special Requests   Final    BOTTLES DRAWN AEROBIC AND ANAEROBIC Blood Culture adequate volume Performed at [facility], [address] [place], [place] [address]    Culture   Final    NO GROWTH 2 DAYS Performed at [facility], [address] [place], [place] [address]    Report Status PENDING  Incomplete  Resp Panel by RT-PCR (Flu A&B, Covid) Nasopharyngeal Swab     Status: Abnormal   Collection Time: 03/04/20 12:48 PM   Specimen: Nasopharyngeal Swab; Nasopharyngeal(NP) swabs in vial transport medium  Result Value Ref Range Status   SARS Coronavirus 2 by RT PCR POSITIVE (A) NEGATIVE Final    Comment: RESULT CALLED TO, READ  BACK BY AND VERIFIED WITH: SAVOIE,B. RN @1421  03/04/20 BILLINGSLEY,L (NOTE) SARS-CoV-2 target nucleic acids are DETECTED.  The SARS-CoV-2 RNA is generally detectable in upper respiratory specimens during the acute phase of infection. Positive results are indicative of the presence of the identified virus, but do not rule out bacterial infection or co-infection with other pathogens not detected by the test. Clinical correlation with patient history and other diagnostic information is necessary to determine patient infection status. The expected result is Negative.  Fact Sheet for Patients: 03/06/20  Fact Sheet for Healthcare Providers: BloggerCourse.com  This test is not yet approved or cleared by the [employer] FDA and  has been authorized for detection and/or diagnosis of SARS-CoV-2 by FDA under an Emergency Use Authorization (EUA).  This EUA will remain in effect (meaning this tes t can be used) for the duration of  the COVID-19 declaration under Section 564(b)(1) of the Act, 21 U.S.C. section 360bbb-3(b)(1), unless the authorization is terminated or revoked sooner.     Influenza A by PCR NEGATIVE NEGATIVE Final   Influenza B by PCR NEGATIVE NEGATIVE Final    Comment: (NOTE) The Xpert Xpress SARS-CoV-2/FLU/RSV plus assay is intended as an aid in the diagnosis of influenza from Nasopharyngeal swab specimens and should not be used as a sole basis for treatment. Nasal washings and aspirates are unacceptable for Xpert Xpress SARS-CoV-2/FLU/RSV testing.  Fact Sheet for Patients: [place]  Fact Sheet for Healthcare Providers: BloggerCourse.com  This test is not yet approved or cleared by the [employer]ious[employer] FDA and has been authorized for detection and/or diagnosis of SARS-CoV-2 by FDA under an Emergency Use Authorization (EUA). This EUA will remain in effect  (meaning this test can be used) for the duration of the COVID-19 declaration under Section 564(b)(1) of the Act, 21 U.S.C. section 360bbb-3(b)(1), unless the authorization is  terminated or revoked.  Performed at [facility], [address] [place], [place] [address]   Blood Culture (routine x 2)     Status: None (Preliminary result)   Collection Time: 03/04/20  2:25 PM   Specimen: Site Not Specified; Blood  Result Value Ref Range Status   Specimen Description   Final    SITE NOT SPECIFIED Performed at [facility], [address], [place], [place] [address]    Special Requests   Final    BOTTLES DRAWN AEROBIC AND ANAEROBIC Blood Culture adequate volume Performed at [facility], [address], [place], [place] [address]    Culture   Final    NO GROWTH 2 DAYS Performed at [facility], [address], [place], [place] [address]    Report Status PENDING  Incomplete      Radiology Studies: No results found.   Pamella Pert, MD, PhD [facility]  Between 7 am - 7 pm I am available, please contact me via Amion or Securechat  Between 7 pm - 7 am I am not available, please contact night coverage MD/APP via Amion

## 2020-03-07 NOTE — Progress Notes (Signed)
Patient Saturations on Room Air at Rest = 93%  Patient Saturations on Room Air while Ambulating = 90%  Patient Saturations on 0 Liters of oxygen while Ambulating = 90%

## 2020-03-08 LAB — CBC WITH DIFFERENTIAL/PLATELET
Abs Immature Granulocytes: 0.61 10*3/uL — ABNORMAL HIGH (ref 0.00–0.07)
Basophils Absolute: 0.1 10*3/uL (ref 0.0–0.1)
Basophils Relative: 0 %
Eosinophils Absolute: 0 10*3/uL (ref 0.0–0.5)
Eosinophils Relative: 0 %
HCT: 36.8 % — ABNORMAL LOW (ref 39.0–52.0)
Hemoglobin: 12.7 g/dL — ABNORMAL LOW (ref 13.0–17.0)
Immature Granulocytes: 5 %
Lymphocytes Relative: 12 %
Lymphs Abs: 1.7 10*3/uL (ref 0.7–4.0)
MCH: 29.3 pg (ref 26.0–34.0)
MCHC: 34.5 g/dL (ref 30.0–36.0)
MCV: 85 fL (ref 80.0–100.0)
Monocytes Absolute: 0.7 10*3/uL (ref 0.1–1.0)
Monocytes Relative: 5 %
Neutro Abs: 10.6 10*3/uL — ABNORMAL HIGH (ref 1.7–7.7)
Neutrophils Relative %: 78 %
Platelets: 326 10*3/uL (ref 150–400)
RBC: 4.33 MIL/uL (ref 4.22–5.81)
RDW: 12.1 % (ref 11.5–15.5)
WBC: 13.6 10*3/uL — ABNORMAL HIGH (ref 4.0–10.5)
nRBC: 0.1 % (ref 0.0–0.2)

## 2020-03-08 LAB — COMPREHENSIVE METABOLIC PANEL
ALT: 47 U/L — ABNORMAL HIGH (ref 0–44)
AST: 33 U/L (ref 15–41)
Albumin: 2.1 g/dL — ABNORMAL LOW (ref 3.5–5.0)
Alkaline Phosphatase: 52 U/L (ref 38–126)
Anion gap: 11 (ref 5–15)
BUN: 19 mg/dL (ref 8–23)
CO2: 24 mmol/L (ref 22–32)
Calcium: 7 mg/dL — ABNORMAL LOW (ref 8.9–10.3)
Chloride: 106 mmol/L (ref 98–111)
Creatinine, Ser: 0.85 mg/dL (ref 0.61–1.24)
GFR, Estimated: >60 mL/min (ref >60)
Glucose, Bld: 114 mg/dL — ABNORMAL HIGH (ref 70–99)
Potassium: 3.1 mmol/L — ABNORMAL LOW (ref 3.5–5.1)
Sodium: 141 mmol/L (ref 135–145)
Total Bilirubin: 0.4 mg/dL (ref 0.3–1.2)
Total Protein: 4.7 g/dL — ABNORMAL LOW (ref 6.5–8.1)

## 2020-03-08 LAB — GLUCOSE, CAPILLARY
Glucose-Capillary: 98 mg/dL (ref 70–99)
Glucose-Capillary: 203 mg/dL — ABNORMAL HIGH (ref 70–99)
Glucose-Capillary: 290 mg/dL — ABNORMAL HIGH (ref 70–99)
Glucose-Capillary: 130 mg/dL — ABNORMAL HIGH (ref 70–99)

## 2020-03-08 LAB — PHOSPHORUS: Phosphorus: 2.6 mg/dL (ref 2.5–4.6)

## 2020-03-08 LAB — D-DIMER, QUANTITATIVE: D-Dimer, Quant: 0.82 ug/mL-FEU — ABNORMAL HIGH (ref 0.00–0.50)

## 2020-03-08 LAB — FERRITIN: Ferritin: 708 ng/mL — ABNORMAL HIGH (ref 24–336)

## 2020-03-08 LAB — MAGNESIUM: Magnesium: 1.4 mg/dL — ABNORMAL LOW (ref 1.7–2.4)

## 2020-03-08 LAB — C-REACTIVE PROTEIN: CRP: 1.6 mg/dL — ABNORMAL HIGH (ref <1.0)

## 2020-03-08 MED ORDER — MAGNESIUM SULFATE 2 GM/50ML IV SOLN
2.0000 g | Freq: Once | INTRAVENOUS | Status: AC
  Administered 2020-03-08: 2 g via INTRAVENOUS
  Filled 2020-03-08: qty 50

## 2020-03-08 MED ORDER — POTASSIUM CHLORIDE CRYS ER 20 MEQ PO TBCR
40.0000 meq | EXTENDED_RELEASE_TABLET | Freq: Once | ORAL | Status: AC
  Administered 2020-03-08: 40 meq via ORAL
  Filled 2020-03-08: qty 2

## 2020-03-08 NOTE — Plan of Care (Signed)
POC discussed with pt, pt alert and oriented x4, shows no signs of distress, VSS. No complaints of pain. Pt O2 saturation 90%-95% on room air. Bed wheels locked, nonskid footwear applied OOB, call bell within reach, encouraged to use call bell for assistance, no falls during shift   Problem: Education: Goal: Knowledge of General Education information will improve Description: Including pain rating scale, medication(s)/side effects and non-pharmacologic comfort measures Outcome: Progressing   Problem: Health Behavior/Discharge Planning: Goal: Ability to manage health-related needs will improve Outcome: Progressing   Problem: Clinical Measurements: Goal: Ability to maintain clinical measurements within normal limits will improve Outcome: Progressing Goal: Will remain free from infection Outcome: Progressing Goal: Diagnostic test results will improve Outcome: Progressing Goal: Respiratory complications will improve Outcome: Progressing Goal: Cardiovascular complication will be avoided Outcome: Progressing   Problem: Activity: Goal: Risk for activity intolerance will decrease Outcome: Progressing   Problem: Nutrition: Goal: Adequate nutrition will be maintained Outcome: Progressing   Problem: Coping: Goal: Level of anxiety will decrease Outcome: Progressing   Problem: Elimination: Goal: Will not experience complications related to bowel motility Outcome: Progressing Goal: Will not experience complications related to urinary retention Outcome: Progressing   Problem: Pain Managment: Goal: General experience of comfort will improve Outcome: Progressing   Problem: Safety: Goal: Ability to remain free from injury will improve Outcome: Progressing   Problem: Skin Integrity: Goal: Risk for impaired skin integrity will decrease Outcome: Progressing   Problem: Coping: Goal: Psychosocial and spiritual needs will be supported Outcome: Progressing   Problem:  Respiratory: Goal: Will maintain a patent airway Outcome: Progressing Goal: Complications related to the disease process, condition or treatment will be avoided or minimized Outcome: Progressing

## 2020-03-08 NOTE — Progress Notes (Signed)
PROGRESS NOTE  Dale Sanford QQV:[id number] DOB: [birth year] DOA: 03/04/2020 PCP: Clayborn Heron, MD   LOS: 4 days   Brief Narrative / Interim history: 69 y.o.malewith medical history significant ofDCM. Presenting with shortness of breath.Patient is a poor historian. Hx from dtr. The patient has not been feeling well for a week. He has had fatigue and dyspnea during that time. He has an exposure to COVID from his mother. So his daughter took him to get tested, and he was positive as well. He decided to isolate at home. The dtr noticed that over the weekend his PO intake had worsened and he had N/V. They spoke with his PCP to try to get MAB infusion, but he was unable to get that treatment as his symptoms worsened. His dtr decided he needed to come to the ED. No other aggravating or alleviating factors. No other treatments noted. He was Covid +, hypoxic and was admitted  Subjective / 24h Interval events: Wants to go home.  Has been placed on room air however very fidgety in his bed as he moves around sats are in the low 80s.  Denies any shortness of breath  Assessment & Plan:  Principal Problem Acute Hypoxic Respiratory Failure due to Covid-19 Viral Illness -On room air this morning however sats are in the low 80s.  Denies feeling shortness of breath.  We will do ambulatory sats -Started on remdesivir, steroids, baricitinib.  Continue.  Scheduled to finish remdesivir 12/20, if his oxygen does not get worse potentially home on Monday after his last Remdesivir.  May need home O2 on discharge -Sit up in the chair, incentive spirometry, flutter valve, wean oxygen as able   COVID-19 Labs  Recent Labs    03/06/20 0414 03/07/20 0329 03/08/20 0324  DDIMER 0.46 0.67* 0.82*  FERRITIN 898* 799* 708*  CRP 5.8* 2.2* 1.6*    Lab Results  Component Value Date   SARSCOV2NAA POSITIVE (A) 03/04/2020   Active Problems Elevated LFTs -Due to Covid, monitor, overall LFTs are  stable  NICM/chronic combined CHF -Continue Entresto, aspirin, metoprolol at a lower dose.  Stable  Hypokalemia, hypomagnesemia -Replete and recheck  Steroid-induced hyperglycemia -A1c 5.8, continue sliding scale.  CBGs with decent control  CBG (last 3)  Recent Labs    03/07/20 2045 03/08/20 0747 03/08/20 1058  GLUCAP 176* 98 203*   Scheduled Meds: . vitamin C  500 mg Oral Daily  . baricitinib  4 mg Oral Daily  . enoxaparin (LOVENOX) injection  40 mg Subcutaneous Q24H  . insulin aspart  0-15 Units Subcutaneous TID WC  . insulin aspart  0-5 Units Subcutaneous QHS  . Ipratropium-Albuterol  1 puff Inhalation Q6H  . methylPREDNISolone (SOLU-MEDROL) injection  40 mg Intravenous Q8H  . metoprolol  50 mg Oral Daily  . pantoprazole  40 mg Oral Daily  . potassium chloride  40 mEq Oral Once  . sacubitril-valsartan  1 tablet Oral BID  . zinc sulfate  220 mg Oral Daily   Continuous Infusions: . remdesivir 100 mg in NS 100 mL 100 mg (03/08/20 0956)   PRN Meds:.chlorpheniramine-HYDROcodone, ondansetron **OR** ondansetron (ZOFRAN) IV  DVT prophylaxis: Lovenox Code Status: Full code Family Communication: called daughter Gloris Manchester [phone]  Status is: Inpatient  Remains inpatient appropriate because:Inpatient level of care appropriate due to severity of illness  Dispo: The patient is from: Home              Anticipated d/c is to: Home  Anticipated d/c date is: 71 days              Patient currently is not medically stable to d/c.  Consultants:  None   Procedures:  None   Microbiology: None   Antibacterials: None    Objective: Vitals:   03/08/20 0518 03/08/20 0530 03/08/20 0722 03/08/20 0925  BP: (!) 132/55  (!) 134/57 140/62  Pulse: 84  76 76  Resp: (!) 28 (!) 22 (!) 21   Temp: 98.4 F (36.9 C)  98.4 F (36.9 C) 98.2 F (36.8 C)  TempSrc: Oral  Oral Oral  SpO2: (!) 87% 92% 91% 94%  Weight:      Height:        Intake/Output Summary (Last 24  hours) at 03/08/2020 1113 Last data filed at 71/18/2021 1810 Gross per 24 hour  Intake 600 ml  Output --  Net 600 ml   Filed Weights   03/04/20 1133  Weight: 50.8 kg    Examination:  Constitutional: No distress, in bed, appears comfortable Eyes: No scleral icterus ENMT: Mucous membranes are moist.  Neck: normal, supple Respiratory: Bibasilar rhonchi, no wheezing, crackles Cardiovascular: Regular rate and rhythm, no murmurs, no peripheral edema Abdomen: Soft, nontender, nondistended, bowel sounds positive Musculoskeletal: no clubbing / cyanosis.  Skin: No rashes appreciated Neurologic: Nonfocal, equal strength  Data Reviewed: I have independently reviewed following labs and imaging studies   CBC: Recent Labs  Lab 03/04/20 1245 03/04/20 1840 03/05/20 0409 03/06/20 0414 03/07/20 0329 03/08/20 0324  WBC 8.9 8.0 5.1 12.1* 9.0 13.6*  NEUTROABS 7.5  --  4.5 10.9* 7.9* 10.6*  HGB 14.6 13.0 13.6 12.4* 13.3 12.7*  HCT 39.4 35.5* 39.7 36.0* 36.4* 36.8*  MCV 88.3 88.5 88.8 87.2 91.0 85.0  PLT 250 216 222 260 308 326   Basic Metabolic Panel: Recent Labs  Lab 03/04/20 1245 03/04/20 1840 03/05/20 0409 03/06/20 0414 03/07/20 0329 03/08/20 0324  NA 139  --  140 142 144 141  K 3.7  --  3.5 3.9 3.9 3.1*  CL 103  --  110 111 110 106  CO2 25  --  18* 20* 22 24  GLUCOSE 116*  --  252* 183* 136* 114*  BUN 29*  --  29* 23 22 19   CREATININE 1.12 0.98 0.89 0.72 0.82 0.85  CALCIUM 7.9*  --  7.4* 7.3* 7.4* 7.0*  MG  --   --  1.9 1.7 1.8 1.4*  PHOS  --   --  3.6 2.2* 3.4 2.6   GFR: Estimated Creatinine Clearance: 57.3 mL/min (by C-G formula based on SCr of 0.85 mg/dL). Liver Function Tests: Recent Labs  Lab 03/04/20 1245 03/05/20 0409 03/06/20 0414 03/07/20 0329 03/08/20 0324  AST 50* 28 20 29  33  ALT 92* 67* 48* 43 47*  ALKPHOS 66 52 49 44 52  BILITOT 1.2 0.9 0.5 0.9 0.4  PROT 6.3* 5.5* 5.0* 5.1* 4.7*  ALBUMIN 2.8* 2.2* 2.1* 2.2* 2.1*   No results for input(s):  LIPASE, AMYLASE in the last 168 hours. No results for input(s): AMMONIA in the last 168 hours. Coagulation Profile: No results for input(s): INR, PROTIME in the last 168 hours. Cardiac Enzymes: No results for input(s): CKTOTAL, CKMB, CKMBINDEX, TROPONINI in the last 168 hours. BNP (last 3 results) No results for input(s): PROBNP in the last 8760 hours. HbA1C: No results for input(s): HGBA1C in the last 72 hours. CBG: Recent Labs  Lab 03/07/20 1157 03/07/20 1637 03/07/20 2045 03/08/20 0747 03/08/20  1058  GLUCAP 250* 162* 176* 98 203*   Lipid Profile: No results for input(s): CHOL, HDL, LDLCALC, TRIG, CHOLHDL, LDLDIRECT in the last 72 hours. Thyroid Function Tests: No results for input(s): TSH, T4TOTAL, FREET4, T3FREE, THYROIDAB in the last 72 hours. Anemia Panel: Recent Labs    03/07/20 0329 03/08/20 0324  FERRITIN 799* 708*   Urine analysis:    Component Value Date/Time   COLORURINE YELLOW 04/28/2015 0046   APPEARANCEUR CLEAR 04/28/2015 0046   LABSPEC 1.020 04/28/2015 0046   PHURINE 5.0 04/28/2015 0046   GLUCOSEU NEGATIVE 04/28/2015 0046   HGBUR NEGATIVE 04/28/2015 0046   BILIRUBINUR SMALL (A) 04/28/2015 0046   KETONESUR NEGATIVE 04/28/2015 0046   PROTEINUR NEGATIVE 04/28/2015 0046   NITRITE NEGATIVE 04/28/2015 0046   LEUKOCYTESUR NEGATIVE 04/28/2015 0046   Sepsis Labs: Invalid input(s): PROCALCITONIN, LACTICIDVEN  Recent Results (from the past 240 hour(s))  Blood Culture (routine x 2)     Status: None (Preliminary result)   Collection Time: 03/04/20 12:42 PM   Specimen: BLOOD  Result Value Ref Range Status   Specimen Description   Final    BLOOD LEFT ANTECUBITAL Performed at [facility], [address] [place], [place] [address]    Special Requests   Final    BOTTLES DRAWN AEROBIC AND ANAEROBIC Blood Culture adequate volume Performed at [facility], [address], [place], [place] [address]    Culture   Final     NO GROWTH 3 DAYS Performed at [facility], [address], [place], [place] [address]    Report Status PENDING  Incomplete  Resp Panel by RT-PCR (Flu A&B, Covid) Nasopharyngeal Swab     Status: Abnormal   Collection Time: 03/04/20 12:48 PM   Specimen: Nasopharyngeal Swab; Nasopharyngeal(NP) swabs in vial transport medium  Result Value Ref Range Status   SARS Coronavirus 2 by RT PCR POSITIVE (A) NEGATIVE Final    Comment: RESULT CALLED TO, READ BACK BY AND VERIFIED WITH: SAVOIE,B. RN @1421  03/04/20 BILLINGSLEY,L (NOTE) SARS-CoV-2 target nucleic acids are DETECTED.  The SARS-CoV-2 RNA is generally detectable in upper respiratory specimens during the acute phase of infection. Positive results are indicative of the presence of the identified virus, but do not rule out bacterial infection or co-infection with other pathogens not detected by the test. Clinical correlation with patient history and other diagnostic information is necessary to determine patient infection status. The expected result is Negative.  Fact Sheet for Patients: 03/06/20  Fact Sheet for Healthcare Providers: BloggerCourse.com  This test is not yet approved or cleared by the [employer]iousBroker.[employer] FDA and  has been authorized for detection and/or diagnosis of SARS-CoV-2 by FDA under an Emergency Use Authorization (EUA).  This EUA will remain in effect (meaning this tes t can be used) for the duration of  the COVID-19 declaration under Section 564(b)(1) of the Act, 21 U.S.C. section 360bbb-3(b)(1), unless the authorization is terminated or revoked sooner.     Influenza A by PCR NEGATIVE NEGATIVE Final   Influenza B by PCR NEGATIVE NEGATIVE Final    Comment: (NOTE) The Xpert Xpress SARS-CoV-2/FLU/RSV plus assay is intended as an aid in the diagnosis of influenza from Nasopharyngeal swab specimens and should not be used as a sole basis for treatment. Nasal  washings and aspirates are unacceptable for Xpert Xpress SARS-CoV-2/FLU/RSV testing.  Fact Sheet for Patients: [place]  Fact Sheet for Healthcare Providers: BloggerCourse.com  This test is not yet approved or cleared by the [employer]ious[employer]ker.it  States FDA and has been authorized for detection and/or diagnosis of SARS-CoV-2 by FDA under an Emergency Use Authorization (EUA). This EUA will remain in effect (meaning this test can be used) for the duration of the COVID-19 declaration under Section 564(b)(1) of the Act, 21 U.S.C. section 360bbb-3(b)(1), unless the authorization is terminated or revoked.  Performed at [facility], [address], [place], [place] [address]   Blood Culture (routine x 2)     Status: None (Preliminary result)   Collection Time: 03/04/20  2:25 PM   Specimen: Site Not Specified; Blood  Result Value Ref Range Status   Specimen Description   Final    SITE NOT SPECIFIED Performed at [facility], [address], [place], [place] [address]    Special Requests   Final    BOTTLES DRAWN AEROBIC AND ANAEROBIC Blood Culture adequate volume Performed at [facility], [address], [place], [place] [address]    Culture   Final    NO GROWTH 3 DAYS Performed at [facility], [address], [place], [place] [address]    Report Status PENDING  Incomplete      Radiology Studies: No results found.   Pamella Pert, MD, PhD [facility]  Between 7 am - 7 pm I am available, please contact me via Amion or Securechat  Between 7 pm - 7 am I am not available, please contact night coverage MD/APP via Amion

## 2020-03-08 NOTE — Progress Notes (Signed)
   03/08/20 0530  Assess: MEWS Score  Resp (!) 22  Level of Consciousness Alert  SpO2 92 % (Green MEWS obtained immediately without intervention)  O2 Device Room Air  O2 Flow Rate (L/min) 0 L/min  Assess: MEWS Score  MEWS Temp 0  MEWS Systolic 0  MEWS Pulse 0  MEWS RR 1  MEWS LOC 0  MEWS Score 1  MEWS Score Color Green  Treat  MEWS Interventions Other (Comment) (Recheck vitals and focused system assessment)   MEWS green on immediate reassessment by this RN. Patient no longer in Yellow MEWS. No s/s of pain or respiratory distress noted. Patient denies any discomfort or SOB. Monitoring continued.

## 2020-03-09 LAB — COMPREHENSIVE METABOLIC PANEL
ALT: 52 U/L — ABNORMAL HIGH (ref 0–44)
AST: 26 U/L (ref 15–41)
Albumin: 2.3 g/dL — ABNORMAL LOW (ref 3.5–5.0)
Alkaline Phosphatase: 48 U/L (ref 38–126)
Anion gap: 10 (ref 5–15)
BUN: 15 mg/dL (ref 8–23)
CO2: 24 mmol/L (ref 22–32)
Calcium: 6.9 mg/dL — ABNORMAL LOW (ref 8.9–10.3)
Chloride: 104 mmol/L (ref 98–111)
Creatinine, Ser: 0.83 mg/dL (ref 0.61–1.24)
GFR, Estimated: >60 mL/min (ref >60)
Glucose, Bld: 158 mg/dL — ABNORMAL HIGH (ref 70–99)
Potassium: 3.7 mmol/L (ref 3.5–5.1)
Sodium: 138 mmol/L (ref 135–145)
Total Bilirubin: 0.8 mg/dL (ref 0.3–1.2)
Total Protein: 4.9 g/dL — ABNORMAL LOW (ref 6.5–8.1)

## 2020-03-09 LAB — CBC WITH DIFFERENTIAL/PLATELET
Abs Immature Granulocytes: 0.78 10*3/uL — ABNORMAL HIGH (ref 0.00–0.07)
Basophils Absolute: 0.1 10*3/uL (ref 0.0–0.1)
Basophils Relative: 0 %
Eosinophils Absolute: 0 10*3/uL (ref 0.0–0.5)
Eosinophils Relative: 0 %
HCT: 36 % — ABNORMAL LOW (ref 39.0–52.0)
Hemoglobin: 12.9 g/dL — ABNORMAL LOW (ref 13.0–17.0)
Immature Granulocytes: 6 %
Lymphocytes Relative: 7 %
Lymphs Abs: 0.9 10*3/uL (ref 0.7–4.0)
MCH: 30.4 pg (ref 26.0–34.0)
MCHC: 35.8 g/dL (ref 30.0–36.0)
MCV: 84.9 fL (ref 80.0–100.0)
Monocytes Absolute: 0.4 10*3/uL (ref 0.1–1.0)
Monocytes Relative: 4 %
Neutro Abs: 10 10*3/uL — ABNORMAL HIGH (ref 1.7–7.7)
Neutrophils Relative %: 83 %
Platelets: 312 10*3/uL (ref 150–400)
RBC: 4.24 MIL/uL (ref 4.22–5.81)
RDW: 12.1 % (ref 11.5–15.5)
WBC: 12.1 10*3/uL — ABNORMAL HIGH (ref 4.0–10.5)
nRBC: 0.2 % (ref 0.0–0.2)

## 2020-03-09 LAB — CULTURE, BLOOD (ROUTINE X 2)
Culture: NO GROWTH
Culture: NO GROWTH
Special Requests: ADEQUATE
Special Requests: ADEQUATE

## 2020-03-09 LAB — PHOSPHORUS: Phosphorus: 2.8 mg/dL (ref 2.5–4.6)

## 2020-03-09 LAB — FERRITIN: Ferritin: 692 ng/mL — ABNORMAL HIGH (ref 24–336)

## 2020-03-09 LAB — D-DIMER, QUANTITATIVE: D-Dimer, Quant: 0.75 ug/mL-FEU — ABNORMAL HIGH (ref 0.00–0.50)

## 2020-03-09 LAB — C-REACTIVE PROTEIN: CRP: 1.5 mg/dL — ABNORMAL HIGH (ref <1.0)

## 2020-03-09 LAB — GLUCOSE, CAPILLARY
Glucose-Capillary: 124 mg/dL — ABNORMAL HIGH (ref 70–99)
Glucose-Capillary: 295 mg/dL — ABNORMAL HIGH (ref 70–99)

## 2020-03-09 LAB — MAGNESIUM: Magnesium: 1.8 mg/dL (ref 1.7–2.4)

## 2020-03-09 MED ORDER — DEXAMETHASONE 6 MG PO TABS: 6.0000 mg | ORAL_TABLET | Freq: Every day | ORAL | 0 refills | Status: AC

## 2020-03-09 NOTE — Progress Notes (Signed)
SATURATION QUALIFICATIONS: (This note is used to comply with regulatory documentation for home oxygen)  Patient Saturations on Room Air at Rest = 93%  Patient Saturations on Room Air while Ambulating = 87%  Patient Saturations on 3 Liters of oxygen while Ambulating = 94%  Please briefly explain why patient needs home oxygen: Patient O2 Saturation will drop with exertion. Will need 3 liters of home O2.

## 2020-03-09 NOTE — TOC Transition Note (Signed)
Transition of Care [facility]) - CM/SW Discharge Note   Patient Details  Name: Dale Sanford MRN: [id number] Date of Birth: 1950/05/02  Transition of [facility]) CM/SW Contact:  Ida Rogue, LCSW Phone Number: 03/09/2020, 10:39 AM   Clinical Narrative:   Patient who is scheduled for d/c today is in need of home O2, has a ride.  SAT note and orders seen and appreciated. Contacted Zach with [employer] who will set up delivery of travel cannister and home unit.  No further needs identified.  TOC sign off.    Final next level of care: Home/Self Care Barriers to Discharge: No Barriers Identified   Patient Goals and CMS Choice        Discharge Placement                       Discharge Plan and Services                                     Social Determinants of Health (SDOH) Interventions     Readmission Risk Interventions No flowsheet data found.

## 2020-03-09 NOTE — Discharge Summary (Signed)
Physician Discharge Summary  Dale Sanford:[id number] DOB: 27-Nov-1950 DOA: 03/04/2020  PCP: Clayborn Heron, MD  Admit date: 03/04/2020 Discharge date: 03/09/2020  Admitted From: Home Disposition: Home   Recommendations for Outpatient Follow-up:  1. Follow up with PCP in 1-2 weeks  Home Health: None Equipment/Devices: 4L O2 with exertion Discharge Condition: Stable CODE STATUS: Full Diet recommendation: Heart healthy.  Brief/Interim Summary: 69 y.o.malewith medical history significant ofDCM. Presenting with shortness of breath.Patient is a poor historian. Hx from dtr. The patient has not been feeling well for a week. He has had fatigue and dyspnea during that time. He has an exposure to COVID from his mother. So his daughter took him to get tested, and he was positive as well. He decided to isolate at home. The dtr noticed that over the weekend his PO intake had worsened and he had N/V. They spoke with his PCP to try to get MAB infusion, but he was unable to get that treatment as his symptoms worsened. His dtr decided he needed to come to the ED. No other aggravating or alleviating factors. No other treatments noted. He was Covid +, hypoxic and was admitted. With treatment hypoxia improved and dyspnea resolved.   Discharge Diagnoses:  Active Problems:   COVID-19  Acute Hypoxic Respiratory Failure due to Covid-19 pneumonia: Bilateral pulmonary opacities on CXR.  - Comfortable on room air, no hypoxia at rest, but some hypoxia on exertion, will provide supplemental oxygen at discharge.  - Completed remdesivir.  - Complete steroids at discharge.   Elevated LFTs -Due to Covid, monitor, overall LFTs are stable  NICM/chronic combined CHF -Continue Entresto, aspirin, metoprolol at a lower dose.  Stable  Hypokalemia, hypomagnesemia -Replete and recheck  Steroid-induced hyperglycemia -A1c 5.8, continue sliding scale.  CBGs with decent control  Discharge  Instructions Discharge Instructions    Diet - low sodium heart healthy   Complete by: As directed    Discharge instructions   Complete by: As directed    You are being discharged from the hospital after treatment for covid-19 infection. You are felt to be stable enough to no longer require inpatient monitoring, testing, and treatment, though you will need to follow the recommendations below: - Continue taking decadron (steroid) once a day for 5 more days (sent to your pharmacy). Otherwise, continue taking your home medications as you were.  - Per CDC guidelines, you will need to remain in isolation for 21 days from your first positive covid test. - Follow up with your doctor in the next week via telehealth or seek medical attention right away if your symptoms get WORSE.  - You are encouraged to get a covid vaccination between 21 days (after isolation period ends) and 90 days (before immunity is thought to wear off).  Directions for you at home:  Wear a facemask You should wear a facemask that covers your nose and mouth when you are in the same room with other people and when you visit a healthcare provider. People who live with or visit you should also wear a facemask while they are in the same room with you.  Separate yourself from other people in your home As much as possible, you should stay in a different room from other people in your home. Also, you should use a separate bathroom, if available.  Avoid sharing household items You should not share dishes, drinking glasses, cups, eating utensils, towels, bedding, or other items with other people in your home. After using these items,  you should wash them thoroughly with soap and water.  Cover your coughs and sneezes Cover your mouth and nose with a tissue when you cough or sneeze, or you can cough or sneeze into your sleeve. Throw used tissues in a lined trash can, and immediately wash your hands with soap and water for at least 20  seconds or use an alcohol-based hand rub.  Wash your [employer] your hands often and thoroughly with soap and water for at least 20 seconds. You can use an alcohol-based hand sanitizer if soap and water are not available and if your hands are not visibly dirty. Avoid touching your eyes, nose, and mouth with unwashed hands.  Directions for those who live with, or provide care at home for you:  Limit the number of people who have contact with the patient If possible, have only one caregiver for the patient. Other household members should stay in another home or place of residence. If this is not possible, they should stay in another room, or be separated from the patient as much as possible. Use a separate bathroom, if available. Restrict visitors who do not have an essential need to be in the home.  Ensure good ventilation Make sure that shared spaces in the home have good air flow, such as from an air conditioner or an opened window, weather permitting.  Wash your hands often Wash your hands often and thoroughly with soap and water for at least 20 seconds. You can use an alcohol based hand sanitizer if soap and water are not available and if your hands are not visibly dirty. Avoid touching your eyes, nose, and mouth with unwashed hands. Use disposable paper towels to dry your hands. If not available, use dedicated cloth towels and replace them when they become wet.  Wear a facemask and gloves Wear a disposable facemask at all times in the room and gloves when you touch or have contact with the patient's blood, body fluids, and/or secretions or excretions, such as sweat, saliva, sputum, nasal mucus, vomit, urine, or feces.  Ensure the mask fits over your nose and mouth tightly, and do not touch it during use. Throw out disposable facemasks and gloves after using them. Do not reuse. Wash your hands immediately after removing your facemask and gloves. If your personal clothing becomes  contaminated, carefully remove clothing and launder. Wash your hands after handling contaminated clothing. Place all used disposable facemasks, gloves, and other waste in a lined container before disposing them with other household waste. Remove gloves and wash your hands immediately after handling these items.  Do not share dishes, glasses, or other household items with the patient Avoid sharing household items. You should not share dishes, drinking glasses, cups, eating utensils, towels, bedding, or other items with a patient who is confirmed to have, or being evaluated for, COVID-19 infection. After the person uses these items, you should wash them thoroughly with soap and water.  Wash laundry thoroughly Immediately remove and wash clothes or bedding that have blood, body fluids, and/or secretions or excretions, such as sweat, saliva, sputum, nasal mucus, vomit, urine, or feces, on them. Wear gloves when handling laundry from the patient. Read and follow directions on labels of laundry or clothing items and detergent. In general, wash and dry with the warmest temperatures recommended on the label.  Clean all areas the individual has used often Clean all touchable surfaces, such as counters, tabletops, doorknobs, bathroom fixtures, toilets, phones, keyboards, tablets, and bedside tables, every day.  Also, clean any surfaces that may have blood, body fluids, and/or secretions or excretions on them. Wear gloves when cleaning surfaces the patient has come in contact with. Use a diluted bleach solution (e.g., dilute bleach with 1 part bleach and 10 parts water) or a household disinfectant with a label that says EPA-registered for coronaviruses. To make a bleach solution at home, add 1 tablespoon of bleach to 1 quart (4 cups) of water. For a larger supply, add  cup of bleach to 1 gallon (16 cups) of water. Read labels of cleaning products and follow recommendations provided on product labels. Labels  contain instructions for safe and effective use of the cleaning product including precautions you should take when applying the product, such as wearing gloves or eye protection and making sure you have good ventilation during use of the product. Remove gloves and wash hands immediately after cleaning.  Monitor yourself for signs and symptoms of illness Caregivers and household members are considered close contacts, should monitor their health, and will be asked to limit movement outside of the home to the extent possible. Follow the monitoring steps for close contacts listed on the symptom monitoring form.  If you have additional questions, contact your local health department or call the epidemiologist on call at [phone] (available 24/7). This guidance is subject to change. For the most up-to-date guidance from [facility], please refer to their website: TripMetro.hu   Increase activity slowly   Complete by: As directed    MyChart COVID-19 home monitoring program   Complete by: Mar 09, 2020    Is the patient willing to use the MyChart Mobile App for home monitoring?: Yes     Allergies as of 03/09/2020      Reactions   Nyquil Multi-symptom [pseudoeph-doxylamine-dm-apap] Itching, Rash      Medication List    TAKE these medications   aspirin EC 81 MG tablet Take 1 tablet (81 mg total) by mouth daily.   dexamethasone 6 MG tablet Commonly known as: Decadron Take 1 tablet (6 mg total) by mouth daily for 5 days.   Entresto 97-103 MG Generic drug: sacubitril-valsartan TAKE 1 TABLET BY MOUTH TWICE A DAY   metoprolol 200 MG 24 hr tablet Commonly known as: TOPROL-XL TAKE 1 TABLET BY MOUTH EVERY DAY       Follow-up Information    Rankins, Fanny Dance, MD. Schedule an appointment as soon as possible for a visit.   Specialty: Family Medicine Contact information: [address] [place] [address] [phone]         Quintella Reichert, MD .   Specialty: Cardiology Contact information: [phone] [address] [place] [address] [phone]              Allergies  Allergen Reactions  . Nyquil Multi-Symptom [Pseudoeph-Doxylamine-Dm-Apap] Itching and Rash    Consultations:  None  Procedures/Studies: CT Head Wo Contrast  Result Date: 02/24/2020 CLINICAL DATA:  Head trauma, loss of consciousness EXAM: CT HEAD WITHOUT CONTRAST TECHNIQUE: Contiguous axial images were obtained from the base of the skull through the vertex without intravenous contrast. COMPARISON:  11/19/1999 MRI head report.  11/17/1999 head CT report. FINDINGS: Brain: No acute infarct or intracranial hemorrhage. No mass lesion. No midline shift, ventriculomegaly or extra-axial fluid collection. Mild cerebral atrophy with ex vacuo dilatation. Sequela of prior bilateral frontal, left parietal and right cerebellar insults. Vascular: No hyperdense vessel or unexpected calcification. Skull: Negative for fracture or focal lesion. Sinuses/Orbits: Normal orbits. Mild ethmoid and  sphenoid sinus mucosal thickening. Clear mastoid air cells. Other: Left parietal scalp soft tissue swelling and skin staples. IMPRESSION: 1. No acute intracranial process.  Left parietal scalp laceration. 2. Remote bilateral frontal, left parietal and right cerebellar insults. 3. Mild sinus disease. Electronically Signed   By: Stana Bunting M.D.   On: 02/24/2020 09:25   DG Chest Port 1 View  Result Date: 03/04/2020 CLINICAL DATA:  Cough EXAM: PORTABLE CHEST 1 VIEW COMPARISON:  04/27/2015 and prior. FINDINGS: Hypoinflated lungs. No pneumothorax or pleural effusion. Postsurgical appearance of cardiomediastinal silhouette is unchanged. Patchy left predominant bilateral pulmonary opacities are new. No acute osseous abnormality. IMPRESSION: Bilateral pulmonary opacities, infection versus edema. Electronically Signed   By: Stana Bunting M.D.   On:  03/04/2020 13:17      Subjective: Feels well, wants to go home. No dyspnea, pain. Still has stable in his head from ED visit ~10 days ago. These will be removed prior to discharge.   Discharge Exam: Vitals:   03/08/20 1436 03/09/20 0459  BP: (!) 141/60 (!) 151/50  Pulse: 83 63  Resp:  (!) 25  Temp: 97.6 F (36.4 C) 98 F (36.7 C)  SpO2: 93% 98%   General: Pt is alert, awake, not in acute distress Cardiovascular: RRR, S1/S2 +, no rubs, no gallops Respiratory: CTA bilaterally, no wheezing, no rhonchi Abdominal: Soft, NT, ND, bowel sounds + Extremities: No edema, no cyanosis  Labs: BNP (last 3 results) Recent Labs    03/04/20 1245  BNP 86.7   Basic Metabolic Panel: Recent Labs  Lab 03/05/20 0409 03/06/20 0414 03/07/20 0329 03/08/20 0324 03/09/20 0325  NA 140 142 144 141 138  K 3.5 3.9 3.9 3.1* 3.7  CL 110 111 110 106 104  CO2 18* 20* GLUCOSE 252* 183* 136* 114* 158*  BUN 29* CREATININE 0.89 0.72 0.82 0.85 0.83  CALCIUM 7.4* 7.3* 7.4* 7.0* 6.9*  MG 1.9 1.7 1.8 1.4* 1.8  PHOS 3.6 2.2* 3.4 2.6 2.8   Liver Function Tests: Recent Labs  Lab 03/05/20 0409 03/06/20 0414 03/07/20 0329 03/08/20 0324 03/09/20 0325  AST 33 26  ALT 67* 48* 43 47* 52*  ALKPHOS 52 49 44 52 48  BILITOT 0.9 0.5 0.9 0.4 0.8  PROT 5.5* 5.0* 5.1* 4.7* 4.9*  ALBUMIN 2.2* 2.1* 2.2* 2.1* 2.3*   No results for input(s): LIPASE, AMYLASE in the last 168 hours. No results for input(s): AMMONIA in the last 168 hours. CBC: Recent Labs  Lab 03/05/20 0409 03/06/20 0414 03/07/20 0329 03/08/20 0324 03/09/20 0325  WBC 5.1 12.1* 9.0 13.6* 12.1*  NEUTROABS 4.5 10.9* 7.9* 10.6* 10.0*  HGB 13.6 12.4* 13.3 12.7* 12.9*  HCT 39.7 36.0* 36.4* 36.8* 36.0*  MCV 88.8 87.2 91.0 85.0 84.9  PLT 222 260 308 326 312   Cardiac Enzymes: No results for input(s): CKTOTAL, CKMB, CKMBINDEX, TROPONINI in the last 168 hours. BNP: Invalid input(s): POCBNP CBG: Recent Labs   Lab 03/08/20 1058 03/08/20 1551 03/08/20 2111 03/09/20 0742 03/09/20 1130  GLUCAP 203* 290* 130* 124* 295*   D-Dimer Recent Labs    03/08/20 0324 03/09/20 0325  DDIMER 0.82* 0.75*   Hgb A1c No results for input(s): HGBA1C in the last 72 hours. Lipid Profile No results for input(s): CHOL, HDL, LDLCALC, TRIG, CHOLHDL, LDLDIRECT in the last 72 hours. Thyroid function studies No results for input(s): TSH, T4TOTAL, T3FREE, THYROIDAB in the last 72 hours.  Invalid input(s): FREET3  Anemia work up Recent Labs    03/08/20 0324 03/09/20 0325  FERRITIN 708* 692*   Urinalysis    Component Value Date/Time   COLORURINE YELLOW 04/28/2015 0046   APPEARANCEUR CLEAR 04/28/2015 0046   LABSPEC 1.020 04/28/2015 0046   PHURINE 5.0 04/28/2015 0046   GLUCOSEU NEGATIVE 04/28/2015 0046   HGBUR NEGATIVE 04/28/2015 0046   BILIRUBINUR SMALL (A) 04/28/2015 0046   KETONESUR NEGATIVE 04/28/2015 0046   PROTEINUR NEGATIVE 04/28/2015 0046   NITRITE NEGATIVE 04/28/2015 0046   LEUKOCYTESUR NEGATIVE 04/28/2015 0046    Microbiology Recent Results (from the past 240 hour(s))  Blood Culture (routine x 2)     Status: None   Collection Time: 03/04/20 12:42 PM   Specimen: BLOOD  Result Value Ref Range Status   Specimen Description   Final    BLOOD LEFT ANTECUBITAL Performed at [facility], [address], [place], [place] [address]    Special Requests   Final    BOTTLES DRAWN AEROBIC AND ANAEROBIC Blood Culture adequate volume Performed at [facility], [address] [place], [place] [address]    Culture   Final    NO GROWTH 5 DAYS Performed at [facility], [address], [place], [place] [address]    Report Status 03/09/2020 FINAL  Final  Resp Panel by RT-PCR (Flu A&B, Covid) Nasopharyngeal Swab     Status: Abnormal   Collection Time: 03/04/20 12:48 PM   Specimen: Nasopharyngeal Swab; Nasopharyngeal(NP) swabs in vial transport medium  Result  Value Ref Range Status   SARS Coronavirus 2 by RT PCR POSITIVE (A) NEGATIVE Final    Comment: RESULT CALLED TO, READ BACK BY AND VERIFIED WITH: SAVOIE,B. RN @1421  03/04/20 BILLINGSLEY,L (NOTE) SARS-CoV-2 target nucleic acids are DETECTED.  The SARS-CoV-2 RNA is generally detectable in upper respiratory specimens during the acute phase of infection. Positive results are indicative of the presence of the identified virus, but do not rule out bacterial infection or co-infection with other pathogens not detected by the test. Clinical correlation with patient history and other diagnostic information is necessary to determine patient infection status. The expected result is Negative.  Fact Sheet for Patients: BloggerCourse.com  Fact Sheet for Healthcare Providers: SeriousBroker.it  This test is not yet approved or cleared by the [place] FDA and  has been authorized for detection and/or diagnosis of SARS-CoV-2 by FDA under an Emergency Use Authorization (EUA).  This EUA will remain in effect (meaning this tes t can be used) for the duration of  the COVID-19 declaration under Section 564(b)(1) of the Act, 21 U.S.C. section 360bbb-3(b)(1), unless the authorization is terminated or revoked sooner.     Influenza A by PCR NEGATIVE NEGATIVE Final   Influenza B by PCR NEGATIVE NEGATIVE Final    Comment: (NOTE) The Xpert Xpress SARS-CoV-2/FLU/RSV plus assay is intended as an aid in the diagnosis of influenza from Nasopharyngeal swab specimens and should not be used as a sole basis for treatment. Nasal washings and aspirates are unacceptable for Xpert Xpress SARS-CoV-2/FLU/RSV testing.  Fact Sheet for Patients: BloggerCourse.com  Fact Sheet for Healthcare Providers: SeriousBroker.it  This test is not yet approved or cleared by the [place] FDA and has been authorized for  detection and/or diagnosis of SARS-CoV-2 by FDA under an Emergency Use Authorization (EUA). This EUA will remain in effect (meaning this test can be used) for the duration of the COVID-19 declaration under Section 564(b)(1) of the Act, 21 U.S.C. section 360bbb-3(b)(1), unless  the authorization is terminated or revoked.  Performed at [facility], [address], [place], [place] [address]   Blood Culture (routine x 2)     Status: None   Collection Time: 03/04/20  2:25 PM   Specimen: Site Not Specified; Blood  Result Value Ref Range Status   Specimen Description   Final    SITE NOT SPECIFIED Performed at [facility], [address] [place], [place] [address]    Special Requests   Final    BOTTLES DRAWN AEROBIC AND ANAEROBIC Blood Culture adequate volume Performed at [facility], [address] [place], [place] [address]    Culture   Final    NO GROWTH 5 DAYS Performed at [facility], [address], [place], [place] [address]    Report Status 03/09/2020 FINAL  Final    Time coordinating discharge: Approximately 40 minutes  Tyrone Nine, MD  [facility] 03/09/2020, 6:24 PM

## 2020-03-27 ENCOUNTER — Encounter: Payer: Self-pay | Admitting: Cardiology

## 2020-03-27 ENCOUNTER — Other Ambulatory Visit: Payer: Self-pay

## 2020-03-27 ENCOUNTER — Ambulatory Visit: Payer: BLUE CROSS/BLUE SHIELD | Admitting: Cardiology

## 2020-03-27 VITALS — BP 132/56 | HR 93 | Ht 61.0 in | Wt 134.0 lb

## 2020-03-27 DIAGNOSIS — I420 Dilated cardiomyopathy: Secondary | ICD-10-CM

## 2020-03-27 DIAGNOSIS — I10 Essential (primary) hypertension: Secondary | ICD-10-CM

## 2020-03-27 DIAGNOSIS — I719 Aortic aneurysm of unspecified site, without rupture: Secondary | ICD-10-CM | POA: Diagnosis not present

## 2020-03-27 DIAGNOSIS — R0602 Shortness of breath: Secondary | ICD-10-CM

## 2020-03-27 DIAGNOSIS — I5042 Chronic combined systolic (congestive) and diastolic (congestive) heart failure: Secondary | ICD-10-CM

## 2020-03-27 DIAGNOSIS — I359 Nonrheumatic aortic valve disorder, unspecified: Secondary | ICD-10-CM

## 2020-03-27 NOTE — Progress Notes (Signed)
Date:  03/27/2020   ID:  Dale Sanford, DOB 12/23/1950, MRN [id number]   PCP:  Dale Nip, MD  Cardiologist:  Dale Him, MD  Electrophysiologist:  None   Chief Complaint:  CHF, HTN, AVR, DCM  History of Present Illness:    Dale Sanford is a 70 y.o. 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  2D echocardiogram 12/01/2015 showed mildly reduced LV systolic function with EF 40 to 45% with diffuse hypokinesis and grade 2 diastolic dysfunction.  He had a stable aortic valve bioprosthesis at that time and aortic root was mildly dilated at 39 mm.  Nuclear stress test for preoperative cardiac clearance showed an EF of 44% with no ischemia.   Echo 2/11/2020showed that LVEF decreased from 40-45% to 20-25% with moderate MR and moderate aortic regurgitation.His Losartan was stopped and started Entresto and spironolactone and underwent right and LHC which showed minimal luminal irregularities with no evidence of obstructive coronary artery disease.There was moderately to severely reduced LV systolic function with an EF of 30 to 35% with global hypokinesis. Right heart catheterization showed mildly elevated filling pressures, mild pulmonary hypertension and mildly reduced cardiac output. RA pressure: 7 mmHg, RV pressure 39 over 3 mmHg, pulmonary capillary wedge pressure: 15 mmHg, PA pressure: 39/11 with a mean of 24 mmHg. Cardiac output was 3.45 with an index of 2.16.He was also ordered to get nuclear imaging to assess for TTR amyloid but study on 05/31/18 was equivocal. He underwent cardiac MRI which which showed EF 40% with no evidence of cardiac amyloid and no late Gad enhancement to suggest infarct or infiltrative process.  Aortic root was dilated at 10mm.    He is here today for followup and is doing well.  He was hospitalized before Christmas with COVID 19 but has done  well.  He has some residual SOB and has O2 at home but has not had to take it.  He denies any chest pain or pressure,  PND, orthopnea, LE edema, dizziness, palpitations or syncope. He is compliant with his meds and is tolerating meds with no SE.     Prior CV studies:   The following studies were reviewed today:  2D echo 09/2019 IMPRESSIONS   1. Difficult acoustic windows limit study . There is mild inferior,  inferoseptal hypokinesis. . Left ventricular ejection fraction, by  estimation, is 45 to 50%. The left ventricle has mildly decreased  function. Left ventricular diastolic parameters are  consistent with Grade I diastolic dysfunction (impaired relaxation).  2. Right ventricular systolic function is normal. The right ventricular  size is normal. There is normal pulmonary artery systolic pressure.  3. Left atrial size was mildly dilated.  4. The mitral valve is normal in structure. Mild mitral valve  regurgitation.  5. A native pulmonic valve is present in aortic position. Opens well. .  The aortic valve has been repaired/replaced. Aortic valve regurgitation is  mild.  6. Pulmonic valve allograft present Opens well. Normal gradients.  7. S/p Ross procedure with ascending aorta replacement . Aortic  root/ascending aorta has been repaired/replaced. There is mild dilatation  of the ascending aorta measuring 40 mm.  8. The inferior vena cava is normal in size with greater than 50%  respiratory variability, suggesting right atrial pressure of 3 mmHg.   Cardiac MRI 09/2018 FINDINGS: 1. Normal left ventricular size. Mildly reduced systolic function, global  hypokinesis. Normal wall thickness.  There is no late gadolinium enhancement in the left ventricular myocardium.  Normal T1 myocardial nulling kinetics, suggests against a diagnosis of cardiac amyloidosis.  2. Normal right ventricular size, thickness. Mildly reduced systolic function. There are no regional wall motion  abnormalities.  3.  Moderate left atrial enlargement.  Mid right atrial enlargement.  4. Sinuses 36 x 37 x 37 mm in short axis of the aortic valve. In the sagittal oblique "candy cane aorta" view, aortic root measure approximately 40 mm in a transverse, non-double oblique measurement. Mid ascending aorta measures 30 mm in transverse measurement. Arch 26 mm, proximal descending aorta 24 mm, mid descending 23 mm.  5. Aortic and mitral regurgitation. Aortic regurgitation appears moderate. Mitral valve regurgitation is not well assessed.  6.  Normal pericardium.  No pericardial effusion.  Extracardiac findings:  Probable hepatic cyst 26 mm. Compared to CT Abdomen pelvis report 04/28/2015, this is likely a stable finding.  IMPRESSION: 1. Normal T1 myocardial nulling kinetics, suggests against a diagnosis of cardiac amyloidosis.  2. There is no late gadolinium enhancement in the left ventricular myocardium. No evidence of infarction, infiltrative process, or cardiomyopathic process.  3. Mildly dilated aortic root. Measured in a transverse plane, the aortic root measures 40 mm. This measurement correlates with echocardiographic measurements.  Past Medical History:  Diagnosis Date  . 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 DCM (dilated cardiomyopathy) (HCC) 09/13/2017   EF 20-25% by echo with normal cors on cath.  Cardiac MRI with EF 40% 09/2018.  . Dilated aortic root (HCC) 09/13/2017   54mm by cardiac MRI 09/2018  . GERD (gastroesophageal reflux disease)   . Hypertension   . Poor dental hygiene    Past Surgical History:  Procedure Laterality Date  . DESCENDING AORTIC resesection, replacement  12/1999   Hemashield Graft  . 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 RIGHT/LEFT  HEART CATH AND CORONARY ANGIOGRAPHY N/A 05/11/2018   Procedure: RIGHT/LEFT HEART CATH AND CORONARY ANGIOGRAPHY;  Surgeon: Iran Ouch, MD;  Location: [facility];  Service: Cardiovascular;  Laterality: N/A;  . TOTAL HIP ARTHROPLASTY Right 02/18/2016   Procedure: RIGHT TOTAL HIP ARTHROPLASTY ANTERIOR APPROACH;  Surgeon: Samson Frederic, MD;  Location: [facility];  Service: Orthopedics;  Laterality: Right;     Current Meds  Medication Sig  . aspirin EC 81 MG tablet Take 1 tablet (81 mg total) by mouth daily.  Marland Kitchen ENTRESTO 97-103 MG TAKE 1 TABLET BY MOUTH TWICE A DAY  . metoprolol (TOPROL-XL) 200 MG 24 hr tablet TAKE 1 TABLET BY MOUTH EVERY DAY     Allergies:   Nyquil multi-symptom [pseudoeph-doxylamine-dm-apap]   Social History   Tobacco Use  . Smoking status: Never Smoker  . Smokeless tobacco: Never Used  Vaping Use  . Vaping Use: Never used  Substance Use Topics  . Alcohol use: No    Comment: quit in 2008  . Drug use: No     Family Hx: The patient's family history includes CAD in his father; Cancer in his brother; Cirrhosis in his brother.  ROS:   Please see the history of present illness.     All other systems reviewed and are negative.   Labs/Other Tests and Data Reviewed:    Recent Labs: 03/04/2020: B Natriuretic Peptide  86.7 03/09/2020: ALT 52; BUN 15; Creatinine, Ser 0.83; Hemoglobin 12.9; Magnesium 1.8; Platelets 312; Potassium 3.7; Sodium 138   Recent Lipid Panel Lab Results  Component Value Date/Time   TRIG 67 03/04/2020 12:45 PM    Wt Readings from Last 3 Encounters:  03/27/20 134 lb (60.8 kg)  03/04/20 112 lb (50.8 kg)  05/14/19 139 lb 1.9 oz (63.1 kg)     Objective:    Vital Signs:  BP (!) 132/56   Pulse 93   Ht 5\' 1"  (1.549 m)   Wt 134 lb (60.8 kg)   SpO2 97%   BMI 25.32 kg/m    GEN: Well nourished, well developed in no acute distress HEENT: Normal NECK: No JVD; No carotid bruits LYMPHATICS: No lymphadenopathy CARDIAC:RRR, no  murmurs, rubs, gallops RESPIRATORY:  Clear to auscultation without rales, wheezing or rhonchi  ABDOMEN: Soft, non-tender, non-distended MUSCULOSKELETAL:  No edema; No deformity  SKIN: Warm and dry NEUROLOGIC:  Alert and oriented x 3 PSYCHIATRIC:  Normal affect    ASSESSMENT & PLAN:    1.  Nonischemic DCM  - cardiac cath 04/2018 showed normal coornary arteries.  - Cardiac MRI with no evidence of infiltrative disease or infarct.  EF was 40%.   -2D echo 09/2019 with EF 45-50% -he does not appear volume overloaded on exam today -Continue on BB and Entresto.    2.  AV disease  -s/p endocarditis complicated by AI s/p Ross procedure with  AVR autograft with Pulmonic tissue valve 2001.   -2D echo 09/2019 showed stable PV in the aortic position (mean gradient 70mmHg)with mild AR -normal function and stable bioprosthetic tissue PVR  3.  Descending aortic aneurysm  -s/p aortic resection with replacement with Hemashield graft in 2001.   -Cardiac MRI 09/2018 showed 48mm aortic root dilatation.   -2D echo 09/2019 with stable 82mm aortic root -Repeat 2D echo 09/2020 -check FLP and ALT  4.  Hypertension  -BP is well controlled on exam today -continue on BB and Entresto.   -SCr stable at 0.83 and K+ 3.7 in Dec 2021  5.  Combined systolic/diastolic CHF  -he does not appear volume overloaded on exam today and weight is stable from a year ago -he has had some DOE since COVID 19 and is SOB today but lungs are clear -I will check a 2D echo limited to eval LVF -continue on Entresto 97-103mg  BID and Toprol 200mg  daily.  He has not required any diuretic therapy.  -Cannot use spiro due to hx of hyperkalemia on it before.    Medication Adjustments/Labs and Tests Ordered: Current medicines are reviewed at length with the patient today.  Concerns regarding medicines are outlined above.  Tests Ordered: No orders of the defined types were placed in this encounter.  Medication Changes: No orders of the  defined types were placed in this encounter.   Disposition:  Follow up in 6 month(s) with PA and 1 year with me  Signed, Jan 2022, MD  03/27/2020 2:10 PM    [facility]

## 2020-03-27 NOTE — Addendum Note (Signed)
Addended by: Theresia Majors on: 03/27/2020 02:20 PM   Modules accepted: Orders

## 2020-03-27 NOTE — Patient Instructions (Addendum)
Medication Instructions:  Your physician recommends that you continue on your current medications as directed. Please refer to the Current Medication list given to you today.  *If you need a refill on your cardiac medications before your next appointment, please call your pharmacy*  Lab Work: Fasting lipids and ALT If you have labs (blood work) drawn today and your tests are completely normal, you will receive your results only by: Marland Kitchen MyChart Message (if you have MyChart) OR . A paper copy in the mail If you have any lab test that is abnormal or we need to change your treatment, we will call you to review the results.    Testing/Procedures: Your physician has requested that you have an echocardiogram. Echocardiography is a painless test that uses sound waves to create images of your heart. It provides your doctor with information about the size and shape of your heart and how well your heart's chambers and valves are working. This procedure takes approximately one hour. There are no restrictions for this procedure.   Follow-Up: At [facility], you and your health needs are our priority.  As part of our continuing mission to provide you with exceptional heart care, we have created designated Provider Care Teams.  These Care Teams include your primary Cardiologist (physician) and Advanced Practice Providers (APPs -  Physician Assistants and Nurse Practitioners) who all work together to provide you with the care you need, when you need it.  Your next appointment:   6 month(s)  The format for your next appointment:   In Person  Provider:   You will see one of the following Advanced Practice Providers on your designated Care Team:    Ronie Spies, PA-C  Jacolyn Reedy, PA-C  Then, Armanda Magic, MD will plan to see you again in 1 year(s).

## 2020-03-31 ENCOUNTER — Other Ambulatory Visit: Payer: Self-pay | Admitting: Cardiology

## 2020-04-01 ENCOUNTER — Telehealth: Payer: Self-pay | Admitting: Cardiology

## 2020-04-01 DIAGNOSIS — U099 Post covid-19 condition, unspecified: Secondary | ICD-10-CM | POA: Diagnosis not present

## 2020-04-01 DIAGNOSIS — I10 Essential (primary) hypertension: Secondary | ICD-10-CM | POA: Diagnosis not present

## 2020-04-01 DIAGNOSIS — R0600 Dyspnea, unspecified: Secondary | ICD-10-CM | POA: Diagnosis not present

## 2020-04-01 NOTE — Telephone Encounter (Signed)
Patient states he has been on short-term disability and he would like to know if Dr. Mayford Knife will clear him to return to work. He is also requesting written documentation stating whether or not he is eligible.  Please call.

## 2020-04-01 NOTE — Telephone Encounter (Signed)
Left message for patient to call back  

## 2020-04-02 NOTE — Telephone Encounter (Signed)
Patient returning Dale Sanford's call, will await a return call.

## 2020-04-02 NOTE — Telephone Encounter (Signed)
Spoke with the patient who states that he would like to return to work. 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 He would like to know if Dr. Mayford Knife would write a letter stating that he is able to return to work.

## 2020-04-02 NOTE — Telephone Encounter (Signed)
Who wrote for his short term disability.Marland Kitchendid we?

## 2020-04-02 NOTE — Telephone Encounter (Signed)
Left message for patient to call back.  Unsure who put him on short term disability. May have been after discharge from hospital 02/2020.

## 2020-04-03 NOTE — Telephone Encounter (Signed)
Spoke with the patient and advised that per Dr. Mayford Knife he can return to work. I have placed a letter up front for him to pick up.

## 2020-04-03 NOTE — Telephone Encounter (Signed)
Patient is stable from a cardiac standpoint to return to work

## 2020-04-03 NOTE — Telephone Encounter (Signed)
Patient states that he has not been back at work since he was discharged from the hospital. His work is requesting a note from his PCP and Cardiology stating that he is okay to return.

## 2020-04-09 ENCOUNTER — Telehealth: Payer: Self-pay | Admitting: *Deleted

## 2020-04-09 DIAGNOSIS — U071 COVID-19: Secondary | ICD-10-CM | POA: Diagnosis not present

## 2020-04-09 NOTE — Telephone Encounter (Signed)
This patient called and says that he went to pick up his entresto but was unable to afford it as it was several hundred dollars. He says that he had assistance in the past but he is unsure what happened with that. I made him aware that it is probably time to renew his application and provided him with the number to call [employer] and informed him that I would also send a message to see what else he needs to do at this time. He can be reached at [phone].  Thanks, MI

## 2020-04-10 NOTE — Telephone Encounter (Addendum)
**Note De-Identified K[place] Grima Obfuscation** No answer so I left a message on the pts home and cell phone VMs asking him to call Larita Fife at [phone] at Dr Malachy Mood office concerning his Sherryll Burger.

## 2020-04-10 NOTE — Telephone Encounter (Signed)
Patient is on Entresto, not Eliquis.  Was able to activate his $10 copay card RX BIN [id number] [facility]N [facility] GRP [id number] ID [id number]  Free 30 day offer  RX BIN [id number] PCN [facility] GRP [id number] ID [id number]  I called his pharmacy to give them the new information and they told me that the cost was $10 and he already picked it up. I gave her the information for the new card. Tired to call patient but mailbox full

## 2020-04-18 DIAGNOSIS — U071 COVID-19: Secondary | ICD-10-CM | POA: Diagnosis not present

## 2020-04-20 ENCOUNTER — Other Ambulatory Visit: Payer: BLUE CROSS/BLUE SHIELD | Admitting: *Deleted

## 2020-04-20 ENCOUNTER — Other Ambulatory Visit: Payer: Self-pay

## 2020-04-20 ENCOUNTER — Ambulatory Visit (HOSPITAL_COMMUNITY): Payer: BLUE CROSS/BLUE SHIELD | Attending: Cardiovascular Disease

## 2020-04-20 ENCOUNTER — Encounter: Payer: Self-pay | Admitting: Cardiology

## 2020-04-20 DIAGNOSIS — I10 Essential (primary) hypertension: Secondary | ICD-10-CM | POA: Diagnosis not present

## 2020-04-20 DIAGNOSIS — I359 Nonrheumatic aortic valve disorder, unspecified: Secondary | ICD-10-CM | POA: Diagnosis not present

## 2020-04-20 DIAGNOSIS — I420 Dilated cardiomyopathy: Secondary | ICD-10-CM

## 2020-04-20 DIAGNOSIS — I719 Aortic aneurysm of unspecified site, without rupture: Secondary | ICD-10-CM | POA: Diagnosis not present

## 2020-04-20 DIAGNOSIS — I5042 Chronic combined systolic (congestive) and diastolic (congestive) heart failure: Secondary | ICD-10-CM

## 2020-04-20 DIAGNOSIS — R0602 Shortness of breath: Secondary | ICD-10-CM | POA: Diagnosis not present

## 2020-04-20 LAB — ECHOCARDIOGRAM COMPLETE
AR max vel: 2.77 cm2
AV Area VTI: 3.02 cm2
AV Area mean vel: 2.9 cm2
AV Mean grad: 3 mmHg
AV Peak grad: 6.1 mmHg
Ao pk vel: 1.23 m/s
Area-P 1/2: 5.62 cm2
P 1/2 time: 382 msec
S' Lateral: 3.7 cm

## 2020-04-21 LAB — LIPID PANEL
Chol/HDL Ratio: 3.3 ratio (ref 0.0–5.0)
Cholesterol, Total: 126 mg/dL (ref 100–199)
HDL: 38 mg/dL — ABNORMAL LOW (ref >39)
LDL Chol Calc (NIH): 74 mg/dL (ref 0–99)
Triglycerides: 66 mg/dL (ref 0–149)
VLDL Cholesterol Cal: 14 mg/dL (ref 5–40)

## 2020-04-21 LAB — ALT: ALT: 10 IU/L (ref 0–44)

## 2020-05-10 DIAGNOSIS — U071 COVID-19: Secondary | ICD-10-CM | POA: Diagnosis not present

## 2020-05-18 DIAGNOSIS — U071 COVID-19: Secondary | ICD-10-CM | POA: Diagnosis not present

## 2020-05-28 ENCOUNTER — Other Ambulatory Visit: Payer: Self-pay | Admitting: Family Medicine

## 2020-05-28 ENCOUNTER — Ambulatory Visit
Admission: RE | Admit: 2020-05-28 | Discharge: 2020-05-28 | Disposition: A | Discharge status: Home / Self Care | Payer: BLUE CROSS/BLUE SHIELD | Source: Ambulatory Visit | Attending: Family Medicine | Admitting: Family Medicine

## 2020-05-28 DIAGNOSIS — R109 Unspecified abdominal pain: Secondary | ICD-10-CM

## 2020-05-28 DIAGNOSIS — R1011 Right upper quadrant pain: Secondary | ICD-10-CM | POA: Diagnosis not present

## 2020-06-02 ENCOUNTER — Other Ambulatory Visit: Payer: Self-pay | Admitting: Family Medicine

## 2020-06-03 ENCOUNTER — Other Ambulatory Visit: Payer: Self-pay | Admitting: Family Medicine

## 2020-06-03 DIAGNOSIS — R1011 Right upper quadrant pain: Secondary | ICD-10-CM

## 2020-06-03 DIAGNOSIS — D72829 Elevated white blood cell count, unspecified: Secondary | ICD-10-CM

## 2020-07-04 ENCOUNTER — Other Ambulatory Visit: Payer: Self-pay | Admitting: Family Medicine

## 2020-09-16 DIAGNOSIS — U071 COVID-19: Secondary | ICD-10-CM | POA: Diagnosis not present

## 2020-10-16 DIAGNOSIS — U071 COVID-19: Secondary | ICD-10-CM | POA: Diagnosis not present

## 2020-10-21 ENCOUNTER — Telehealth: Payer: Self-pay | Admitting: Cardiology

## 2020-10-21 DIAGNOSIS — I420 Dilated cardiomyopathy: Secondary | ICD-10-CM

## 2020-10-21 NOTE — Telephone Encounter (Signed)
New orders placed

## 2020-10-21 NOTE — Telephone Encounter (Signed)
Patient had to cancel his Echo for Nov 03, 2020. His mother passed away and that is the day of her funeral.  The patient needs new echo orders because the orders that the test was scheduled from expire Nov 03, 2020

## 2020-10-23 NOTE — Telephone Encounter (Signed)
PT states that he was told he was being rescheduled for the 15th and has taken off work to come in... the 15th is not available on the schedule and pt doesn't have an appt for the 15th.. please advise

## 2020-10-27 ENCOUNTER — Other Ambulatory Visit (HOSPITAL_COMMUNITY): Payer: BLUE CROSS/BLUE SHIELD

## 2020-11-11 ENCOUNTER — Other Ambulatory Visit (HOSPITAL_COMMUNITY): Payer: BLUE CROSS/BLUE SHIELD

## 2020-11-12 ENCOUNTER — Other Ambulatory Visit (HOSPITAL_COMMUNITY): Payer: BLUE CROSS/BLUE SHIELD

## 2020-11-16 DIAGNOSIS — U071 COVID-19: Secondary | ICD-10-CM | POA: Diagnosis not present

## 2020-11-30 ENCOUNTER — Other Ambulatory Visit: Payer: Self-pay

## 2020-11-30 ENCOUNTER — Ambulatory Visit (HOSPITAL_COMMUNITY): Payer: BLUE CROSS/BLUE SHIELD | Attending: Cardiology

## 2020-11-30 ENCOUNTER — Encounter: Payer: Self-pay | Admitting: Cardiology

## 2020-11-30 DIAGNOSIS — I420 Dilated cardiomyopathy: Secondary | ICD-10-CM | POA: Diagnosis not present

## 2020-11-30 LAB — ECHOCARDIOGRAM COMPLETE
AR max vel: 2.55 cm2
AV Area VTI: 2.42 cm2
AV Area mean vel: 2.36 cm2
AV Mean grad: 3.4 mmHg
AV Peak grad: 6.5 mmHg
Ao pk vel: 1.27 m/s
Area-P 1/2: 3.77 cm2
P 1/2 time: 491 msec
S' Lateral: 3.6 cm

## 2020-12-17 DIAGNOSIS — U071 COVID-19: Secondary | ICD-10-CM | POA: Diagnosis not present

## 2021-01-16 DIAGNOSIS — U071 COVID-19: Secondary | ICD-10-CM | POA: Diagnosis not present

## 2021-02-14 ENCOUNTER — Other Ambulatory Visit: Payer: Self-pay | Admitting: Cardiology

## 2021-03-04 ENCOUNTER — Other Ambulatory Visit: Payer: Self-pay | Admitting: Cardiology

## 2021-03-23 ENCOUNTER — Other Ambulatory Visit: Payer: Self-pay | Admitting: Cardiology

## 2021-04-09 ENCOUNTER — Other Ambulatory Visit: Payer: Self-pay | Admitting: Cardiology

## 2021-04-14 DIAGNOSIS — I429 Cardiomyopathy, unspecified: Secondary | ICD-10-CM | POA: Diagnosis not present

## 2021-04-14 DIAGNOSIS — Z23 Encounter for immunization: Secondary | ICD-10-CM | POA: Diagnosis not present

## 2021-04-14 DIAGNOSIS — Z0000 Encounter for general adult medical examination without abnormal findings: Secondary | ICD-10-CM | POA: Diagnosis not present

## 2021-04-14 DIAGNOSIS — I10 Essential (primary) hypertension: Secondary | ICD-10-CM | POA: Diagnosis not present

## 2021-04-18 ENCOUNTER — Other Ambulatory Visit: Payer: Self-pay | Admitting: Cardiology

## 2021-04-19 ENCOUNTER — Other Ambulatory Visit: Payer: Self-pay | Admitting: Cardiology

## 2021-04-28 ENCOUNTER — Other Ambulatory Visit: Payer: Self-pay | Admitting: Cardiology

## 2021-05-11 ENCOUNTER — Other Ambulatory Visit: Payer: Self-pay | Admitting: Cardiology

## 2021-05-11 MED ORDER — ENTRESTO 97-103 MG PO TABS: 1.0000 | ORAL_TABLET | Freq: Two times a day (BID) | ORAL | 0 refills | Status: DC

## 2021-05-18 ENCOUNTER — Other Ambulatory Visit: Payer: Self-pay | Admitting: Cardiology

## 2021-05-27 ENCOUNTER — Other Ambulatory Visit: Payer: Self-pay | Admitting: Cardiology

## 2021-05-28 ENCOUNTER — Other Ambulatory Visit: Payer: Self-pay | Admitting: Cardiology

## 2021-06-10 ENCOUNTER — Other Ambulatory Visit: Payer: Self-pay | Admitting: Cardiology

## 2021-06-22 ENCOUNTER — Other Ambulatory Visit: Payer: Self-pay | Admitting: Cardiology

## 2021-06-29 ENCOUNTER — Other Ambulatory Visit: Payer: Self-pay | Admitting: *Deleted

## 2021-06-29 ENCOUNTER — Telehealth: Payer: Self-pay | Admitting: Cardiology

## 2021-06-29 MED ORDER — METOPROLOL SUCCINATE ER 200 MG PO TB24: 200.0000 mg | ORAL_TABLET | Freq: Every day | ORAL | 0 refills | Status: DC

## 2021-06-29 MED ORDER — ENTRESTO 97-103 MG PO TABS: 1.0000 | ORAL_TABLET | Freq: Two times a day (BID) | ORAL | 0 refills | Status: DC

## 2021-06-29 NOTE — Telephone Encounter (Signed)
?*  STAT* If patient is at the pharmacy, call can be transferred to refill team. ? ? ?1. Which medications need to be refilled? (please list name of each medication and dose if known) Metoprolol and Entresto ? ?2. Which pharmacy/location (including street and city if local pharmacy) is medication to be sent to? [facility] [place] [place] ? ?3. Do they need a 30 day or 90 day supply? Need enough until his appointment on 08-11-21 ? ?

## 2021-06-30 ENCOUNTER — Telehealth: Payer: Self-pay

## 2021-06-30 NOTE — Telephone Encounter (Signed)
P/A submitted on CoverMyMeds for Entresto 97-103mg .  ?KEY: [id number] ? ?

## 2021-07-01 NOTE — Telephone Encounter (Signed)
[facility] has requested additional information. I have faxed them an Echo prior to [facility] with an EF of 30-35% and a current one on Entresto with an EF of 45-50%. ?

## 2021-07-02 NOTE — Telephone Encounter (Signed)
? ?  [facility] calling, she said they still need information about PA for entresto, she said, she going to fax it again today what they needed. ?

## 2021-07-06 NOTE — Telephone Encounter (Signed)
**Note De-Identified [place] Obfuscation** Entresto PA form received. ?I have completed the form and added the pts Echo results prior to starting Entresto and his most recent Echo results that shows improvement in his EF. ?I have emailed all to Dr Malachy Mood nurse so she can fax to [facility] at the fax number written on the cover letter included. ? ?

## 2021-07-07 ENCOUNTER — Other Ambulatory Visit: Payer: Self-pay

## 2021-07-07 ENCOUNTER — Telehealth: Payer: Self-pay | Admitting: Cardiology

## 2021-07-07 MED ORDER — ENTRESTO 97-103 MG PO TABS
1.0000 | ORAL_TABLET | Freq: Two times a day (BID) | ORAL | 0 refills | Status: DC
Start: 2021-07-07 — End: 2021-08-05

## 2021-07-07 MED ORDER — METOPROLOL SUCCINATE ER 200 MG PO TB24: 200.0000 mg | ORAL_TABLET | Freq: Every day | ORAL | 0 refills | Status: DC

## 2021-07-07 NOTE — Telephone Encounter (Signed)
Pt states that he thinks his pt assistance has ran out due to the fact that [facility] Sherryll Burger would be $700 vs $10 that he say's he's been paying. [facility] states [facility] is saying [facility]. Pt also said that he has only about a week of medication left. Please advise ?

## 2021-07-07 NOTE — Telephone Encounter (Signed)
Form was faxed to [facility] today.  ?Spoke with the patient and he is aware.  ?

## 2021-07-07 NOTE — Telephone Encounter (Signed)
Pt's medications were sent to pt's pharmacy as requested. Confirmation received.  

## 2021-07-07 NOTE — Telephone Encounter (Signed)
Form has been faxed and confirmation received

## 2021-07-08 ENCOUNTER — Other Ambulatory Visit: Payer: Self-pay | Admitting: Cardiology

## 2021-07-08 NOTE — Telephone Encounter (Signed)
**Note De-Identified Zonnie Landen Obfuscation** Since the pt is running out of his supply of Entresto 97-103 mg and we have not received a determination from [facility] yet I called them to check the progress of this PA with [place]. ? ?Per Para March they did receive a fax from [place] yesterday of the the pts Echo results from prior to him starting Entresto and his most recent Echo results which shows improvement in the pts EF. ? ?Para March offered to let me answer the [place] PA questions over the phone as she stated that is all they need to completed this PA now. ?I did answer all PA questions and per Para March they will be faxing [place] their determination within 24 to 72 hours. ? ?Para March also provided me with the pts correct [facility] ID #: [id number]. ? ? ?

## 2021-07-09 NOTE — Telephone Encounter (Signed)
**Note De-Identified [name] Obfuscation** Letter received from [facility] stating that they have approved the pts Entresto for coverage until 07/05/2024. ? ?I have notified the pt (Dale Sanford VM-no answer) and [id number] [place], [place] - [address] (Ph: [phone]) of this approval. ?

## 2021-08-05 ENCOUNTER — Other Ambulatory Visit: Payer: Self-pay | Admitting: Cardiology

## 2021-08-05 MED ORDER — ENTRESTO 97-103 MG PO TABS: 1.0000 | ORAL_TABLET | Freq: Two times a day (BID) | ORAL | 0 refills | Status: DC

## 2021-08-11 ENCOUNTER — Ambulatory Visit: Payer: BLUE CROSS/BLUE SHIELD | Admitting: Physician Assistant

## 2021-08-11 ENCOUNTER — Encounter: Payer: Self-pay | Admitting: Physician Assistant

## 2021-08-11 VITALS — BP 134/68 | HR 65 | Ht 61.0 in | Wt 142.8 lb

## 2021-08-11 DIAGNOSIS — I359 Nonrheumatic aortic valve disorder, unspecified: Secondary | ICD-10-CM | POA: Diagnosis not present

## 2021-08-11 DIAGNOSIS — I719 Aortic aneurysm of unspecified site, without rupture: Secondary | ICD-10-CM

## 2021-08-11 DIAGNOSIS — I420 Dilated cardiomyopathy: Secondary | ICD-10-CM | POA: Diagnosis not present

## 2021-08-11 DIAGNOSIS — I5042 Chronic combined systolic (congestive) and diastolic (congestive) heart failure: Secondary | ICD-10-CM

## 2021-08-11 MED ORDER — ENTRESTO 97-103 MG PO TABS: 1.0000 | ORAL_TABLET | Freq: Two times a day (BID) | ORAL | 11 refills | Status: DC

## 2021-08-11 MED ORDER — METOPROLOL SUCCINATE ER 200 MG PO TB24: 200.0000 mg | ORAL_TABLET | Freq: Every day | ORAL | 3 refills | Status: DC

## 2021-08-11 NOTE — Patient Instructions (Signed)
Medication Instructions:  Your physician recommends that you continue on your current medications as directed. Please refer to the Current Medication list given to you today.  *If you need a refill on your cardiac medications before your next appointment, please call your pharmacy*   Lab Work: NONE If you have labs (blood work) drawn today and your tests are completely normal, you will receive your results only by: MyChart Message (if you have MyChart) OR A paper copy in the mail If you have any lab test that is abnormal or we need to change your treatment, we will call you to review the results.   Testing/Procedures: NONE   Follow-Up: At [facility], you and your health needs are our priority.  As part of our continuing mission to provide you with exceptional heart care, we have created designated Provider Care Teams.  These Care Teams include your primary Cardiologist (physician) and Advanced Practice Providers (APPs -  Physician Assistants and Nurse Practitioners) who all work together to provide you with the care you need, when you need it.  We recommend signing up for the patient portal called "MyChart".  Sign up information is provided on this After Visit Summary.  MyChart is used to connect with patients for Virtual Visits (Telemedicine).  Patients are able to view lab/test results, encounter notes, upcoming appointments, etc.  Non-urgent messages can be sent to your provider as well.   To learn more about what you can do with MyChart, go to [email or web address].    Your next appointment:   1 year(s)  The format for your next appointment:   In Person  Provider:   [name], MD    Important Information About Sugar       

## 2021-08-11 NOTE — Progress Notes (Signed)
Cardiology Office Note:    Date:  08/11/2021   ID:  ATA ABBITT, DOB 1950-11-26, MRN [id number]  PCP:  Aurea Graff.Marlou Sa, MD  [facility] Cardiologist:  Fransico Him, MD  [place] Electrophysiologist:  None   Chief Complaint: yearly follow up   History of Present Illness:    Dale Sanford is a 71 y.o. 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   Echo 05/01/2018 showed that LVEF decreased from 40-45% to 20-25% with moderate MR and moderate aortic regurgitation.  His Losartan was stopped and started Entresto and spironolactone and underwent right and LHC which showed minimal luminal irregularities with no evidence of obstructive coronary artery disease. There was moderately to severely reduced LV systolic function with an EF of 30 to 35% with global hypokinesis.  Right heart catheterization showed mildly elevated filling pressures, mild pulmonary hypertension and mildly reduced cardiac output.  RA pressure: 7 mmHg, RV pressure 39 over 3 mmHg, pulmonary capillary wedge pressure: 15 mmHg, PA pressure: 39/11 with a mean of 24 mmHg.  Cardiac output was 3.45 with an index of 2.16. He was also ordered to get nuclear imaging to assess for TTR amyloid but study on 05/31/18 was equivocal. He underwent cardiac MRI which which showed EF 40% with no evidence of cardiac amyloid and no late Gad enhancement to suggest infarct or infiltrative process.  Aortic root was dilated at 68mm.   Hx of hyperkalemia on sprio.   Last echo 11/2020 with mildly reduced LVF with EF 45-50% with septal HK, G1DD, trivial MR, stable Ross procedure with native pulmonic valve in the aortic position with mild to moderate leakiness  and stable bioprosthetic PVR, mildly dilated aortic root at 36mm>stable from 03/2020  Here today for follow up.  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  He denies chest pain, shortness of breath, orthopnea, PND, syncope, lower extremity edema or melena.  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  Compliant with medication.  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  Mild to modetate AI of the AVR autograft by echo 11/2020   Avascular necrosis of hip, right ([place]) 02/18/2016   DCM (dilated cardiomyopathy) ([place]) 09/13/2017   EF 20-25% by echo with normal cors on cath.  Cardiac MRI with EF 40% 09/2018.   Dilated aortic root ([place]) 09/13/2017   69mm by cardiac MRI 09/2018   GERD (gastroesophageal reflux disease)    Hypertension    Poor dental hygiene     Past Surgical History:  Procedure Laterality Date   DESCENDING AORTIC resesection, replacement  12/1999   Hemashield Graft   ENDOVASCULAR REPAIR OF POPLITEAL ARTERY ANEURYSM  11/1999   infectious   RIGHT/LEFT HEART CATH AND CORONARY ANGIOGRAPHY N/A 05/11/2018   Procedure: RIGHT/LEFT HEART CATH AND CORONARY ANGIOGRAPHY;  Surgeon: Wellington Hampshire, MD;  Location: [facility];  Service: Cardiovascular;  Laterality: N/A;   TOTAL HIP ARTHROPLASTY Right 02/18/2016   Procedure: RIGHT TOTAL HIP ARTHROPLASTY ANTERIOR APPROACH;  Surgeon: Rod Can, MD;  Location: [facility];  Service: Orthopedics;  Laterality: Right;    Current Medications: Current Meds  Medication Sig   aspirin EC 81  MG tablet 1 tablet   [DISCONTINUED] aspirin EC 81 MG tablet Take 1 tablet (81 mg total) by mouth daily.   [DISCONTINUED] metoprolol (TOPROL-XL) 200 MG 24 hr tablet TAKE 1 TABLET BY MOUTH EVERY DAY   [DISCONTINUED] sacubitril-valsartan (ENTRESTO) 97-103 MG Take 1 tablet by mouth 2 (two) times daily.     Allergies:   Nyquil multi-symptom [pseudoeph-doxylamine-dm-apap]   Social History   Socioeconomic History    Marital status: Married    Spouse name: Not on file   Number of children: Not on file   Years of education: Not on file   Highest education level: Not on file  Occupational History   Not on file  Tobacco Use   Smoking status: Never   Smokeless tobacco: Never  Vaping Use   Vaping Use: Never used  Substance and Sexual Activity   Alcohol use: No    Comment: quit in 2008   Drug use: No   Sexual activity: Not on file  Other Topics Concern   Not on file  Social History Narrative   Not on file   Social Determinants of Health   Financial Resource Strain: Not on file  Food Insecurity: Not on file  Transportation Needs: Not on file  Physical Activity: Not on file  Stress: Not on file  Social Connections: Not on file     Family History: The patient's family history includes CAD in his father; Cancer in his brother; Cirrhosis in his brother.    ROS:   Please see the history of present illness.    All other systems reviewed and are negative.   EKGs/Labs/Other Studies Reviewed:    The following studies were reviewed today:  Echo 11/2020  1. Left ventricular ejection fraction, by estimation, is 45 to 50%. The  left ventricle has mildly decreased function. The left ventricle  demonstrates regional wall motion abnormalities with septal hypokinesis.  Left ventricular diastolic parameters are  consistent with Grade I diastolic dysfunction (impaired relaxation).   2. Right ventricular systolic function is normal. The right ventricular  size is normal.   3. The mitral valve is normal in structure. Trivial mitral valve  regurgitation. No evidence of mitral stenosis.   4. Status post Ross procedure with native pulmonic valve in the aortic  position. Mild-moderate aortic insufficiency. No significant stenosis.   5. Bioprosthetic pulmonary valve. No regurgitation. Peak gradient 12  mmHg. Normal function.   6. Aortic dilatation noted. There is mild dilatation of the aortic root,   measuring 38 mm.   7. The inferior vena cava is normal in size with greater than 50%  respiratory variability, suggesting right atrial pressure of 3 mmHg.   8. Left atrial size was mildly dilated.  CMRI 09/2018 IMPRESSION: 1. Normal T1 myocardial nulling kinetics, suggests against a diagnosis of cardiac amyloidosis.   2. There is no late gadolinium enhancement in the left ventricular myocardium. No evidence of infarction, infiltrative process, or cardiomyopathic process.   3. Mildly dilated aortic root. Measured in a transverse plane, the aortic root measures 40 mm. This measurement correlates with echocardiographic measurements.  RIGHT/LEFT HEART CATH AND CORONARY ANGIOGRAPHY 04/2018    There is moderate to severe left ventricular systolic dysfunction.   1.  Minimal luminal irregularities with no evidence of obstructive coronary artery disease. 2.  Moderately to severely reduced LV systolic function with an EF of 30 to 35% with global hypokinesis. 3.  Right heart catheterization showed mildly elevated filling pressures, mild pulmonary hypertension and mildly reduced  cardiac output.  RA pressure: 7 mmHg, RV pressure 39 over 3 mmHg, pulmonary capillary wedge pressure: 15 mmHg, PA pressure: 39/11 with a mean of 24 mmHg.  Cardiac output was 3.45 with an index of 2.16.   Recommendations: The patient has nonischemic cardiomyopathy for which I recommend medical therapy.  Consider switching losartan to Entresto and adding spironolactone.     EKG:  EKG is  ordered today.  The ekg ordered today demonstrates NSR, TWI in inferior anterior leads (same as 04/2019)  Recent Labs: No results found for requested labs within last 8760 hours.  Recent Lipid Panel    Component Value Date/Time   CHOL 126 04/20/2020 0912   TRIG 66 04/20/2020 0912   HDL 38 (L) 04/20/2020 0912   CHOLHDL 3.3 04/20/2020 0912   LDLCALC 74 04/20/2020 0912    Physical Exam:    VS:  BP 134/68   Pulse 65   Ht 5\' 1"   (1.549 m)   Wt 142 lb 12.8 oz (64.8 kg)   SpO2 95%   BMI 26.98 kg/m     Wt Readings from Last 3 Encounters:  08/11/21 142 lb 12.8 oz (64.8 kg)  03/27/20 134 lb (60.8 kg)  03/04/20 112 lb (50.8 kg)     GEN:  Well nourished, well developed in no acute distress HEENT: Normal NECK: No JVD; No carotid bruits LYMPHATICS: No lymphadenopathy CARDIAC: RRR, no murmurs, rubs, gallops RESPIRATORY:  Clear to auscultation without rales, wheezing or rhonchi  ABDOMEN: Soft, non-tender, non-distended MUSCULOSKELETAL:  No edema; No deformity  SKIN: Warm and dry NEUROLOGIC:  Alert and oriented x 3 PSYCHIATRIC:  Normal affect   ASSESSMENT AND PLAN:    Chronic combined CHF/NICM -Cardiac cath 04/2018 showed normal coornary arteries.  -Cardiac MRI with no evidence of infiltrative disease or infarct.  EF was 40%.   -2D echo 11/2020 with stable EF 45-50% -Continue BB and Entresto -Hx of hyperkalemia on spiro  2. HTN - BP stable on current medications.   3. AV disorder  s/p endocarditis complicated by AI s/p Ross procedure with  AVR autograft with Pulmonic tissue valve 2001.  - Stable by echo in 11/2020 with mild to moderate AI  4. Descending aortic aneurysm  -s/p aortic resection with replacement with Hemashield graft in 2001.   - Echo 11/2020: mild dilatation of the aortic root,  measuring 38 mm.    Reviewed lab work done on 03/2021. Scanned in system.   Medication Adjustments/Labs and Tests Ordered: Current medicines are reviewed at length with the patient today.  Concerns regarding medicines are outlined above.  Orders Placed This Encounter  Procedures   EKG 12-Lead   Meds ordered this encounter  Medications   sacubitril-valsartan (ENTRESTO) 97-103 MG    Sig: Take 1 tablet by mouth 2 (two) times daily.    Dispense:  60 tablet    Refill:  11    Pt must keep upcoming appt in May 2023 with Cardiologist before anymore refills. Thank you final Attempt   metoprolol (TOPROL-XL) 200 MG 24  hr tablet    Sig: Take 1 tablet (200 mg total) by mouth daily.    Dispense:  90 tablet    Refill:  3    Patient Instructions  Medication Instructions:  Your physician recommends that you continue on your current medications as directed. Please refer to the Current Medication list given to you today.  *If you need a refill on your cardiac medications before your next appointment, please call your pharmacy*  Lab Work: NONE If you have labs (blood work) drawn today and your tests are completely normal, you will receive your results only by: [place] (if you have MyChart) OR A paper copy in the mail If you have any lab test that is abnormal or we need to change your treatment, we will call you to review the results.   Testing/Procedures: NONE   Follow-Up: At [facility], you and your health needs are our priority.  As part of our continuing mission to provide you with exceptional heart care, we have created designated Provider Care Teams.  These Care Teams include your primary Cardiologist (physician) and Advanced Practice Providers (APPs -  Physician Assistants and Nurse Practitioners) who all work together to provide you with the care you need, when you need it.  We recommend signing up for the patient portal called "MyChart".  Sign up information is provided on this After Visit Summary.  MyChart is used to connect with patients for Virtual Visits (Telemedicine).  Patients are able to view lab/test results, encounter notes, upcoming appointments, etc.  Non-urgent messages can be sent to your provider as well.   To learn more about what you can do with MyChart, go to [email or web address].    Your next appointment:   1 year(s)  The format for your next appointment:   In Person  Provider:   Fransico Him, MD {    Important Information About Sugar         Jarrett Soho, [place]  08/11/2021 3:29 PM    [place]

## 2022-04-04 IMAGING — CT CT HEAD W/O CM
4 series · 16 of 47 positions shown, 18 images · non-contrast
Comparison: 11/19/1999 MRI head report.  11/17/1999 head CT report.

CLINICAL DATA: Head trauma, loss of consciousness

EXAM:
CT HEAD WITHOUT CONTRAST
TECHNIQUE: Contiguous axial images were obtained from the base of the skull
through the vertex without intravenous contrast.

[Series 3: head bone · axial · 0.43mm/px · z∈[-48,-18]mm · 3 of 77 slices shown]
[im 8/77  bone]
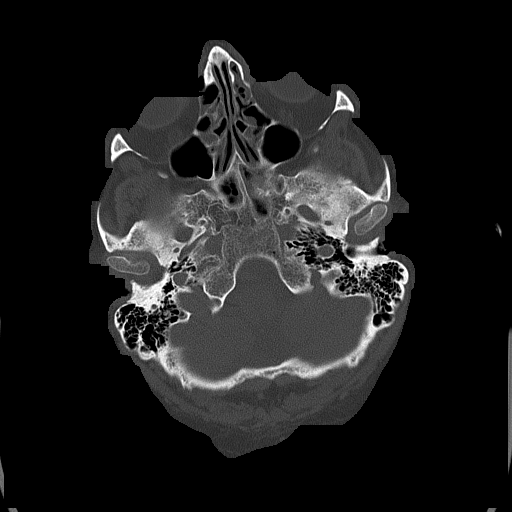
[im 16/77  bone]
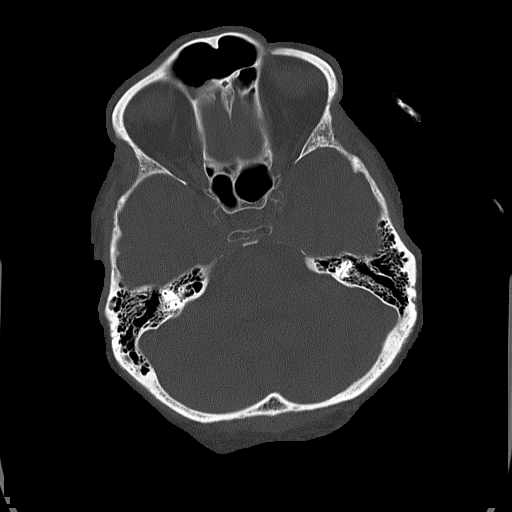
[im 23/77  bone]
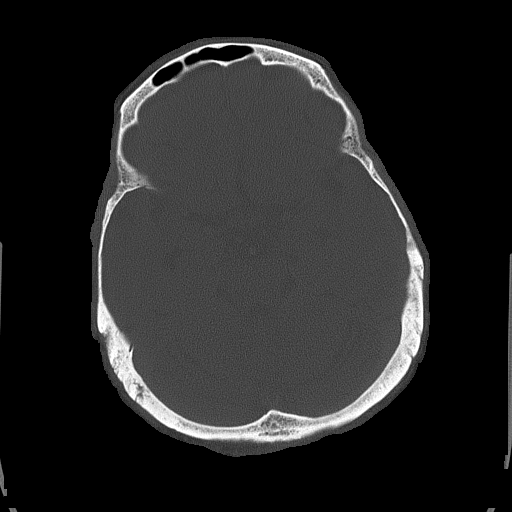

[Series 4: head without · axial · non-contrast · 0.43mm/px · z∈[-47,+68]mm · 7 of 31 slices shown, 9 images]
[im 4/31  brain]
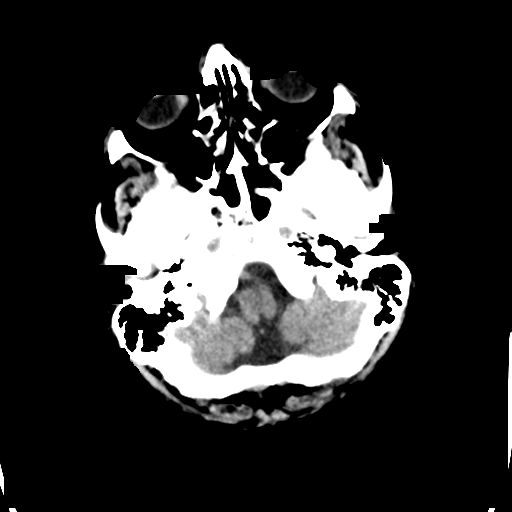
[im 4/31  bone]
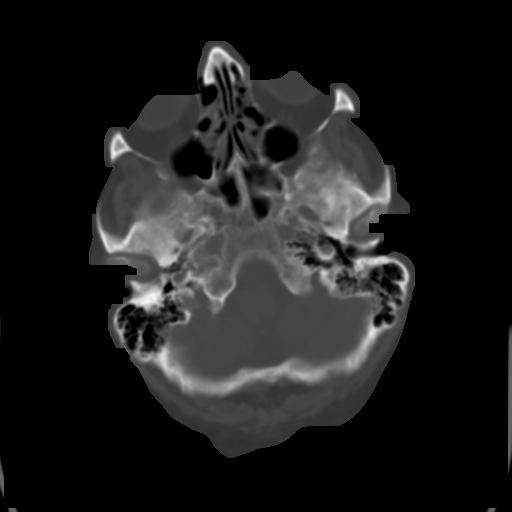
[im 8/31  brain]
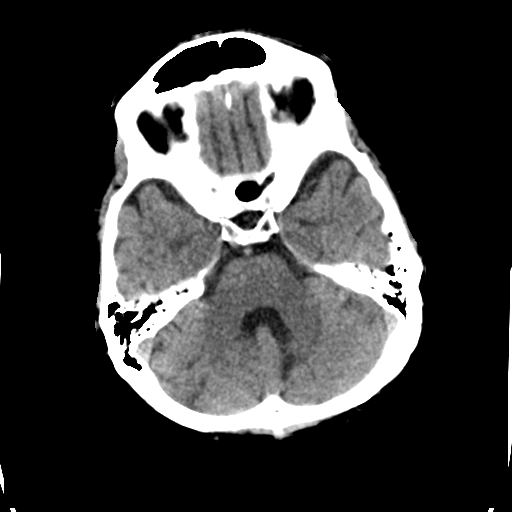
[im 12/31  brain]
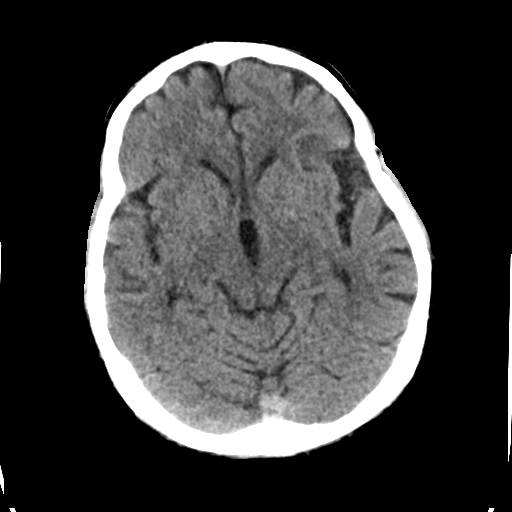
[im 16/31  brain]
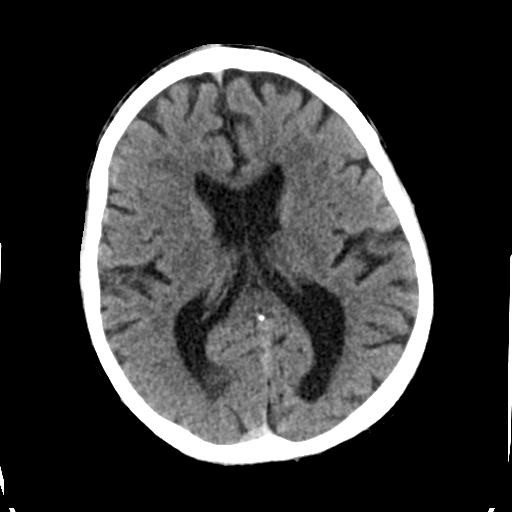
[im 19/31  brain]
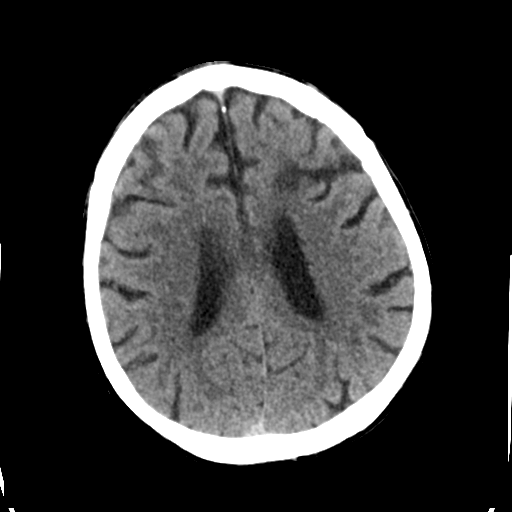
[im 19/31  bone]
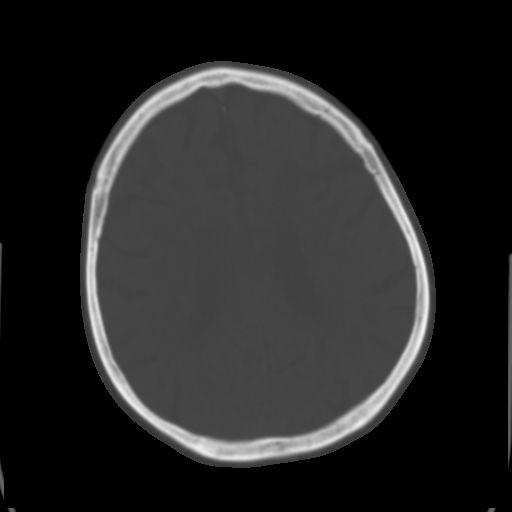
[im 23/31  brain]
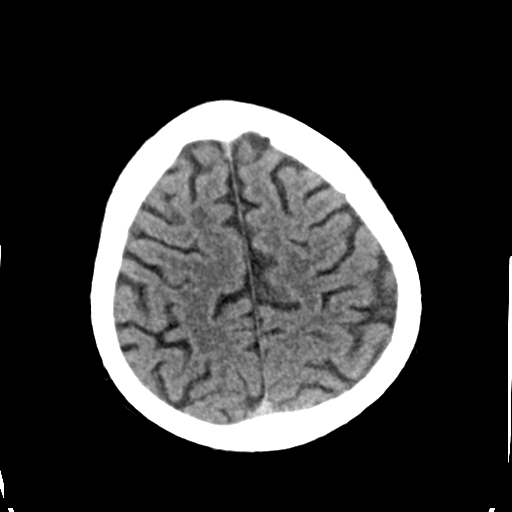
[im 27/31  brain]
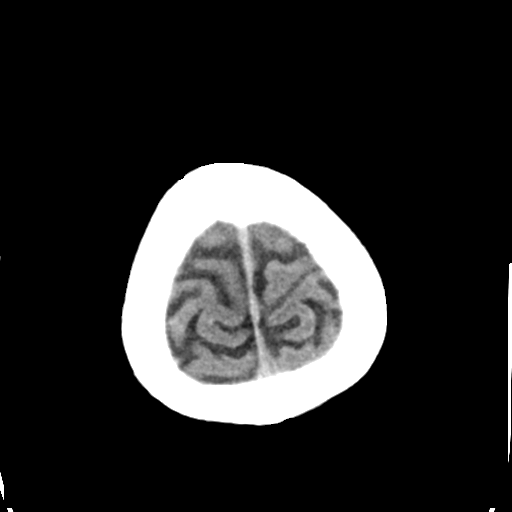

[Series 5: head without cor · coronal · non-contrast · 0.34mm/px · 3 of 66 slices shown]
[im 22/66  brain]
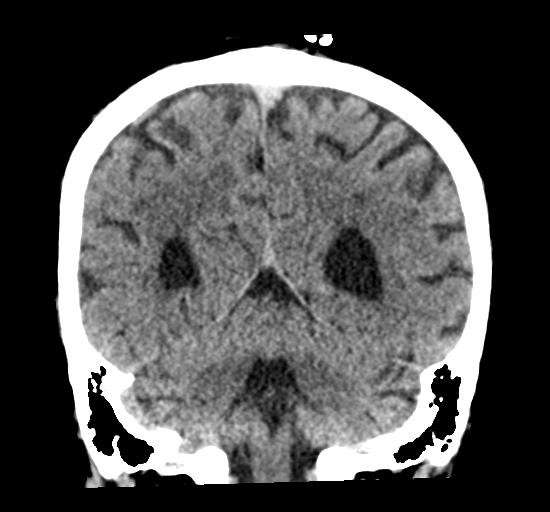
[im 29/66  brain]
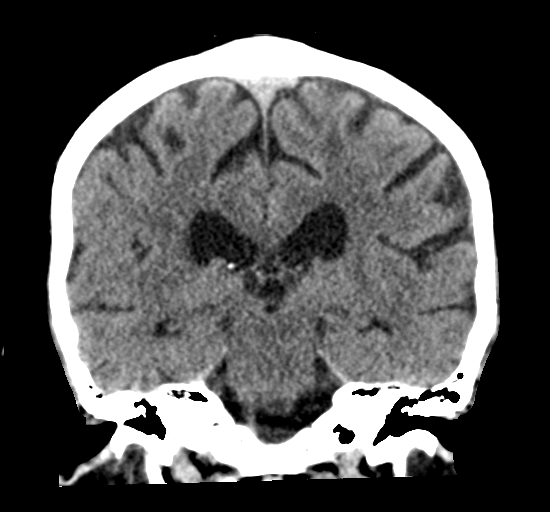
[im 37/66  brain]
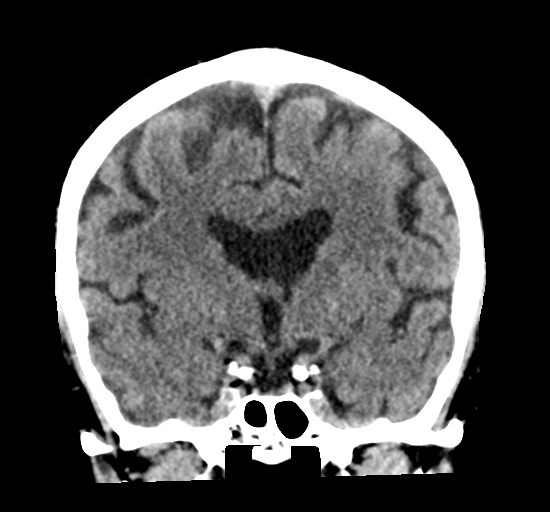

[Series 6: head without sag · sagittal · non-contrast · 0.33mm/px · 3 of 54 slices shown]
[im 18/54  brain]
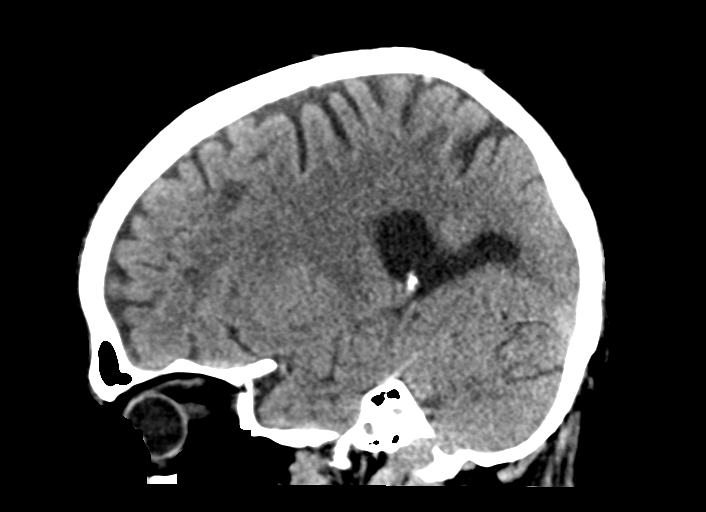
[im 27/54  brain]
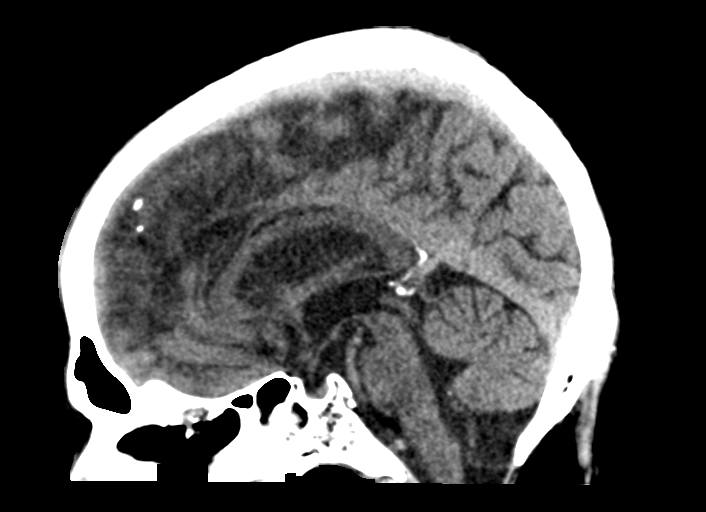
[im 36/54  brain]
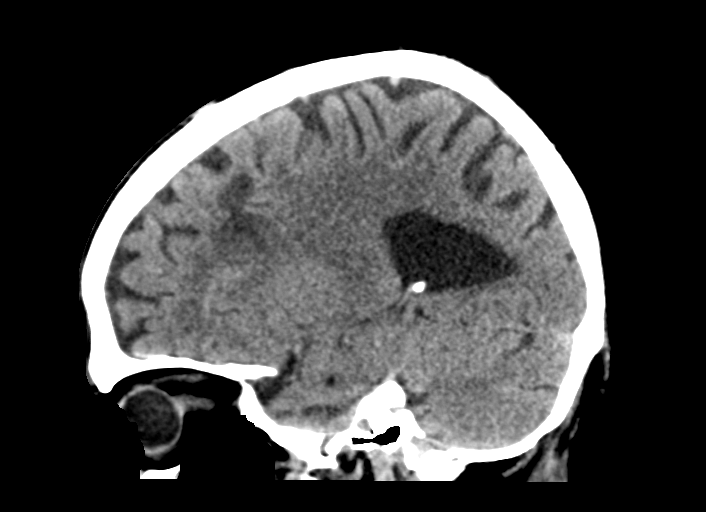

[16 of 47 positions shown; findings below may reference images not displayed]

FINDINGS: Brain: No acute infarct or intracranial hemorrhage. No mass lesion.
No midline shift, ventriculomegaly or extra-axial fluid collection.
Mild cerebral atrophy with ex vacuo dilatation. Sequela of prior
bilateral frontal, left parietal and right cerebellar insults.

Vascular: No hyperdense vessel or unexpected calcification.

Skull: Negative for fracture or focal lesion.

Sinuses/Orbits: Normal orbits. Mild ethmoid and sphenoid sinus
mucosal thickening. Clear mastoid air cells.

Other: Left parietal scalp soft tissue swelling and skin staples.
IMPRESSION: 1. No acute intracranial process.  Left parietal scalp laceration.
2. Remote bilateral frontal, left parietal and right cerebellar
insults.
3. Mild sinus disease.

## 2022-04-22 DIAGNOSIS — Z0000 Encounter for general adult medical examination without abnormal findings: Secondary | ICD-10-CM | POA: Diagnosis not present

## 2022-04-22 DIAGNOSIS — Z1211 Encounter for screening for malignant neoplasm of colon: Secondary | ICD-10-CM | POA: Diagnosis not present

## 2022-04-22 DIAGNOSIS — I429 Cardiomyopathy, unspecified: Secondary | ICD-10-CM | POA: Diagnosis not present

## 2022-04-22 DIAGNOSIS — Z1159 Encounter for screening for other viral diseases: Secondary | ICD-10-CM | POA: Diagnosis not present

## 2022-04-22 DIAGNOSIS — I10 Essential (primary) hypertension: Secondary | ICD-10-CM | POA: Diagnosis not present

## 2022-04-22 DIAGNOSIS — H9190 Unspecified hearing loss, unspecified ear: Secondary | ICD-10-CM | POA: Diagnosis not present

## 2022-04-27 DIAGNOSIS — H905 Unspecified sensorineural hearing loss: Secondary | ICD-10-CM | POA: Diagnosis not present

## 2022-07-19 DIAGNOSIS — H2513 Age-related nuclear cataract, bilateral: Secondary | ICD-10-CM | POA: Diagnosis not present

## 2022-07-19 DIAGNOSIS — H25013 Cortical age-related cataract, bilateral: Secondary | ICD-10-CM | POA: Diagnosis not present

## 2022-07-19 DIAGNOSIS — H18413 Arcus senilis, bilateral: Secondary | ICD-10-CM | POA: Diagnosis not present

## 2022-07-19 DIAGNOSIS — H2511 Age-related nuclear cataract, right eye: Secondary | ICD-10-CM | POA: Diagnosis not present

## 2022-07-19 DIAGNOSIS — H25043 Posterior subcapsular polar age-related cataract, bilateral: Secondary | ICD-10-CM | POA: Diagnosis not present

## 2022-08-17 NOTE — Progress Notes (Signed)
Cardiology Office Note:    Date:  08/30/2022   ID:  Dale Sanford, DOB 01/23/1951, MRN [id number]  PCP:  Asencion Gowda.August Saucer, MD  [facility] Providers Cardiologist:  Armanda Magic, MD     Referring MD: Clovis Riley, Elbert Ewings.August Saucer, MD   Chief Complaint:  Follow-up     History of Present Illness:   Dale Sanford is a 72 y.o. 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  His Losartan was stopped and started Entresto and spironolactone and underwent right and LHC which showed minimal luminal irregularities with no evidence of obstructive coronary artery disease. There was moderately to severely reduced LV systolic function with an EF of 30 to 35% with global hypokinesis.  Right heart catheterization showed mildly elevated filling pressures, mild pulmonary hypertension and mildly reduced cardiac output.  RA pressure: 7 mmHg, RV pressure 39 over 3 mmHg, pulmonary capillary wedge pressure: 15 mmHg, PA pressure: 39/11 with a mean of 24 mmHg.  Cardiac output was 3.45 with an index of 2.16. He was also ordered to get nuclear imaging to assess for TTR amyloid but study on 05/31/18 was equivocal. He underwent cardiac MRI which which showed EF 40% with no evidence of cardiac amyloid and no late Gad enhancement to suggest infarct or infiltrative process.  Aortic root was dilated at 40mm.   Hx of hyperkalemia on sprio.    Last echo 11/2020 with mildly reduced LVF with EF 45-50% with septal HK, G1DD, trivial MR, stable Ross procedure with native pulmonic valve in the aortic position with mild to moderate leakiness  and stable bioprosthetic PVR, mildly dilated aortic root at 3mm>stable from 03/2020.  Patient comes in for f/u. BP high, had a gravy  biscuit for breakfast. Hasn't checked his BP at home. Getting a lot of salt in his diet. No chest pain, palpitations, edema. 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 He still works doing maintenance. Tries to stay busy since his mother and brother died.      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  Cardiac MRI with EF 40% 09/2018.   Dilated aortic root ([facility]) 09/13/2017   40mm by cardiac MRI 09/2018   GERD (gastroesophageal reflux disease)    Hypertension    Poor dental hygiene    Current Medications: Current Meds  Medication Sig   aspirin EC 81 MG tablet 1 tablet   metoprolol (TOPROL-XL) 200 MG 24 hr tablet Take 1 tablet (200 mg total) by mouth daily.   sacubitril-valsartan (ENTRESTO) 97-103 MG Take 1 tablet by mouth 2 (two) times daily.    Allergies:   Nyquil multi-symptom [pseudoeph-doxylamine-dm-apap]   Social History   Tobacco Use   Smoking status: Never   Smokeless tobacco: Never  Vaping Use   Vaping Use: Never used  Substance Use Topics   Alcohol use: No  Comment: quit in 2008   Drug use: No    Family Hx: The patient's family history includes CAD in his father; Cancer in his brother; Cirrhosis in his brother.  ROS     Physical Exam:    VS:  BP (!) 150/60   Pulse 71   Ht 5\' 1"  (1.549 m)   Wt 146 lb 9.6 oz (66.5 kg)   SpO2 98%   BMI 27.70 kg/m     Wt Readings from Last 3 Encounters:  08/30/22 146 lb 9.6 oz (66.5 kg)  08/11/21 142 lb 12.8 oz (64.8 kg)  03/27/20 134 lb (60.8 kg)    Physical Exam  GEN: Well nourished, well developed, in no acute distress  Neck: no JVD, carotid bruits, or masses Cardiac:RRR; 2/6 systolic  murmur LSB Respiratory:  clear to auscultation bilaterally, normal work of breathing GI: soft, nontender, nondistended, + BS Ext: without cyanosis, clubbing, or edema, Good distal pulses bilaterally Neuro:  Alert and Oriented x 3,  Psych: euthymic mood, full affect        EKGs/Labs/Other Test Reviewed:    EKG:  EKG is   ordered today.  The ekg ordered today demonstrates NSR with LVH poor ant R wave progression.   Recent Labs: No results found for requested labs within last 365 days.   Recent Lipid Panel No results for input(s): "CHOL", "TRIG", "HDL", "VLDL", "LDLCALC", "LDLDIRECT" in the last 8760 hours.   Prior CV Studies:     Echo 11/2020  1. Left ventricular ejection fraction, by estimation, is 45 to 50%. The  left ventricle has mildly decreased function. The left ventricle  demonstrates regional wall motion abnormalities with septal hypokinesis.  Left ventricular diastolic parameters are  consistent with Grade I diastolic dysfunction (impaired relaxation).   2. Right ventricular systolic function is normal. The right ventricular  size is normal.   3. The mitral valve is normal in structure. Trivial mitral valve  regurgitation. No evidence of mitral stenosis.   4. Status post Ross procedure with native pulmonic valve in the aortic  position. Mild-moderate aortic insufficiency. No significant stenosis.   5. Bioprosthetic pulmonary valve. No regurgitation. Peak gradient 12  mmHg. Normal function.   6. Aortic dilatation noted. There is mild dilatation of the aortic root,  measuring 38 mm.   7. The inferior vena cava is normal in size with greater than 50%  respiratory variability, suggesting right atrial pressure of 3 mmHg.   8. Left atrial size was mildly dilated.   CMRI 09/2018 IMPRESSION: 1. Normal T1 myocardial nulling kinetics, suggests against a diagnosis of cardiac amyloidosis.   2. There is no late gadolinium enhancement in the left ventricular myocardium. No  evidence of infarction, infiltrative process, or cardiomyopathic process.   3. Mildly dilated aortic root. Measured in a transverse plane, the aortic root measures 40 mm. This measurement correlates with echocardiographic measurements.   RIGHT/LEFT HEART CATH AND CORONARY ANGIOGRAPHY 04/2018    There is moderate to severe left ventricular systolic dysfunction.   1.  Minimal luminal irregularities with no evidence of obstructive coronary artery disease. 2.  Moderately to severely reduced LV systolic function with an EF of 30 to 35% with global hypokinesis. 3.  Right heart catheterization showed mildly elevated filling pressures, mild pulmonary hypertension and mildly reduced cardiac output.  RA pressure: 7 mmHg, RV pressure 39 over 3 mmHg, pulmonary capillary wedge pressure: 15 mmHg, PA pressure: 39/11 with a mean of 24 mmHg.  Cardiac output was 3.45 with an  index of 2.16.   Recommendations: The patient has nonischemic cardiomyopathy for which I recommend medical therapy.  Consider switching losartan to Entresto and adding spironolactone.      Risk Assessment/Calculations/Metrics:         HYPERTENSION CONTROL Vitals:   08/30/22 0930 08/30/22 0959  BP: (!) 174/68 (!) 150/60    The patient's blood pressure is elevated above target today.  In order to address the patient's elevated BP: Blood pressure will be monitored at home to determine if medication changes need to be made.; Follow up with general cardiology has been recommended.       ASSESSMENT & PLAN:   No problem-specific Assessment & Plan notes found for this encounter.    Chronic combined CHF/NICM -Cardiac cath 04/2018 showed normal coornary arteries.  -Cardiac MRI with no evidence of infiltrative disease or infarct.  EF was 40%.   -2D echo 11/2020 with stable EF 45-50% -Continue BB and Entresto -Hx of hyperkalemia on spiro -compensated today.    HTN - BP elevated today but came down some on recheck. Eating a high  salt diet. He'll try to adjust to a 2 gm sodium diet and bring readings in for nurse visit check.     AV disorder  s/p endocarditis complicated by AI s/p Ross procedure with  AVR autograft with Pulmonic tissue valve 2001.  - Stable by echo in 11/2020 with mild to moderate AI -repeat echo    Descending aortic aneurysm  -s/p aortic resection with replacement with Hemashield graft in 2001.   - Echo 11/2020: mild dilatation of the aortic root,  measuring 38 mm.  -repeat echo             Dispo:  No follow-ups on file.   Medication Adjustments/Labs and Tests Ordered: Current medicines are reviewed at length with the patient today.  Concerns regarding medicines are outlined above.  Tests Ordered: Orders Placed This Encounter  Procedures   EKG 12-Lead   ECHOCARDIOGRAM COMPLETE   Medication Changes: No orders of the defined types were placed in this encounter.  Elson Clan, PA-C  08/30/2022 10:17 AM    [facility] [address] [place], [place], [place]  [address] Phone: [phone]; Fax: [phone]

## 2022-08-30 ENCOUNTER — Ambulatory Visit: Payer: Medicare Other | Attending: Physician Assistant | Admitting: Physician Assistant

## 2022-08-30 ENCOUNTER — Encounter: Payer: Self-pay | Admitting: Physician Assistant

## 2022-08-30 VITALS — BP 150/60 | HR 71 | Ht 61.0 in | Wt 146.6 lb

## 2022-08-30 DIAGNOSIS — I5042 Chronic combined systolic (congestive) and diastolic (congestive) heart failure: Secondary | ICD-10-CM

## 2022-08-30 DIAGNOSIS — I359 Nonrheumatic aortic valve disorder, unspecified: Secondary | ICD-10-CM | POA: Diagnosis not present

## 2022-08-30 DIAGNOSIS — I10 Essential (primary) hypertension: Secondary | ICD-10-CM | POA: Diagnosis not present

## 2022-08-30 DIAGNOSIS — I719 Aortic aneurysm of unspecified site, without rupture: Secondary | ICD-10-CM | POA: Diagnosis not present

## 2022-08-30 NOTE — Patient Instructions (Signed)
Medication Instructions:   FOR TWO WEEKS: TAKE BLOOD PRESSURE ONCE A DAY AND BRING TO NURSE VISIT  Your physician recommends that you continue on your current medications as directed. Please refer to the Current Medication list given to you today.  *If you need a refill on your cardiac medications before your next appointment, please call your pharmacy*   Lab Work: NONE ORDERED  TODAY   If you have labs (blood work) drawn today and your tests are completely normal, you will receive your results only by: MyChart Message (if you have MyChart) OR A paper copy in the mail If you have any lab test that is abnormal or we need to change your treatment, we will call you to review the results.   Testing/Procedures: Your physician has requested that you have an echocardiogram. Echocardiography is a painless test that uses sound waves to create images of your heart. It provides your doctor with information about the size and shape of your heart and how well your heart's chambers and valves are working. This procedure takes approximately one hour. There are no restrictions for this procedure. Please do NOT wear cologne, perfume, aftershave, or lotions (deodorant is allowed). Please arrive 15 minutes prior to your appointment time.    Follow-Up: At [facility], you and your health needs are our priority.  As part of our continuing mission to provide you with exceptional heart care, we have created designated Provider Care Teams.  These Care Teams include your primary Cardiologist (physician) and Advanced Practice Providers (APPs -  Physician Assistants and Nurse Practitioners) who all work together to provide you with the care you need, when you need it.  We recommend signing up for the patient portal called "MyChart".  Sign up information is provided on this After Visit Summary.  MyChart is used to connect with patients for Virtual Visits (Telemedicine).  Patients are able to view lab/test  results, encounter notes, upcoming appointments, etc.  Non-urgent messages can be sent to your provider as well.   To learn more about what you can do with MyChart, go to [email or web address].    Your next appointment:  NURSE VISIT BLOOD PRESSURE ( BRING BLOOD PRESSURE LOG)  1 year(s)  Provider:   Armanda Magic, MD     Other Instructions  Low-Sodium Eating Plan  Salt (sodium) helps you keep a healthy balance of fluids in your body. Too much sodium can raise your blood pressure. It can also cause fluid and waste to be held in your body. Your health care provider or dietitian may recommend a low-sodium eating plan if you have high blood pressure (hypertension), kidney disease, liver disease, or heart failure. Eating less sodium can help lower your blood pressure and reduce swelling. It can also protect your heart, liver, and kidneys. What are tips for following this plan? Reading food labels  Check food labels for the amount of sodium per serving. If you eat more than one serving, you must multiply the listed amount by the number of servings. Choose foods with less than 140 milligrams (mg) of sodium per serving. Avoid foods with 300 mg of sodium or more per serving. Always check how much sodium is in a product, even if the label says "unsalted" or "no salt added." Shopping  Buy products labeled as "low-sodium" or "no salt added." Buy fresh foods. Avoid canned foods and pre-made or frozen meals. Avoid canned, cured, or processed meats. Buy breads that have less than 80 mg of sodium per slice.  Cooking  Eat more home-cooked food. Try to eat less restaurant, buffet, and fast food. Try not to add salt when you cook. Use salt-free seasonings or herbs instead of table salt or sea salt. Check with your provider or pharmacist before using salt substitutes. Cook with plant-based oils, such as canola, sunflower, or olive oil. Meal planning When eating at a restaurant, ask if your food can be  made with less salt or no salt. Avoid dishes labeled as brined, pickled, cured, or smoked. Avoid dishes made with soy sauce, miso, or teriyaki sauce. Avoid foods that have monosodium glutamate (MSG) in them. MSG may be added to some restaurant food, sauces, soups, bouillon, and canned foods. Make meals that can be grilled, baked, poached, roasted, or steamed. These are often made with less sodium. General information Try to limit your sodium intake to 1,500-2,300 mg each day, or the amount told by your provider. What foods should I eat? Fruits Fresh, frozen, or canned fruit. Fruit juice. Vegetables Fresh or frozen vegetables. "No salt added" canned vegetables. "No salt added" tomato sauce and paste. Low-sodium or reduced-sodium tomato and vegetable juice. Grains Low-sodium cereals, such as oats, puffed wheat and rice, and shredded wheat. Low-sodium crackers. Unsalted rice. Unsalted pasta. Low-sodium bread. Whole grain breads and whole grain pasta. Meats and other proteins Fresh or frozen meat, poultry, seafood, and fish. These should have no added salt. Low-sodium canned tuna and salmon. Unsalted nuts. Dried peas, beans, and lentils without added salt. Unsalted canned beans. Eggs. Unsalted nut butters. Dairy Milk. Soy milk. Cheese that is naturally low in sodium, such as ricotta cheese, fresh mozzarella, or Swiss cheese. Low-sodium or reduced-sodium cheese. Cream cheese. Yogurt. Seasonings and condiments Fresh and dried herbs and spices. Salt-free seasonings. Low-sodium mustard and ketchup. Sodium-free salad dressing. Sodium-free light mayonnaise. Fresh or refrigerated horseradish. Lemon juice. Vinegar. Other foods Homemade, reduced-sodium, or low-sodium soups. Unsalted popcorn and pretzels. Low-salt or salt-free chips. The items listed above may not be all the foods and drinks you can have. Talk to a dietitian to learn more. What foods should I avoid? Vegetables Sauerkraut, pickled  vegetables, and relishes. Olives. [place] fries. Onion rings. Regular canned vegetables, except low-sodium or reduced-sodium items. Regular canned tomato sauce and paste. Regular tomato and vegetable juice. Frozen vegetables in sauces. Grains Instant hot cereals. Bread stuffing, pancake, and biscuit mixes. Croutons. Seasoned rice or pasta mixes. Noodle soup cups. Boxed or frozen macaroni and cheese. Regular salted crackers. Self-rising flour. Meats and other proteins Meat or fish that is salted, canned, smoked, spiced, or pickled. Precooked or cured meat, such as sausages or meat loaves. Tomasa Blase. Ham. Pepperoni. Hot dogs. Corned beef. Chipped beef. Salt pork. Jerky. Pickled herring, anchovies, and sardines. Regular canned tuna. Salted nuts. Dairy Processed cheese and cheese spreads. Hard cheeses. Cheese curds. Blue cheese. Feta cheese. String cheese. Regular cottage cheese. Buttermilk. Canned milk. Fats and oils Salted butter. Regular margarine. Ghee. Bacon fat. Seasonings and condiments Onion salt, garlic salt, seasoned salt, table salt, and sea salt. Canned and packaged gravies. Worcestershire sauce. Tartar sauce. Barbecue sauce. Teriyaki sauce. Soy sauce, including reduced-sodium soy sauce. Steak sauce. Fish sauce. Oyster sauce. Cocktail sauce. Horseradish that you find on the shelf. Regular ketchup and mustard. Meat flavorings and tenderizers. Bouillon cubes. Hot sauce. Pre-made or packaged marinades. Pre-made or packaged taco seasonings. Relishes. Regular salad dressings. Salsa. Other foods Salted popcorn and pretzels. Corn chips and puffs. Potato and tortilla chips. Canned or dried soups. Pizza. Frozen entrees and pot pies.  The items listed above may not be all the foods and drinks you should avoid. Talk to a dietitian to learn more. This information is not intended to replace advice given to you by your health care provider. Make sure you discuss any questions you have with your health care  provider. Document Revised: 03/24/2022 Document Reviewed: 03/24/2022 Elsevier Patient Education  2024 [employer].

## 2022-09-06 ENCOUNTER — Telehealth: Payer: Self-pay | Admitting: Cardiology

## 2022-09-06 MED ORDER — ENTRESTO 97-103 MG PO TABS: 1.0000 | ORAL_TABLET | Freq: Two times a day (BID) | ORAL | 3 refills | Status: DC

## 2022-09-06 MED ORDER — METOPROLOL SUCCINATE ER 200 MG PO TB24: 200.0000 mg | ORAL_TABLET | Freq: Every day | ORAL | 3 refills | Status: DC

## 2022-09-06 NOTE — Telephone Encounter (Signed)
*  STAT* If patient is at the pharmacy, call can be transferred to refill team.   1. Which medications need to be refilled? (please list name of each medication and dose if known)   metoprolol (TOPROL-XL) 200 MG 24 hr tablet Take 1 tablet (200 mg total) by mouth daily.   sacubitril-valsartan (ENTRESTO) 97-103 MG Take 1 tablet by mouth 2 (two) times daily.    2. Which pharmacy/location (including street and city if local pharmacy) is medication to be sent to?   [id number] - [place], [place] - [address]    3. Do they need a 30 day or 90 day supply? 90

## 2022-09-06 NOTE — Telephone Encounter (Signed)
Pt's medications were sent to pt's pharmacy as requested. Confirmation received.  

## 2022-09-13 ENCOUNTER — Ambulatory Visit: Payer: Medicare Other | Attending: Cardiology

## 2022-09-13 VITALS — BP 164/54 | HR 70 | Ht 62.0 in | Wt 145.0 lb

## 2022-09-13 DIAGNOSIS — I10 Essential (primary) hypertension: Secondary | ICD-10-CM | POA: Diagnosis not present

## 2022-09-13 MED ORDER — SPIRONOLACTONE 25 MG PO TABS: 25.0000 mg | ORAL_TABLET | Freq: Every day | ORAL | 3 refills | Status: DC

## 2022-09-13 NOTE — Patient Instructions (Signed)
Medication Instructions:  Your physician has recommended you make the following change in your medication:   Start spironolactone 25 mg one time daily   *If you need a refill on your cardiac medications before your next appointment, please call your pharmacy*   Lab Work: BMET  Wednesday July 3 after work  If you have labs (blood work) drawn today and your tests are completely normal, you will receive your results only by: [employer] (if you have MyChart) OR A paper copy in the mail If you have any lab test that is abnormal or we need to change your treatment, we will call you to review the results.   Follow-Up: At [facility], you and your health needs are our priority.  As part of our continuing mission to provide you with exceptional heart care, we have created designated Provider Care Teams.  These Care Teams include your primary Cardiologist (physician) and Advanced Practice Providers (APPs -  Physician Assistants and Nurse Practitioners) who all work together to provide you with the care you need, when you need it.   Your next appointment:   As directed   Provider:   Armanda Magic, MD

## 2022-09-13 NOTE — Progress Notes (Signed)
   Nurse Visit   Date of Encounter: 09/13/2022 ID: HAMZAH SAVOCA, DOB 09/18/1950, MRN [id number]  PCP:  Asencion Gowda.August Saucer, MD   [facility] Providers Cardiologist:  Armanda Magic, MD      Visit Details   VS:  BP (!) 164/54 (BP Location: Right Arm)   Pulse 70   Ht 5\' 2"  (1.575 m)   Wt 145 lb (65.8 kg)   SpO2 97%   BMI 26.52 kg/m  , BMI Body mass index is 26.52 kg/m.  Wt Readings from Last 3 Encounters:  09/13/22 145 lb (65.8 kg)  08/30/22 146 lb 9.6 oz (66.5 kg)  08/11/21 142 lb 12.8 oz (64.8 kg)     Reason for visit: BP recheck Performed today: Vitals, Provider consulted:Dr Skains (DOD) , and Education Changes (medications, testing, etc.) : start Spironolactone 25 mg daily. Check BMET in one week and bring a list of BP recordings from home  Length of Visit: 20 minutes    Medications Adjustments/Labs and Tests Ordered: Orders Placed This Encounter  Procedures   Basic metabolic panel   Meds ordered this encounter  Medications   spironolactone (ALDACTONE) 25 MG tablet    Sig: Take 1 tablet (25 mg total) by mouth daily.    Dispense:  90 tablet    Refill:  3     Signed, Eilleen Kempf, RN  09/13/2022 3:12 PM

## 2022-09-21 ENCOUNTER — Ambulatory Visit: Payer: Medicare Other | Attending: Internal Medicine

## 2022-09-21 DIAGNOSIS — I10 Essential (primary) hypertension: Secondary | ICD-10-CM

## 2022-09-22 LAB — BASIC METABOLIC PANEL
BUN/Creatinine Ratio: 15 (ref 10–24)
BUN: 21 mg/dL (ref 8–27)
CO2: 20 mmol/L (ref 20–29)
Calcium: 9 mg/dL (ref 8.6–10.2)
Chloride: 105 mmol/L (ref 96–106)
Creatinine, Ser: 1.38 mg/dL — ABNORMAL HIGH (ref 0.76–1.27)
Glucose: 105 mg/dL — ABNORMAL HIGH (ref 70–99)
Potassium: 3.8 mmol/L (ref 3.5–5.2)
Sodium: 143 mmol/L (ref 134–144)
eGFR: 54 mL/min/{1.73_m2} — ABNORMAL LOW (ref >59)

## 2022-09-26 ENCOUNTER — Encounter: Payer: Self-pay | Admitting: *Deleted

## 2022-10-17 DIAGNOSIS — H25811 Combined forms of age-related cataract, right eye: Secondary | ICD-10-CM | POA: Diagnosis not present

## 2022-10-17 DIAGNOSIS — H2511 Age-related nuclear cataract, right eye: Secondary | ICD-10-CM | POA: Diagnosis not present

## 2022-10-18 DIAGNOSIS — H25042 Posterior subcapsular polar age-related cataract, left eye: Secondary | ICD-10-CM | POA: Diagnosis not present

## 2022-10-18 DIAGNOSIS — H25012 Cortical age-related cataract, left eye: Secondary | ICD-10-CM | POA: Diagnosis not present

## 2022-10-18 DIAGNOSIS — H2512 Age-related nuclear cataract, left eye: Secondary | ICD-10-CM | POA: Diagnosis not present

## 2022-10-20 ENCOUNTER — Ambulatory Visit (HOSPITAL_COMMUNITY): Payer: Medicare Other | Attending: Physician Assistant

## 2022-10-20 DIAGNOSIS — I509 Heart failure, unspecified: Secondary | ICD-10-CM | POA: Insufficient documentation

## 2022-10-20 DIAGNOSIS — I719 Aortic aneurysm of unspecified site, without rupture: Secondary | ICD-10-CM

## 2022-10-20 DIAGNOSIS — I080 Rheumatic disorders of both mitral and aortic valves: Secondary | ICD-10-CM | POA: Insufficient documentation

## 2022-10-20 DIAGNOSIS — I110 Hypertensive heart disease with heart failure: Secondary | ICD-10-CM | POA: Insufficient documentation

## 2022-10-20 DIAGNOSIS — I340 Nonrheumatic mitral (valve) insufficiency: Secondary | ICD-10-CM

## 2022-10-20 DIAGNOSIS — I420 Dilated cardiomyopathy: Secondary | ICD-10-CM | POA: Insufficient documentation

## 2022-10-20 DIAGNOSIS — I7121 Aneurysm of the ascending aorta, without rupture: Secondary | ICD-10-CM | POA: Insufficient documentation

## 2022-10-20 LAB — ECHOCARDIOGRAM COMPLETE
AR max vel: 2.06 cm2
AV Area VTI: 2.46 cm2
AV Area mean vel: 2.72 cm2
AV Mean grad: 3 mmHg
AV Peak grad: 9.5 mmHg
Ao pk vel: 1.54 m/s
Area-P 1/2: 4.33 cm2
MV M vel: 4.37 m/s
MV Peak grad: 76.4 mmHg
P 1/2 time: 328 msec
Radius: 0.6 cm
S' Lateral: 3.2 cm

## 2022-10-24 DIAGNOSIS — H25811 Combined forms of age-related cataract, right eye: Secondary | ICD-10-CM | POA: Diagnosis not present

## 2022-11-07 DIAGNOSIS — H2512 Age-related nuclear cataract, left eye: Secondary | ICD-10-CM | POA: Diagnosis not present

## 2022-11-08 DIAGNOSIS — H25812 Combined forms of age-related cataract, left eye: Secondary | ICD-10-CM | POA: Diagnosis not present

## 2022-11-10 ENCOUNTER — Telehealth: Payer: Self-pay | Admitting: Cardiology

## 2022-11-10 NOTE — Telephone Encounter (Signed)
Pt c/o medication issue:  1. Name of Medication: sacubitril-valsartan (ENTRESTO) 97-103 MG   2. How are you currently taking this medication (dosage and times per day)? Take 1 tablet by mouth 2 (two) times daily.   3. Are you having a reaction (difficulty breathing--STAT)?   4. What is your medication issue? Patient called stating this medication will cost him $178 for a month supply.  He thinks his is currently in the donut hole.  He is wondering if there is a way he can get it cheaper.

## 2022-11-10 NOTE — Telephone Encounter (Signed)
Ok leaving pt a patient application for pt to pick up at [employer] front desk. FYI

## 2022-11-10 NOTE — Telephone Encounter (Signed)
Spoke with pt. Pt stated his entresto has increased from $47 a month to $178 a month. Pt picked up his prescription for the month and currently has a month supply but stated he can not keep that up and would like help. I gave him the 1800 number to entresto if he would like to get started on the patient assistance process and told him I would forward this over to our Pt assistance nurse for further assistance.

## 2022-11-10 NOTE — Telephone Encounter (Signed)
**Note De-Identified Dale Sanford Obfuscation** Per the pts request we are leaving him a NPAF application for Entresto assistance in the front office for him to pick up. He is aware that I will be calling him back tomorrow morning to assist him over the phone with completing his application.  He thanked me for calling him back and offering my assistance.

## 2022-11-11 ENCOUNTER — Telehealth: Payer: Self-pay | Admitting: Cardiology

## 2022-11-11 NOTE — Telephone Encounter (Signed)
**Note De-Identified Dale Sanford Obfuscation** I called the pt to assist him in filling out his NPAF application for [facility] assistance and he was at the office with his completed application. He did request that I go over it with him over the phone to be sure that he completed all of it correctly.  We went over the application and he stated that he was leaving it with the front office staff and I advised him that they would make sure that I get his application and that I will complete the providers page and that we will fax all to NPAF.  He is aware that the application stated that NPAF needs his proof of income so he advised me that if he is unable to bring it to the office today that he will on Monday.  He thanked me for my assistance.

## 2022-11-11 NOTE — Telephone Encounter (Signed)
Patient filled out assistance forms and these forms are in your folder on the countertop at the [employer].  Patient states he will be back by this afternoon or on Monday to drop off copies of the necessary income documents.  Thank you.

## 2022-11-15 NOTE — Telephone Encounter (Signed)
**Note De-Identified Garret Teale Obfuscation** See phone note from 8/23 for updates.

## 2022-11-15 NOTE — Telephone Encounter (Signed)
Pt's patient assistant application was scanned to [employer], [employer]. FYI

## 2022-11-15 NOTE — Telephone Encounter (Signed)
**Note De-Identified California Huberty Obfuscation** I have completed the providers page of the pts NPAF application for [facility] assistance and have e-mailed all to Dr Norris Cross nurse so she can obtain her signature, date it, and to fax to NPAF at the fax number written on the cover letter included.

## 2022-11-24 NOTE — Telephone Encounter (Signed)
**Note De-Identified Ilianna Bown Obfuscation** Letter received from NPAF stating that they need the pts proof of income. We included his POI with his application that we faxed to [employer]PA[employer] on 8/28.  I called [employer]PAF and s/w Loman Brooklyn who advised me that the pts address that I verbally provided to her was incorrect as they have a different address on the pts application that they received from [place] for the pt. And itis not [address]  I checked the pt application while on the phone and I advised Loman Brooklyn that I have the pts original application and that we defiantly (The pt and I) wrote his correct address on his application. Loman Brooklyn stated that a person on their end keyed in another address so she could not s/w me concerning the pts application.  I requested to s/w a supervisor because even though [place] finally found the pts application and it had his correct address on it she stated that she could not s/w me concerning the application.  Loman Brooklyn put me on hold several times and then finally came back and agreed to s/w me once I requested that she fax the pts application back to [place] so I can check for mistakes that they report is on his application.  Loman Brooklyn then stated that she has looked over the pts application and that his POI is there and his address is [address] [place], [place] [address].  Loman Brooklyn stated that she has forwarded the pts application to the processing team for a determination and they will be faxing [place] another letter once determination has been made. Pt ID: [id number]

## 2022-11-28 ENCOUNTER — Other Ambulatory Visit: Payer: Self-pay | Admitting: *Deleted

## 2022-11-28 MED ORDER — ENTRESTO 97-103 MG PO TABS: 1.0000 | ORAL_TABLET | Freq: Two times a day (BID) | ORAL | 3 refills | Status: DC

## 2023-07-21 ENCOUNTER — Other Ambulatory Visit: Payer: Self-pay | Admitting: Cardiology

## 2023-07-23 ENCOUNTER — Other Ambulatory Visit: Payer: Self-pay | Admitting: Cardiology

## 2023-08-14 ENCOUNTER — Other Ambulatory Visit: Payer: Self-pay | Admitting: Cardiology

## 2023-08-28 ENCOUNTER — Other Ambulatory Visit: Payer: Self-pay | Admitting: Cardiology

## 2023-09-04 ENCOUNTER — Ambulatory Visit: Payer: Medicare Other | Admitting: Cardiology

## 2023-09-22 ENCOUNTER — Other Ambulatory Visit: Payer: Self-pay | Admitting: Cardiology

## 2023-10-16 ENCOUNTER — Emergency Department (HOSPITAL_BASED_OUTPATIENT_CLINIC_OR_DEPARTMENT_OTHER)

## 2023-10-16 ENCOUNTER — Inpatient Hospital Stay (HOSPITAL_BASED_OUTPATIENT_CLINIC_OR_DEPARTMENT_OTHER)
Admission: EM | Admit: 2023-10-16 | Discharge: 2023-10-19 | DRG: 641 | Disposition: A | Discharge status: Home / Self Care | Attending: Internal Medicine | Admitting: Internal Medicine

## 2023-10-16 DIAGNOSIS — Z96641 Presence of right artificial hip joint: Secondary | ICD-10-CM | POA: Diagnosis present

## 2023-10-16 DIAGNOSIS — N1832 Chronic kidney disease, stage 3b: Secondary | ICD-10-CM | POA: Diagnosis present

## 2023-10-16 DIAGNOSIS — F32A Depression, unspecified: Secondary | ICD-10-CM | POA: Diagnosis present

## 2023-10-16 DIAGNOSIS — Z952 Presence of prosthetic heart valve: Secondary | ICD-10-CM

## 2023-10-16 DIAGNOSIS — Z8679 Personal history of other diseases of the circulatory system: Secondary | ICD-10-CM

## 2023-10-16 DIAGNOSIS — E8729 Other acidosis: Secondary | ICD-10-CM | POA: Diagnosis present

## 2023-10-16 DIAGNOSIS — E872 Acidosis, unspecified: Secondary | ICD-10-CM | POA: Diagnosis present

## 2023-10-16 DIAGNOSIS — Z8249 Family history of ischemic heart disease and other diseases of the circulatory system: Secondary | ICD-10-CM

## 2023-10-16 DIAGNOSIS — I5022 Chronic systolic (congestive) heart failure: Secondary | ICD-10-CM | POA: Diagnosis present

## 2023-10-16 DIAGNOSIS — I130 Hypertensive heart and chronic kidney disease with heart failure and stage 1 through stage 4 chronic kidney disease, or unspecified chronic kidney disease: Secondary | ICD-10-CM | POA: Diagnosis present

## 2023-10-16 DIAGNOSIS — Z888 Allergy status to other drugs, medicaments and biological substances status: Secondary | ICD-10-CM

## 2023-10-16 DIAGNOSIS — Z79899 Other long term (current) drug therapy: Secondary | ICD-10-CM

## 2023-10-16 DIAGNOSIS — N179 Acute kidney failure, unspecified: Secondary | ICD-10-CM | POA: Diagnosis present

## 2023-10-16 DIAGNOSIS — D72829 Elevated white blood cell count, unspecified: Secondary | ICD-10-CM | POA: Diagnosis present

## 2023-10-16 DIAGNOSIS — I959 Hypotension, unspecified: Secondary | ICD-10-CM | POA: Diagnosis present

## 2023-10-16 DIAGNOSIS — E875 Hyperkalemia: Principal | ICD-10-CM | POA: Diagnosis present

## 2023-10-16 DIAGNOSIS — I440 Atrioventricular block, first degree: Secondary | ICD-10-CM | POA: Diagnosis present

## 2023-10-16 DIAGNOSIS — R197 Diarrhea, unspecified: Secondary | ICD-10-CM

## 2023-10-16 DIAGNOSIS — I420 Dilated cardiomyopathy: Secondary | ICD-10-CM | POA: Diagnosis present

## 2023-10-16 DIAGNOSIS — R55 Syncope and collapse: Secondary | ICD-10-CM

## 2023-10-16 DIAGNOSIS — K219 Gastro-esophageal reflux disease without esophagitis: Secondary | ICD-10-CM | POA: Diagnosis present

## 2023-10-16 DIAGNOSIS — T465X5A Adverse effect of other antihypertensive drugs, initial encounter: Secondary | ICD-10-CM | POA: Diagnosis present

## 2023-10-16 DIAGNOSIS — I5042 Chronic combined systolic (congestive) and diastolic (congestive) heart failure: Secondary | ICD-10-CM | POA: Diagnosis present

## 2023-10-16 DIAGNOSIS — Z7982 Long term (current) use of aspirin: Secondary | ICD-10-CM

## 2023-10-16 DIAGNOSIS — T500X5A Adverse effect of mineralocorticoids and their antagonists, initial encounter: Secondary | ICD-10-CM | POA: Diagnosis present

## 2023-10-16 DIAGNOSIS — N189 Chronic kidney disease, unspecified: Secondary | ICD-10-CM | POA: Diagnosis present

## 2023-10-16 LAB — CBC WITH DIFFERENTIAL/PLATELET
Abs Immature Granulocytes: 0.12 K/uL — ABNORMAL HIGH (ref 0.00–0.07)
Basophils Absolute: 0 K/uL (ref 0.0–0.1)
Basophils Relative: 0 %
Eosinophils Absolute: 0 K/uL (ref 0.0–0.5)
Eosinophils Relative: 0 %
HCT: 39.1 % (ref 39.0–52.0)
Hemoglobin: 13.4 g/dL (ref 13.0–17.0)
Immature Granulocytes: 1 %
Lymphocytes Relative: 9 %
Lymphs Abs: 1.9 K/uL (ref 0.7–4.0)
MCH: 29.8 pg (ref 26.0–34.0)
MCHC: 34.3 g/dL (ref 30.0–36.0)
MCV: 86.9 fL (ref 80.0–100.0)
Monocytes Absolute: 0.8 K/uL (ref 0.1–1.0)
Monocytes Relative: 4 %
Neutro Abs: 19.1 K/uL — ABNORMAL HIGH (ref 1.7–7.7)
Neutrophils Relative %: 86 %
Platelets: 355 K/uL (ref 150–400)
RBC: 4.5 MIL/uL (ref 4.22–5.81)
RDW: 13.2 % (ref 11.5–15.5)
WBC: 22 K/uL — ABNORMAL HIGH (ref 4.0–10.5)
nRBC: 0 % (ref 0.0–0.2)

## 2023-10-16 LAB — BASIC METABOLIC PANEL WITH GFR
Anion gap: 15 (ref 5–15)
BUN: 79 mg/dL — ABNORMAL HIGH (ref 8–23)
CO2: 14 mmol/L — ABNORMAL LOW (ref 22–32)
Calcium: 9.3 mg/dL (ref 8.9–10.3)
Chloride: 105 mmol/L (ref 98–111)
Creatinine, Ser: 3.21 mg/dL — ABNORMAL HIGH (ref 0.61–1.24)
GFR, Estimated: 19 mL/min — ABNORMAL LOW
Glucose, Bld: 120 mg/dL — ABNORMAL HIGH (ref 70–99)
Potassium: >7.5 mmol/L (ref 3.5–5.1)
Sodium: 134 mmol/L — ABNORMAL LOW (ref 135–145)

## 2023-10-16 LAB — COMPREHENSIVE METABOLIC PANEL WITH GFR
ALT: 22 U/L (ref 0–44)
AST: 19 U/L (ref 15–41)
Albumin: 4.6 g/dL (ref 3.5–5.0)
Alkaline Phosphatase: 68 U/L (ref 38–126)
Anion gap: 17 — ABNORMAL HIGH (ref 5–15)
BUN: 74 mg/dL — ABNORMAL HIGH (ref 8–23)
CO2: 14 mmol/L — ABNORMAL LOW (ref 22–32)
Calcium: 9.8 mg/dL (ref 8.9–10.3)
Chloride: 105 mmol/L (ref 98–111)
Creatinine, Ser: 3.31 mg/dL — ABNORMAL HIGH (ref 0.61–1.24)
GFR, Estimated: 19 mL/min — ABNORMAL LOW (ref >60)
Glucose, Bld: 134 mg/dL — ABNORMAL HIGH (ref 70–99)
Potassium: >7.5 mmol/L (ref 3.5–5.1)
Sodium: 135 mmol/L (ref 135–145)
Total Bilirubin: 0.4 mg/dL (ref 0.0–1.2)
Total Protein: 7.8 g/dL (ref 6.5–8.1)

## 2023-10-16 LAB — CBG MONITORING, ED: Glucose-Capillary: 259 mg/dL — ABNORMAL HIGH (ref 70–99)

## 2023-10-16 LAB — TROPONIN T, HIGH SENSITIVITY
Troponin T High Sensitivity: 26 ng/L — ABNORMAL HIGH (ref <19)
Troponin T High Sensitivity: 23 ng/L — ABNORMAL HIGH (ref <19)

## 2023-10-16 LAB — LACTIC ACID, PLASMA
Lactic Acid, Venous: 2.6 mmol/L (ref 0.5–1.9)
Lactic Acid, Venous: 1.7 mmol/L (ref 0.5–1.9)

## 2023-10-16 LAB — CK: Total CK: 91 U/L (ref 49–397)

## 2023-10-16 LAB — MAGNESIUM: Magnesium: 1.8 mg/dL (ref 1.7–2.4)

## 2023-10-16 MED ORDER — LACTATED RINGERS IV BOLUS
1000.0000 mL | Freq: Once | INTRAVENOUS | Status: AC
  Administered 2023-10-16: 1000 mL via INTRAVENOUS

## 2023-10-16 MED ORDER — SODIUM BICARBONATE 8.4 % IV SOLN
50.0000 meq | Freq: Once | INTRAVENOUS | Status: AC
  Administered 2023-10-16: 50 meq via INTRAVENOUS
  Filled 2023-10-16: qty 50

## 2023-10-16 MED ORDER — CALCIUM GLUCONATE-NACL 1-0.675 GM/50ML-% IV SOLN
1.0000 g | Freq: Once | INTRAVENOUS | Status: AC
  Administered 2023-10-16: 1000 mg via INTRAVENOUS
  Filled 2023-10-16: qty 50

## 2023-10-16 MED ORDER — ONDANSETRON HCL 4 MG/2ML IJ SOLN
4.0000 mg | Freq: Once | INTRAMUSCULAR | Status: AC
  Administered 2023-10-16: 4 mg via INTRAVENOUS
  Filled 2023-10-16: qty 2

## 2023-10-16 MED ORDER — DEXTROSE 50 % IV SOLN
1.0000 | Freq: Once | INTRAVENOUS | Status: AC
  Administered 2023-10-16: 50 mL via INTRAVENOUS
  Filled 2023-10-16: qty 50

## 2023-10-16 MED ORDER — LACTATED RINGERS IV SOLN: INTRAVENOUS | Status: AC

## 2023-10-16 MED ORDER — SODIUM CHLORIDE 0.9 % IV SOLN
2.0000 g | Freq: Once | INTRAVENOUS | Status: AC
  Administered 2023-10-16: 2 g via INTRAVENOUS
  Filled 2023-10-16: qty 20

## 2023-10-16 MED ORDER — FUROSEMIDE 10 MG/ML IJ SOLN
80.0000 mg | Freq: Once | INTRAMUSCULAR | Status: AC
  Administered 2023-10-16: 80 mg via INTRAVENOUS
  Filled 2023-10-16: qty 8

## 2023-10-16 MED ORDER — METRONIDAZOLE 500 MG/100ML IV SOLN
500.0000 mg | Freq: Once | INTRAVENOUS | Status: AC
  Administered 2023-10-17: 500 mg via INTRAVENOUS
  Filled 2023-10-16: qty 100

## 2023-10-16 MED ORDER — INSULIN ASPART 100 UNIT/ML IV SOLN
5.0000 [IU] | Freq: Once | INTRAVENOUS | Status: AC
  Administered 2023-10-16: 5 [IU] via INTRAVENOUS

## 2023-10-16 MED ORDER — ALBUTEROL SULFATE (2.5 MG/3ML) 0.083% IN NEBU
10.0000 mg | INHALATION_SOLUTION | Freq: Once | RESPIRATORY_TRACT | Status: AC
  Administered 2023-10-16: 10 mg via RESPIRATORY_TRACT
  Filled 2023-10-16: qty 12

## 2023-10-16 MED ORDER — SODIUM ZIRCONIUM CYCLOSILICATE 10 G PO PACK
10.0000 g | PACK | Freq: Once | ORAL | Status: AC
  Administered 2023-10-16: 10 g via ORAL
  Filled 2023-10-16: qty 1

## 2023-10-16 NOTE — ED Notes (Signed)
 Pt not able to provide urine or stool sample at this time. Instructed to press call bell when ready.

## 2023-10-16 NOTE — ED Provider Notes (Signed)
 Care assumed from previous provider.  See note for full HPI.  In summation 73 year old here with near syncopal episode.  Has been having few days of watery diarrhea.  Patient with significant lab abnormalities.  Plan on scanning CT chest abdomen and pelvis also repeating metabolic panel.  Appears writer spoke with nephrology who recommended temporizing measures for hyperkalemia and can follow-up in the morning.  Does not need emergent dialysis tonight.  Patient agreeable for admission. Abx coverage for intra-abdominal source given his white count of 22 and lactic acid 2.6. Physical Exam  BP (!) 138/40   Pulse 82   Temp 98.4 F (36.9 C) (Oral)   Resp (!) 23   SpO2 100%   Physical Exam Vitals and nursing note reviewed.  Constitutional:      General: He is not in acute distress.    Appearance: He is well-developed. He is not ill-appearing or diaphoretic.  HENT:     Head: Atraumatic.  Eyes:     Pupils: Pupils are equal, round, and reactive to light.  Cardiovascular:     Rate and Rhythm: Normal rate and regular rhythm.  Pulmonary:     Effort: Pulmonary effort is normal. No respiratory distress.  Abdominal:     General: There is no distension.     Palpations: Abdomen is soft.  Musculoskeletal:        General: Normal range of motion.     Cervical back: Normal range of motion and neck supple.  Skin:    General: Skin is warm and dry.  Neurological:     General: No focal deficit present.     Mental Status: He is alert and oriented to person, place, and time.     Procedures  Procedures CT CHEST ABDOMEN PELVIS WO CONTRAST Result Date: 10/16/2023 CLINICAL DATA:  Dale Sanford today. EXAM: CT CHEST, ABDOMEN AND PELVIS WITHOUT CONTRAST TECHNIQUE: Multidetector CT imaging of the chest, abdomen and pelvis was performed following the standard protocol without IV contrast. RADIATION DOSE REDUCTION: This exam was performed according to the departmental dose-optimization program which includes automated  exposure control, adjustment of the mA and/or kV according to patient size and/or use of iterative reconstruction technique. COMPARISON:  Remote CT scan from 2017 FINDINGS: CT CHEST FINDINGS Cardiovascular: The heart is normal in size. No pericardial effusion. The aorta is normal in caliber. No dissection. 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 Mediastinum/Nodes: No mediastinal or hilar mass or lymphadenopathy. The esophagus is unremarkable. Lungs/Pleura: No infiltrates, edema or effusions. No pulmonary contusion or pneumothorax. Find largely nodular peripheral interstitial process likely early interstitial lung disease. No bronchiectasis. The central tracheobronchial tree is unremarkable. No worrisome pulmonary nodules or pulmonary lesions. Musculoskeletal: No acute bony findings. No sternal, rib or thoracic vertebral body fractures. CT ABDOMEN PELVIS FINDINGS Hepatobiliary: Stable left hepatic lobe cyst. No worrisome hepatic lesions are identified without contrast. No intrahepatic biliary dilatation. The gallbladder is contracted. Gallstones suspected. No common bile duct dilatation. Pancreas: Unremarkable. No pancreatic ductal dilatation or surrounding inflammatory changes. Spleen: Normal in size without focal abnormality. Adrenals/Urinary Tract: The adrenal glands are normal. There are 2 exophytic lesions associated with the left kidney. Upper pole midpole junction lesion laterally measures 13 mm and measures 32 Hounsfield units. The interpolar lesion posteromedially measures 10 mm and measures 30 Hounsfield units. Possible complex/hemorrhagic or proteinaceous cysts but could not exclude small solid lesions. Recommend follow-up MRI abdomen without and with contrast for further evaluation. No renal calculi or hydronephrosis. The bladder is grossly normal. Stomach/Bowel: Stomach,  duodenum, small and colon are grossly normal. No inflammatory process, mass lesion or obstructive findings.  Vascular/Lymphatic: Moderate atherosclerotic calcifications involving the aorta and iliac arteries but no aneurysm. No abdominal or pelvic lymphadenopathy. No mesenteric or retroperitoneal. Reproductive: Enlarged prostate gland impressing on the base of the bladder. Other: No pelvic mass or adenopathy. No free pelvic fluid collections. No inguinal mass or adenopathy. Small periumbilical abdominal wall hernia containing fat. Small left inguinal hernia containing fat. Musculoskeletal: No acute bony findings. There is a stable remote compression fracture of L2. Right total hip arthroplasty noted. No complicating features. The pubic symphysis and SI joints are intact. No pelvic fractures. IMPRESSION: 1. No acute findings involving the chest, abdomen or pelvis. 2. 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 3. 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 4. Two exophytic lesions associated with the left kidney. Possible complex/hemorrhagic or proteinaceous cysts but could not exclude small solid lesions. Recommend follow-up MRI abdomen without and with contrast for further evaluation (non urgent). 5. Enlarged prostate gland impressing on the base of the bladder. Aortic Atherosclerosis (ICD10-I70.0). Electronically Signed   By: MYRTIS Stammer M.D.   On: 10/16/2023 22:42   CT Head Wo Contrast Result Date: 10/16/2023 CLINICAL DATA:  Head trauma, minor (Age >= 65y); Neck trauma (Age >= 65y) EXAM: CT HEAD WITHOUT CONTRAST CT CERVICAL SPINE WITHOUT CONTRAST TECHNIQUE: Multidetector CT imaging of the head and cervical spine was performed following the standard protocol without intravenous contrast. Multiplanar CT image reconstructions of the cervical spine were also generated. RADIATION DOSE REDUCTION: This exam was performed according to the departmental dose-optimization program which includes automated exposure control, adjustment of the mA and/or kV according to patient size and/or use of iterative  reconstruction technique. COMPARISON:  MRI head 11/19/1999 FINDINGS: CT HEAD FINDINGS Brain: No evidence of acute infarction, hemorrhage, hydrocephalus, extra-axial collection or mass lesion/mass effect. 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 Patchy white matter hypodensities, compatible with chronic microvascular ischemic disease. Vascular: No hyperdense vessel or unexpected calcification. Skull: No acute fracture. Sinuses/Orbits: No acute finding. CT CERVICAL SPINE FINDINGS Alignment: Normal. Skull base and vertebrae: No acute fracture. No primary bone lesion or focal pathologic process. Soft tissues and spinal canal: No prevertebral fluid or swelling. No visible canal hematoma. Disc levels: Moderate multilevel bony degenerative change. Facet and uncovertebral hypertrophy contributes to varying degrees of neural foraminal stenosis. Upper chest: Lung apices are clear. IMPRESSION: 1. No evidence of acute intracranial abnormality. 2. No evidence of acute fracture or traumatic malalignment in the cervical spine. 3. Remote infarcts and chronic microvascular ischemic disease. Electronically Signed   By: Gilmore GORMAN Molt M.D.   On: 10/16/2023 21:00   CT Cervical Spine Wo Contrast Result Date: 10/16/2023 CLINICAL DATA:  Head trauma, minor (Age >= 65y); Neck trauma (Age >= 65y) EXAM: CT HEAD WITHOUT CONTRAST CT CERVICAL SPINE WITHOUT CONTRAST TECHNIQUE: Multidetector CT imaging of the head and cervical spine was performed following the standard protocol without intravenous contrast. Multiplanar CT image reconstructions of the cervical spine were also generated. RADIATION DOSE REDUCTION: This exam was performed according to the departmental dose-optimization program which includes automated exposure control, adjustment of the mA and/or kV according to patient size and/or use of iterative reconstruction technique. COMPARISON:  MRI head 11/19/1999 FINDINGS: CT HEAD FINDINGS  Brain: No evidence of acute infarction, hemorrhage, hydrocephalus, extra-axial collection or mass lesion/mass effect. 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 Patchy white matter hypodensities, compatible with chronic  microvascular ischemic disease. Vascular: No hyperdense vessel or unexpected calcification. Skull: No acute fracture. Sinuses/Orbits: No acute finding. CT CERVICAL SPINE FINDINGS Alignment: Normal. Skull base and vertebrae: No acute fracture. No primary bone lesion or focal pathologic process. Soft tissues and spinal canal: No prevertebral fluid or swelling. No visible canal hematoma. Disc levels: Moderate multilevel bony degenerative change. Facet and uncovertebral hypertrophy contributes to varying degrees of neural foraminal stenosis. Upper chest: Lung apices are clear. IMPRESSION: 1. No evidence of acute intracranial abnormality. 2. No evidence of acute fracture or traumatic malalignment in the cervical spine. 3. Remote infarcts and chronic microvascular ischemic disease. Electronically Signed   By: Gilmore GORMAN Molt M.D.   On: 10/16/2023 21:00   Labs Reviewed  COMPREHENSIVE METABOLIC PANEL WITH GFR - Abnormal; Notable for the following components:      Result Value   Potassium >7.5 (*)    CO2 14 (*)    Glucose, Bld 134 (*)    BUN 74 (*)    Creatinine, Ser 3.31 (*)    GFR, Estimated 19 (*)    Anion gap 17 (*)    All other components within normal limits  CBC WITH DIFFERENTIAL/PLATELET - Abnormal; Notable for the following components:   WBC 22.0 (*)    Neutro Abs 19.1 (*)    Abs Immature Granulocytes 0.12 (*)    All other components within normal limits  LACTIC ACID, PLASMA - Abnormal; Notable for the following components:   Lactic Acid, Venous 2.6 (*)    All other components within normal limits  BASIC METABOLIC PANEL WITH GFR - Abnormal; Notable for the following components:   Sodium 134 (*)    Potassium >7.5 (*)    CO2 14 (*)     Glucose, Bld 120 (*)    BUN 79 (*)    Creatinine, Ser 3.21 (*)    GFR, Estimated 19 (*)    All other components within normal limits  TROPONIN T, HIGH SENSITIVITY - Abnormal; Notable for the following components:   Troponin T High Sensitivity 26 (*)    All other components within normal limits  TROPONIN T, HIGH SENSITIVITY - Abnormal; Notable for the following components:   Troponin T High Sensitivity 23 (*)    All other components within normal limits  GASTROINTESTINAL PANEL BY PCR, STOOL (REPLACES STOOL CULTURE)  C DIFFICILE QUICK SCREEN W PCR REFLEX    CULTURE, BLOOD (ROUTINE X 2)  CULTURE, BLOOD (ROUTINE X 2)  CK  MAGNESIUM   URINALYSIS, ROUTINE W REFLEX MICROSCOPIC  LACTIC ACID, PLASMA  POTASSIUM    ED Course / MDM   Clinical Course as of 10/16/23 2326  Mon Oct 16, 2023  2159 Having nursing re-draw BMP with straight stick.  [RR]  2210 Spoke with nephrology, Dr. Dolan.  Does not be the patient needs emergent dialysis.  Recommends giving an additional liter of fluid as well as 80 mg of Lasix .  Agrees with the hyperkalemia orders placed.  Asked that the potassium be checked and likely needs consultation in the morning to determine if patient needs dialysis or not. [RR]    Clinical Course User Index [RR] Bernis Ernst, PA-C   Care assumed from previous provider.  See note for full HPI.  In summation 73 year old here with near syncopal episode.  Has been having few days of watery diarrhea.  Patient with significant lab abnormalities.  Plan on scanning CT chest abdomen and pelvis also repeating metabolic panel.  Appears Clinical research associate spoke with nephrology who recommended temporizing  measures for hyperkalemia and can follow-up in the morning.  Does not need emergent dialysis tonight.  Patient agreeable for admission.  Labs and imaging personally viewed and interpreted:  CBC leukocytosis 22 CMP potassium greater than 7.5, creatinine 3.31, anion gap 17----repeat potassium greater than  7.5, creatinine 3.21 Lactic 2.6 Troponin 23 CK 91  Chart review patient is spironolactone .  He is unsure if he is taking this.  Has not been unable to provide stool sample.  Will have nurses BladderScan him and In-N-Out cath to assess for urinary retention as has of his renal failure.  Will admit to medicine for further workup and management.  Not had any arrhythmia here on telemetry.   CONSULT with Dr. Charlton with medicine who is agreeable to accept patient in transfer for admission.  Recommends repeat potassium in a few hours if he is not transferred at that point.   Medical Decision Making Amount and/or Complexity of Data Reviewed Independent Historian: friend External Data Reviewed: labs, radiology, ECG and notes. Labs: ordered. Decision-making details documented in ED Course. Radiology: ordered and independent interpretation performed. Decision-making details documented in ED Course. ECG/medicine tests: ordered and independent interpretation performed. Decision-making details documented in ED Course.  Risk OTC drugs. Prescription drug management. Parenteral controlled substances. Decision regarding hospitalization. Diagnosis or treatment significantly limited by social determinants of health.         Diania Co A, PA-C 10/16/23 2326    Dreama Longs, MD 10/17/23 1038

## 2023-10-16 NOTE — Progress Notes (Signed)
 Plan of Care Note for accepted transfer   Patient: Dale Sanford MRN: [id number]   DOA: 10/16/2023  Facility requesting transfer: [facility]   Requesting Provider: Rosebud Fass, PA   Reason for transfer: Hyperkalemia   Facility course: 73 yr old man with HTN, hx of AVR, and NICM who presents with lightheadedness and a fall after 2 wks of GI symptoms.   He had N/V/D 2 weeks ago and while the N/V resolved, he continues to have loose stools. He became lightheaded today and fell.   Labs are most notable for potassium >7.5, CO2 14, SCr 3.31, and WBC 22.0. There is no hydronephrosis or other acute findings on CT head or CT chest/abdomen/pelvis.   Dr. Dolan of nephrology was consulted by the ED PA and the patient was given IVF, IV calcium , insulin  with dextrose , bicarbonate, Lokelma , Lasix , and antibiotics.   Plan of care: The patient is accepted for admission to Progressive unit, at [facility].   Author: Evalene GORMAN Sprinkles, MD 10/16/2023  Check [email or web address] for on-call coverage.  Nursing staff, Please call [facility] Admits & Consults System-Wide number on Amion as soon as patient's arrival, so appropriate admitting provider can evaluate the pt.

## 2023-10-16 NOTE — ED Provider Notes (Cosign Needed Addendum)
 [facility] EMERGENCY DEPARTMENT AT [facility] Provider Note   CSN: [id number] Arrival date & time: 10/16/23  8171     Patient presents with: Dale Sanford is a 73 y.o. male with history of hypertension, CHF, AVR presents to the Emergency Department today for evaluation after fall/near syncopal episode.  Daughter contributes to history as well.  Has been having decreased p.o. intake for the past few months to year does not like he eats enough per daughter.  Today he was at the [facility] getting something to eat when he stood up felt lightheaded and immediately fell.  He reports he reports falling and was able to get up by himself.  He denies hitting his head.  He denies any chest pain, shortness of breath, headache, vision changes prior to that.  Denies any dysuria or hematuria.  Denies any abdominal pain, nausea, vomiting.  Daughter reports that 2 weeks ago he did have a stomach virus with some nausea and vomiting however that is subsided but the diarrhea has persisted.  Around 2-3 episodes per day.  Not black or bloody.  No fevers.  Patient reports he just feels some generalized weakness.  Daughter reports that her sister noticed that he has been grabbing his left side of his chest/shoulder over the past few weeks to months however patient does have known shoulder pain. Allergic to Nyquil.   Fall Pertinent negatives include no chest pain, no abdominal pain, no headaches and no shortness of breath.       Prior to Admission medications   Medication Sig Start Date End Date Taking? Authorizing Provider  aspirin  EC 81 MG tablet 1 tablet    [provider]  metoprolol  (TOPROL -XL) 200 MG 24 hr tablet TAKE 1 TABLET BY MOUTH EVERY DAY 09/25/23   Shlomo Wilbert SAUNDERS, MD  sacubitril -valsartan  (ENTRESTO ) 97-103 MG Take 1 tablet by mouth 2 (two) times daily. 11/28/22   Shlomo Wilbert SAUNDERS, MD  spironolactone  (ALDACTONE ) 25 MG tablet TAKE 1 TABLET (25 MG TOTAL) BY MOUTH DAILY. 08/29/23   Shlomo Wilbert SAUNDERS, MD    Allergies: Nyquil multi-symptom [pseudoeph-doxylamine-dm-apap]    Review of Systems  Constitutional:  Negative for chills and fever.  Eyes:  Negative for visual disturbance.  Respiratory:  Negative for shortness of breath.   Cardiovascular:  Negative for chest pain, palpitations and leg swelling.  Gastrointestinal:  Positive for diarrhea. Negative for abdominal pain, anal bleeding, blood in stool, nausea and vomiting.  Genitourinary:  Negative for dysuria and hematuria.  Neurological:  Positive for light-headedness. Negative for headaches.    Updated Vital Signs BP (!) 138/40   Pulse 82   Temp 98.4 F (36.9 C) (Oral)   Resp (!) 23   SpO2 100%   Physical Exam Vitals and nursing note reviewed.  Constitutional:      General: He is not in acute distress.    Appearance: He is not ill-appearing or toxic-appearing.  HENT:     Mouth/Throat:     Mouth: Mucous membranes are dry.  Eyes:     General: No scleral icterus.    Extraocular Movements: Extraocular movements intact.     Pupils: Pupils are equal, round, and reactive to light.  Cardiovascular:     Rate and Rhythm: Normal rate.  Pulmonary:     Effort: Pulmonary effort is normal. No respiratory distress.     Breath sounds: Normal breath sounds.  Abdominal:     General: There is no distension.  Palpations: Abdomen is soft.     Tenderness: There is no abdominal tenderness. There is no guarding or rebound.  Musculoskeletal:     Cervical back: Normal range of motion. No tenderness.     Right lower leg: No edema.     Left lower leg: No edema.  Skin:    General: Skin is warm and dry.  Neurological:     General: No focal deficit present.     Mental Status: He is alert. Mental status is at baseline.     Cranial Nerves: No cranial nerve deficit, dysarthria or facial asymmetry.     Sensory: No sensory deficit.     Motor: No weakness.     Coordination: Finger-Nose-Finger Test normal.     (all labs ordered  are listed, but only abnormal results are displayed) Labs Reviewed  COMPREHENSIVE METABOLIC PANEL WITH GFR - Abnormal; Notable for the following components:      Result Value   Potassium >7.5 (*)    CO2 14 (*)    Glucose, Bld 134 (*)    BUN 74 (*)    Creatinine, Ser 3.31 (*)    GFR, Estimated 19 (*)    Anion gap 17 (*)    All other components within normal limits  CBC WITH DIFFERENTIAL/PLATELET - Abnormal; Notable for the following components:   WBC 22.0 (*)    Neutro Abs 19.1 (*)    Abs Immature Granulocytes 0.12 (*)    All other components within normal limits  LACTIC ACID, PLASMA - Abnormal; Notable for the following components:   Lactic Acid, Venous 2.6 (*)    All other components within normal limits  TROPONIN T, HIGH SENSITIVITY - Abnormal; Notable for the following components:   Troponin T High Sensitivity 26 (*)    All other components within normal limits  GASTROINTESTINAL PANEL BY PCR, STOOL (REPLACES STOOL CULTURE)  C DIFFICILE QUICK SCREEN W PCR REFLEX    CULTURE, BLOOD (ROUTINE X 2)  CULTURE, BLOOD (ROUTINE X 2)  CK  MAGNESIUM   URINALYSIS, ROUTINE W REFLEX MICROSCOPIC  LACTIC ACID, PLASMA  BASIC METABOLIC PANEL WITH GFR  TROPONIN T, HIGH SENSITIVITY    EKG: EKG Interpretation Date/Time:  Monday October 16 2023 18:36:05 EDT Ventricular Rate:  81 PR Interval:  214 QRS Duration:  120 QT Interval:  372 QTC Calculation: 432 R Axis:   -34  Text Interpretation: Sinus rhythm with 1st degree A-V block Left axis deviation Left ventricular hypertrophy with QRS widening and repolarization abnormality ( R in aVL , Cornell product ) Abnormal ECG When compared with ECG of 04-Mar-2020 12:03, No significant change since last tracing Confirmed by Dreama Longs ([id number]) on 10/16/2023 8:15:06 PM  Radiology: CT Head Wo Contrast Result Date: 10/16/2023 CLINICAL DATA:  Head trauma, minor (Age >= 65y); Neck trauma (Age >= 65y) EXAM: CT HEAD WITHOUT CONTRAST CT CERVICAL SPINE  WITHOUT CONTRAST TECHNIQUE: Multidetector CT imaging of the head and cervical spine was performed following the standard protocol without intravenous contrast. Multiplanar CT image reconstructions of the cervical spine were also generated. RADIATION DOSE REDUCTION: This exam was performed according to the departmental dose-optimization program which includes automated exposure control, adjustment of the mA and/or kV according to patient size and/or use of iterative reconstruction technique. COMPARISON:  MRI head 11/19/1999 FINDINGS: CT HEAD FINDINGS Brain: No evidence of acute infarction, hemorrhage, hydrocephalus, extra-axial collection or mass lesion/mass effect. 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 Patchy white matter hypodensities, compatible with chronic  microvascular ischemic disease. Vascular: No hyperdense vessel or unexpected calcification. Skull: No acute fracture. Sinuses/Orbits: No acute finding. CT CERVICAL SPINE FINDINGS Alignment: Normal. Skull base and vertebrae: No acute fracture. No primary bone lesion or focal pathologic process. Soft tissues and spinal canal: No prevertebral fluid or swelling. No visible canal hematoma. Disc levels: Moderate multilevel bony degenerative change. Facet and uncovertebral hypertrophy contributes to varying degrees of neural foraminal stenosis. Upper chest: Lung apices are clear. IMPRESSION: 1. No evidence of acute intracranial abnormality. 2. No evidence of acute fracture or traumatic malalignment in the cervical spine. 3. Remote infarcts and chronic microvascular ischemic disease. Electronically Signed   By: Gilmore GORMAN Molt M.D.   On: 10/16/2023 21:00   CT Cervical Spine Wo Contrast Result Date: 10/16/2023 CLINICAL DATA:  Head trauma, minor (Age >= 65y); Neck trauma (Age >= 65y) EXAM: CT HEAD WITHOUT CONTRAST CT CERVICAL SPINE WITHOUT CONTRAST TECHNIQUE: Multidetector CT imaging of the head and cervical spine was  performed following the standard protocol without intravenous contrast. Multiplanar CT image reconstructions of the cervical spine were also generated. RADIATION DOSE REDUCTION: This exam was performed according to the departmental dose-optimization program which includes automated exposure control, adjustment of the mA and/or kV according to patient size and/or use of iterative reconstruction technique. COMPARISON:  MRI head 11/19/1999 FINDINGS: CT HEAD FINDINGS Brain: No evidence of acute infarction, hemorrhage, hydrocephalus, extra-axial collection or mass lesion/mass effect. 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 Patchy white matter hypodensities, compatible with chronic microvascular ischemic disease. Vascular: No hyperdense vessel or unexpected calcification. Skull: No acute fracture. Sinuses/Orbits: No acute finding. CT CERVICAL SPINE FINDINGS Alignment: Normal. Skull base and vertebrae: No acute fracture. No primary bone lesion or focal pathologic process. Soft tissues and spinal canal: No prevertebral fluid or swelling. No visible canal hematoma. Disc levels: Moderate multilevel bony degenerative change. Facet and uncovertebral hypertrophy contributes to varying degrees of neural foraminal stenosis. Upper chest: Lung apices are clear. IMPRESSION: 1. No evidence of acute intracranial abnormality. 2. No evidence of acute fracture or traumatic malalignment in the cervical spine. 3. Remote infarcts and chronic microvascular ischemic disease. Electronically Signed   By: Gilmore GORMAN Molt M.D.   On: 10/16/2023 21:00   .Critical Care  Performed by: Bernis Ernst, PA-C Authorized by: Bernis Ernst, PA-C   Critical care provider statement:    Critical care time (minutes):  30   Critical care was necessary to treat or prevent imminent or life-threatening deterioration of the following conditions:  Metabolic crisis   Critical care was time spent personally by me on  the following activities:  Development of treatment plan with patient or surrogate, discussions with consultants, evaluation of patient's response to treatment, examination of patient, ordering and review of laboratory studies, ordering and review of radiographic studies, ordering and performing treatments and interventions, pulse oximetry, re-evaluation of patient's condition, review of old charts and obtaining history from patient or surrogate Comments:     Hyperkalemia, requiring multiple medications and nephrology consultation.     Medications Ordered in the ED  lactated ringers  infusion (has no administration in time range)  albuterol  (PROVENTIL ) (2.5 MG/3ML) 0.083% nebulizer solution 10 mg (has no administration in time range)  calcium  gluconate 1 g/ 50 mL sodium chloride  IVPB (has no administration in time range)  sodium bicarbonate  injection 50 mEq (has no administration in time range)  sodium zirconium cyclosilicate  (LOKELMA ) packet 10 g (has no administration in time range)  insulin  aspart (novoLOG ) injection 5 Units (  has no administration in time range)    And  dextrose  50 % solution 50 mL (has no administration in time range)  lactated ringers  bolus 1,000 mL (has no administration in time range)  furosemide  (LASIX ) injection 80 mg (has no administration in time range)  lactated ringers  bolus 1,000 mL (1,000 mLs Intravenous New Bag/Given 10/16/23 2057)  ondansetron  (ZOFRAN ) injection 4 mg (4 mg Intravenous Given 10/16/23 2141)   Medical Decision Making Amount and/or Complexity of Data Reviewed Labs: ordered. Radiology: ordered.  Risk OTC drugs. Prescription drug management.   73 y.o. male presents to the ER for evaluation of lightheadedness/fall. Differential diagnosis includes but is not limited to CVA, ACS, arrhythmia, vasovagal / orthostatic hypotension, sepsis, hypoglycemia, electrolyte disturbance, respiratory failure, anemia, dehydration, heat injury, polypharmacy,  malignancy, anxiety/panic attack. Vital signs blood pressure 121/59, respiratory rate of 22 documented however patient does not appear in any acute distress.  Otherwise unremarkable. Physical exam as noted above.   Patient reports that he stood up fast and felt lightheaded and fell but denies any loss conscious remembers the entire fall.  He denies any other head or neck pain.  Has some chronic shoulder pain that is at his baseline.  He reports he feels some generalized weakness/fatigue but no chest pain, shortness of breath, abdominal pain, nausea, vomiting.  Has had decreased p.o. intake over the past few months to years.  He has been having recent diarrheal episodes for the past 2 weeks ever since having a GI bug 2 weeks prior.  May be having some dehydration.  Fluids ordered.  Patient does not have any head or neck tenderness however given the questionable syncopal episode, have ordered imaging and labs.  Last echocardiogram was August 2024 shows an EF of 45 to 50%.  I have ordered the patient some fluids as well as he appears volume down.  I independently reviewed and interpreted the patient's labs.  Troponin at 26.  CBC shows significant leukocytosis at 22 with a left shift.  No anemia.  CMP shows sodium 135 with a potassium greater than 7.5.  Bicarb of 14 with glucose of 134.  BUN elevated at 74 with a creatinine 3.31.  Baseline creatinine around 1.3.  Lactic acid elevated 26.  CK within normal limits at 91.  C. difficile and GI stool panel ordered however still not collected.  Blood cultures collected.  CT Head and Cervical spine 1. No evidence of acute intracranial abnormality. 2. No evidence of acute fracture or traumatic malalignment in the cervical spine. 3. Remote infarcts and chronic microvascular ischemic disease. Per radiologist's interpretation.    EKG reviewed and interpreted by my attending and read as no significant changes last tracing.   Patient with significant hyper kalemia.  From  medical history on snapshot, patient does appear to be on spironolactone .  He does have an AKI with the elevated potassium, likely not hemolyzed specimen however repeat BMP was ordered.  Hyperkalemia order set was ordered as well.  Please see ED course for nephrology recommendations.  Give the patient leukocytosis, initially a CT abdomen pelvis with contrast was ordered however given the patient's elevated creatinine, this was changed to without contrast.  Bladder scan ordered and pending.  Will handoff to oncoming shift for follow-up and admission.  10:28 PM Care of Dale Sanford  transferred to PA Henderly at the end of my shift as the patient will require reassessment once labs/imaging have resulted. Patient presentation, ED course, and plan of care discussed with review  of all pertinent labs and imaging. Please see his/her note for further details regarding further ED course and disposition. Plan at time of handoff is follow up with labs and imaging, re-assess, patient will need to be admitted. This may be altered or completely changed at the discretion of the oncoming team pending results of further workup.  Portions of this report may have been transcribed using voice recognition software. Every effort was made to ensure accuracy; however, inadvertent computerized transcription errors may be present.    Final diagnoses:  None    ED Discharge Orders     None          Bernis Ernst, PA-C 10/16/23 2227    Bernis Ernst, PA-C 10/16/23 2228    Dreama Longs, MD 10/17/23 1038

## 2023-10-16 NOTE — ED Triage Notes (Signed)
 Pt fell today after getting up too quickly while at lunch.  No LOC, small bruise and scabbing to left elbow.  Pt thinks he just didn't eat enough.  Pt reports BM's have also been looser than normal in past 2 weeks.

## 2023-10-16 NOTE — ED Notes (Signed)
 Patient transported to CT

## 2023-10-16 NOTE — Progress Notes (Signed)
 Brief nephrology note: Patient is at [facility]. ER provider called me with hyperkalemia. I have reviewed the chart and lab results. Hyperkalemia due to entresto  and aldactone  in the setting of AKI. -Already received medical treatment including IVF, insulin  dextrose , bicarb, lokelma  etc. Recommention: -bladder scan -order lasix  80 mg IV and 1 L NS. -repeat BMP in next few hours. If K remains >6 then repeat lokelma , bicarb and gentle IVF. -admit to the hospital preferably to [facility] -hopefully we can avoid dialysis and manage medically. -hold entresto , aldactone    Our team will  follow. Dr.  Dolan,  [facility]

## 2023-10-16 NOTE — ED Notes (Signed)
 ED Provider at bedside.

## 2023-10-17 DIAGNOSIS — Z79899 Other long term (current) drug therapy: Secondary | ICD-10-CM | POA: Diagnosis not present

## 2023-10-17 DIAGNOSIS — N1832 Chronic kidney disease, stage 3b: Secondary | ICD-10-CM | POA: Diagnosis present

## 2023-10-17 DIAGNOSIS — N179 Acute kidney failure, unspecified: Secondary | ICD-10-CM

## 2023-10-17 DIAGNOSIS — Z8679 Personal history of other diseases of the circulatory system: Secondary | ICD-10-CM | POA: Diagnosis not present

## 2023-10-17 DIAGNOSIS — E872 Acidosis, unspecified: Secondary | ICD-10-CM | POA: Diagnosis present

## 2023-10-17 DIAGNOSIS — I959 Hypotension, unspecified: Secondary | ICD-10-CM | POA: Diagnosis present

## 2023-10-17 DIAGNOSIS — E8729 Other acidosis: Secondary | ICD-10-CM | POA: Diagnosis not present

## 2023-10-17 DIAGNOSIS — Z888 Allergy status to other drugs, medicaments and biological substances status: Secondary | ICD-10-CM | POA: Diagnosis not present

## 2023-10-17 DIAGNOSIS — I5042 Chronic combined systolic (congestive) and diastolic (congestive) heart failure: Secondary | ICD-10-CM | POA: Diagnosis present

## 2023-10-17 DIAGNOSIS — I440 Atrioventricular block, first degree: Secondary | ICD-10-CM | POA: Diagnosis present

## 2023-10-17 DIAGNOSIS — Z952 Presence of prosthetic heart valve: Secondary | ICD-10-CM | POA: Diagnosis not present

## 2023-10-17 DIAGNOSIS — F32A Depression, unspecified: Secondary | ICD-10-CM | POA: Diagnosis present

## 2023-10-17 DIAGNOSIS — N189 Chronic kidney disease, unspecified: Secondary | ICD-10-CM | POA: Diagnosis present

## 2023-10-17 DIAGNOSIS — D72829 Elevated white blood cell count, unspecified: Secondary | ICD-10-CM | POA: Diagnosis present

## 2023-10-17 DIAGNOSIS — K219 Gastro-esophageal reflux disease without esophagitis: Secondary | ICD-10-CM | POA: Diagnosis present

## 2023-10-17 DIAGNOSIS — R55 Syncope and collapse: Secondary | ICD-10-CM | POA: Diagnosis not present

## 2023-10-17 DIAGNOSIS — Z7982 Long term (current) use of aspirin: Secondary | ICD-10-CM | POA: Diagnosis not present

## 2023-10-17 DIAGNOSIS — I130 Hypertensive heart and chronic kidney disease with heart failure and stage 1 through stage 4 chronic kidney disease, or unspecified chronic kidney disease: Secondary | ICD-10-CM | POA: Diagnosis present

## 2023-10-17 DIAGNOSIS — E875 Hyperkalemia: Secondary | ICD-10-CM | POA: Diagnosis present

## 2023-10-17 DIAGNOSIS — I509 Heart failure, unspecified: Secondary | ICD-10-CM | POA: Diagnosis not present

## 2023-10-17 DIAGNOSIS — Z96641 Presence of right artificial hip joint: Secondary | ICD-10-CM | POA: Diagnosis present

## 2023-10-17 DIAGNOSIS — I10 Essential (primary) hypertension: Secondary | ICD-10-CM | POA: Diagnosis not present

## 2023-10-17 DIAGNOSIS — R197 Diarrhea, unspecified: Secondary | ICD-10-CM | POA: Diagnosis not present

## 2023-10-17 DIAGNOSIS — I5022 Chronic systolic (congestive) heart failure: Secondary | ICD-10-CM | POA: Diagnosis present

## 2023-10-17 DIAGNOSIS — Z8249 Family history of ischemic heart disease and other diseases of the circulatory system: Secondary | ICD-10-CM | POA: Diagnosis not present

## 2023-10-17 DIAGNOSIS — T465X5A Adverse effect of other antihypertensive drugs, initial encounter: Secondary | ICD-10-CM | POA: Diagnosis present

## 2023-10-17 DIAGNOSIS — T500X5A Adverse effect of mineralocorticoids and their antagonists, initial encounter: Secondary | ICD-10-CM | POA: Diagnosis present

## 2023-10-17 DIAGNOSIS — I420 Dilated cardiomyopathy: Secondary | ICD-10-CM | POA: Diagnosis present

## 2023-10-17 DIAGNOSIS — I340 Nonrheumatic mitral (valve) insufficiency: Secondary | ICD-10-CM | POA: Diagnosis not present

## 2023-10-17 LAB — PROCALCITONIN: Procalcitonin: <0.1 ng/mL

## 2023-10-17 LAB — RENAL FUNCTION PANEL
Albumin: 3.2 g/dL — ABNORMAL LOW (ref 3.5–5.0)
Anion gap: 12 (ref 5–15)
BUN: 64 mg/dL — ABNORMAL HIGH (ref 8–23)
CO2: 18 mmol/L — ABNORMAL LOW (ref 22–32)
Calcium: 8.6 mg/dL — ABNORMAL LOW (ref 8.9–10.3)
Chloride: 108 mmol/L (ref 98–111)
Creatinine, Ser: 2.11 mg/dL — ABNORMAL HIGH (ref 0.61–1.24)
GFR, Estimated: 32 mL/min — ABNORMAL LOW (ref >60)
Glucose, Bld: 107 mg/dL — ABNORMAL HIGH (ref 70–99)
Phosphorus: 4.2 mg/dL (ref 2.5–4.6)
Potassium: 6 mmol/L — ABNORMAL HIGH (ref 3.5–5.1)
Sodium: 138 mmol/L (ref 135–145)

## 2023-10-17 LAB — BASIC METABOLIC PANEL WITH GFR
Anion gap: 17 — ABNORMAL HIGH (ref 5–15)
BUN: 74 mg/dL — ABNORMAL HIGH (ref 8–23)
CO2: 16 mmol/L — ABNORMAL LOW (ref 22–32)
Calcium: 8.9 mg/dL (ref 8.9–10.3)
Chloride: 104 mmol/L (ref 98–111)
Creatinine, Ser: 2.72 mg/dL — ABNORMAL HIGH (ref 0.61–1.24)
GFR, Estimated: 24 mL/min — ABNORMAL LOW (ref >60)
Glucose, Bld: 172 mg/dL — ABNORMAL HIGH (ref 70–99)
Potassium: 5.5 mmol/L — ABNORMAL HIGH (ref 3.5–5.1)
Sodium: 137 mmol/L (ref 135–145)

## 2023-10-17 LAB — RESP PANEL BY RT-PCR (RSV, FLU A&B, COVID)  RVPGX2
Influenza A by PCR: NEGATIVE
Influenza B by PCR: NEGATIVE
Resp Syncytial Virus by PCR: NEGATIVE
SARS Coronavirus 2 by RT PCR: NEGATIVE

## 2023-10-17 LAB — URINALYSIS, ROUTINE W REFLEX MICROSCOPIC
Bacteria, UA: NONE SEEN
Bilirubin Urine: NEGATIVE
Glucose, UA: 100 mg/dL — AB
Hgb urine dipstick: NEGATIVE
Ketones, ur: NEGATIVE mg/dL
Nitrite: NEGATIVE
Protein, ur: NEGATIVE mg/dL
Specific Gravity, Urine: 1.012 (ref 1.005–1.030)
pH: 5 (ref 5.0–8.0)

## 2023-10-17 LAB — MAGNESIUM: Magnesium: 1.4 mg/dL — ABNORMAL LOW (ref 1.7–2.4)

## 2023-10-17 MED ORDER — SODIUM ZIRCONIUM CYCLOSILICATE 10 G PO PACK
10.0000 g | PACK | Freq: Two times a day (BID) | ORAL | Status: AC
  Administered 2023-10-17 (×2): 10 g via ORAL
  Filled 2023-10-17 (×2): qty 1

## 2023-10-17 MED ORDER — ACETAMINOPHEN 650 MG RE SUPP: 650.0000 mg | Freq: Four times a day (QID) | RECTAL | Status: DC | PRN

## 2023-10-17 MED ORDER — MAGNESIUM SULFATE 2 GM/50ML IV SOLN
2.0000 g | Freq: Once | INTRAVENOUS | Status: AC
  Administered 2023-10-17: 2 g via INTRAVENOUS
  Filled 2023-10-17: qty 50

## 2023-10-17 MED ORDER — ALBUTEROL SULFATE (2.5 MG/3ML) 0.083% IN NEBU: 2.5000 mg | INHALATION_SOLUTION | RESPIRATORY_TRACT | Status: DC | PRN

## 2023-10-17 MED ORDER — ACETAMINOPHEN 325 MG PO TABS
650.0000 mg | ORAL_TABLET | Freq: Four times a day (QID) | ORAL | Status: DC | PRN
  Filled 2023-10-17: qty 2

## 2023-10-17 MED ORDER — HEPARIN SODIUM (PORCINE) 5000 UNIT/ML IJ SOLN
5000.0000 [IU] | Freq: Three times a day (TID) | INTRAMUSCULAR | Status: DC
  Administered 2023-10-17 – 2023-10-19 (×6): 5000 [IU] via SUBCUTANEOUS
  Filled 2023-10-17 (×6): qty 1

## 2023-10-17 MED ORDER — LACTATED RINGERS IV BOLUS
1000.0000 mL | Freq: Once | INTRAVENOUS | Status: AC
  Administered 2023-10-17: 1000 mL via INTRAVENOUS

## 2023-10-17 MED ORDER — SODIUM CHLORIDE 0.9% FLUSH
3.0000 mL | Freq: Two times a day (BID) | INTRAVENOUS | Status: DC
  Administered 2023-10-17 – 2023-10-19 (×5): 3 mL via INTRAVENOUS

## 2023-10-17 MED ORDER — ASPIRIN 81 MG PO TBEC
81.0000 mg | DELAYED_RELEASE_TABLET | Freq: Every day | ORAL | Status: DC
  Administered 2023-10-18 – 2023-10-19 (×2): 81 mg via ORAL
  Filled 2023-10-17 (×2): qty 1

## 2023-10-17 NOTE — ED Notes (Signed)
 Updated daughter regarding pt room. [facility] in route.

## 2023-10-17 NOTE — Consult Note (Addendum)
 Dale Sanford Note  Requesting MD: Dr. Charlton  Indication for Sanford: Hyperkalemia   Assessment/Plan:  Hyperkalemia  AGMA  AKI, baseline Scr ~1.38  Presented to [facility] ED with K 7.5, bicarb 14, BUN 74, Scr 3.31, AG 17. CT without acute renal calculi or hydronephrosis.  Without acute obstruction. Patient was immediately treated with IVF, insulin  dextrose , bicarb, Lokelma  with addition of IV lasix  80 mg, UOP -900 cc. Metabolic panel improved with these interventions. Etiology 2/2 Entresto , aldactone , decrease PO intake and GI losses. No need for emergent dialysis. - Encourage PO hydration  - STOP Entresto  and Aldactone , patient is advised to follow up with PCP and Cardiologist after discharge   - CT imaging did note 2 exophytic lesion associated with the left kidney, possible complex/hemorrhagic/proteinaceous cyst, could not exclude small solid lesions. Recommend follow-up MRI abdomen w/wo contrast in the outpatient setting.  - Lokelma  BID x 2 doses; renal function already improving  HTN Blood pressures stable. Continue to hold Entresto  and aldactone .   I have independently taken a history, reviewed the chart and examined the patient. This patient encounter includes evaluation, adjustment of therapies in at least one of the key components with review of  Dr. Laray note, impression and recommendations.    I've also already edited and made adjustments to the above mentioned note.   Dale LYNWOOD ORN, MD 10/17/2023, 12:00 PM   HPI:  Dale Sanford is a 73 y.o. male with PMHx of HTN, hx of AVR, and NICM who [facility] with hyperkalemia, K >7.5, bicarb 14, Scr 3.31 and BUN 74, on-call nephrologist was consulted, patient received IVF, insulin  dextrose , bicarb, Lokelma . In addition, patient also received IV lasix  80 mg, and 1 L LR. Labs improved after the intervention. Etiology was 2/2 entresto  and aldactone  in the  setting of AKI.   Patient and family at bedside reports symptoms of feeling dizzy and weak for the past couple of days, had a near syncope/fall. No head trauma or LOC. Reports 2 weeks ago, he had a stomach bug with nausea, vomiting and diarrhea; from which diarrhea has persisted. Reports chronic decrease PO intake. No fevers or chills. On evaluation this morning, reports feeling well. Last BM yesterday morning, no nausea or vomiting.   Creatinine, Ser  Date/Time Value Ref Range Status  10/17/2023 01:53 AM 2.72 (H) 0.61 - 1.24 mg/dL Final  92/71/7974 89:91 PM 3.21 (H) 0.61 - 1.24 mg/dL Final  92/71/7974 91:97 PM 3.31 (H) 0.61 - 1.24 mg/dL Final  92/96/7975 95:91 PM 1.38 (H) 0.76 - 1.27 mg/dL Final  87/79/7978 96:74 AM 0.83 0.61 - 1.24 mg/dL Final  87/80/7978 96:75 AM 0.85 0.61 - 1.24 mg/dL Final  87/81/7978 96:70 AM 0.82 0.61 - 1.24 mg/dL Final  87/82/7978 95:85 AM 0.72 0.61 - 1.24 mg/dL Final  87/83/7978 95:90 AM 0.89 0.61 - 1.24 mg/dL Final  87/84/7978 93:59 PM 0.98 0.61 - 1.24 mg/dL Final  87/84/7978 87:54 PM 1.12 0.61 - 1.24 mg/dL Final  87/93/7978 91:56 AM 1.21 0.61 - 1.24 mg/dL Final  96/90/7978 97:53 PM 1.10 0.76 - 1.27 mg/dL Final  93/70/7979 88:97 AM 1.23 0.76 - 1.27 mg/dL Final  96/68/7979 89:94 AM 1.05 0.76 - 1.27 mg/dL Final  96/94/7979 91:59 AM 1.12 0.76 - 1.27 mg/dL Final  97/82/7979 89:80 AM 1.06 0.76 - 1.27 mg/dL Final  93/73/7980 89:77 AM 0.91 0.76 - 1.27 mg/dL Final  87/98/7982 96:40 AM 0.97 0.61 - 1.24 mg/dL Final  88/77/7982 89:78 AM 0.83  0.61 - 1.24 mg/dL Final  97/93/7982 97:50 PM 1.43 (H) 0.61 - 1.24 mg/dL Final     PMHx:   Past Medical History:  Diagnosis Date   Aortic valve disorders 09/03/2013   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  Mild to modetate AI of the AVR autograft by echo 11/2020   Avascular necrosis of hip, right (HCC) 02/18/2016   DCM (dilated cardiomyopathy) (HCC)  09/13/2017   EF 20-25% by echo with normal cors on cath.  Cardiac MRI with EF 40% 09/2018.   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   infectious   RIGHT/LEFT HEART CATH AND CORONARY ANGIOGRAPHY N/A 05/11/2018   Procedure: RIGHT/LEFT HEART CATH AND CORONARY ANGIOGRAPHY;  Surgeon: Darron Deatrice LABOR, MD;  Location: [facility];  Service: Cardiovascular;  Laterality: N/A;   TOTAL HIP ARTHROPLASTY Right 02/18/2016   Procedure: RIGHT TOTAL HIP ARTHROPLASTY ANTERIOR APPROACH;  Surgeon: Redell Shoals, MD;  Location: [facility];  Service: Orthopedics;  Laterality: Right;    Family Hx:  Family History  Problem Relation Age of Onset   CAD Father        hardening of the arteries   Cirrhosis Brother    Cancer Brother     Social History:  reports that he has never smoked. He has never used smokeless tobacco. He reports that he does not drink alcohol  and does not use drugs.  Allergies:  Allergies  Allergen Reactions   Nyquil Multi-Symptom [Pseudoeph-Doxylamine-Dm-Apap] Itching and Rash    Medications: Prior to Admission medications   Medication Sig Start Date End Date Taking? Authorizing Provider  aspirin  EC 81 MG tablet Take 81 mg by mouth daily.   Yes [provider]  metoprolol  (TOPROL -XL) 200 MG 24 hr tablet TAKE 1 TABLET BY MOUTH EVERY DAY 09/25/23  Yes Turner, Wilbert SAUNDERS, MD  sacubitril -valsartan  (ENTRESTO ) 97-103 MG Take 1 tablet by mouth 2 (two) times daily. 11/28/22  Yes Turner, Wilbert SAUNDERS, MD  spironolactone  (ALDACTONE ) 25 MG tablet TAKE 1 TABLET (25 MG TOTAL) BY MOUTH DAILY. 08/29/23  Yes Shlomo Wilbert SAUNDERS, MD    I have reviewed the patient's current  medications. Scheduled: Continuous:  lactated ringers  100 mL/hr at 10/16/23 2343    Labs:  Results for orders placed or performed during the hospital encounter of 10/16/23 (from the past 48 hours)  Troponin T, High Sensitivity     Status: Abnormal   Collection Time: 10/16/23  8:02 PM  Result Value Ref Range   Troponin T High Sensitivity 26 (H) <19 ng/L    Comment: (NOTE) Biotin concentrations > 1000 ng/mL falsely decrease TnT results.  Serial cardiac troponin measurements are suggested.  Refer to the Links section for chest pain algorithms and additional  guidance. Performed at [facility], [address], [place], [place] [address]   CK     Status: None   Collection Time: 10/16/23  8:02 PM  Result Value Ref Range   Total CK 91 49 - 397 U/L    Comment: Performed at [facility], [address], [place], [place] [address]  Magnesium      Status:  None   Collection Time: 10/16/23  8:02 PM  Result Value Ref Range   Magnesium  1.8 1.7 - 2.4 mg/dL    Comment: Performed at [facility], [address] [place], [place] [address]  Comprehensive metabolic panel     Status: Abnormal   Collection Time: 10/16/23  8:02 PM  Result Value Ref Range   Sodium 135 135 - 145 mmol/L   Potassium >7.5 (HH) 3.5 - 5.1 mmol/L    Comment: Critical Value, Read Back and verified with KATRINKA ALISE PEAK 2155 AD   Chloride 105 98 - 111 mmol/L   CO2 14 (L) 22 - 32 mmol/L   Glucose, Bld 134 (H) 70 - 99 mg/dL    Comment: Glucose reference range applies only to samples taken after fasting for at least 8 hours.   BUN 74 (H) 8 - 23 mg/dL   Creatinine, Ser 6.68 (H) 0.61 - 1.24 mg/dL   Calcium  9.8 8.9 - 10.3 mg/dL   Total Protein 7.8 6.5 - 8.1 g/dL   Albumin 4.6 3.5 - 5.0 g/dL   AST 19 15 - 41 U/L   ALT 22 0 - 44 U/L   Alkaline Phosphatase 68 38 - 126 U/L   Total Bilirubin 0.4 0.0 - 1.2 mg/dL   GFR, Estimated 19 (L) >60 mL/min    Comment:  (NOTE) Calculated using the CKD-EPI Creatinine Equation (2021)    Anion gap 17 (H) 5 - 15    Comment: Performed at [facility], [address], [place], [place] [address]  CBC with Differential     Status: Abnormal   Collection Time: 10/16/23  8:02 PM  Result Value Ref Range   WBC 22.0 (H) 4.0 - 10.5 K/uL   RBC 4.50 4.22 - 5.81 MIL/uL   Hemoglobin 13.4 13.0 - 17.0 g/dL   HCT 60.8 60.9 - 47.9 %   MCV 86.9 80.0 - 100.0 fL   MCH 29.8 26.0 - 34.0 pg   MCHC 34.3 30.0 - 36.0 g/dL   RDW 86.7 88.4 - 84.4 %   Platelets 355 150 - 400 K/uL   nRBC 0.0 0.0 - 0.2 %   Neutrophils Relative % 86 %   Neutro Abs 19.1 (H) 1.7 - 7.7 K/uL   Lymphocytes Relative 9 %   Lymphs Abs 1.9 0.7 - 4.0 K/uL   Monocytes Relative 4 %   Monocytes Absolute 0.8 0.1 - 1.0 K/uL   Eosinophils Relative 0 %   Eosinophils Absolute 0.0 0.0 - 0.5 K/uL   Basophils Relative 0 %   Basophils Absolute 0.0 0.0 - 0.1 K/uL   Immature Granulocytes 1 %   Abs Immature Granulocytes 0.12 (H) 0.00 - 0.07 K/uL    Comment: Performed at [facility], [address] [place], [place] [address]  Lactic acid, plasma     Status: Abnormal   Collection Time: 10/16/23  8:14 PM  Result Value Ref Range   Lactic Acid, Venous 2.6 (HH) 0.5 - 1.9 mmol/L    Comment: Critical Value, Read Back and verified with KATRINKA ALISE PEAK 2155 AD Performed at [facility], [address], [place], [place] [address]   Urinalysis, Routine w reflex microscopic -Urine, Clean Catch     Status: Abnormal   Collection Time: 10/16/23  9:05 PM  Result Value Ref Range   Color, Urine YELLOW YELLOW   APPearance HAZY (A) CLEAR   Specific Gravity, Urine 1.012 1.005 - 1.030   pH 5.0 5.0 - 8.0   Glucose, UA  100 (A) NEGATIVE mg/dL   Hgb urine dipstick NEGATIVE NEGATIVE   Bilirubin Urine NEGATIVE NEGATIVE   Ketones, ur NEGATIVE NEGATIVE mg/dL   Protein, ur NEGATIVE NEGATIVE mg/dL   Nitrite NEGATIVE NEGATIVE    Leukocytes,Ua SMALL (A) NEGATIVE   RBC / HPF 0-5 0 - 5 RBC/hpf   WBC, UA 0-5 0 - 5 WBC/hpf   Bacteria, UA NONE SEEN NONE SEEN   Squamous Epithelial / HPF 0-5 0 - 5 /HPF   Mucus PRESENT    Hyaline Casts, UA PRESENT    Sperm, UA PRESENT     Comment: Performed at [facility], [address], [place], [place] [address]  Troponin T, High Sensitivity     Status: Abnormal   Collection Time: 10/16/23 10:08 PM  Result Value Ref Range   Troponin T High Sensitivity 23 (H) <19 ng/L    Comment: (NOTE) Biotin concentrations > 1000 ng/mL falsely decrease TnT results.  Serial cardiac troponin measurements are suggested.  Refer to the Links section for chest pain algorithms and additional  guidance. Performed at [facility], [address], [place], [place] [address]   Basic metabolic panel     Status: Abnormal   Collection Time: 10/16/23 10:08 PM  Result Value Ref Range   Sodium 134 (L) 135 - 145 mmol/L   Potassium >7.5 (HH) 3.5 - 5.1 mmol/L    Comment: Critical value noted. Value is consistent with previously reported and called value    Chloride 105 98 - 111 mmol/L   CO2 14 (L) 22 - 32 mmol/L   Glucose, Bld 120 (H) 70 - 99 mg/dL    Comment: Glucose reference range applies only to samples taken after fasting for at least 8 hours.   BUN 79 (H) 8 - 23 mg/dL   Creatinine, Ser 6.78 (H) 0.61 - 1.24 mg/dL   Calcium  9.3 8.9 - 10.3 mg/dL   GFR, Estimated 19 (L) >60 mL/min    Comment: (NOTE) Calculated using the CKD-EPI Creatinine Equation (2021)    Anion gap 15 5 - 15    Comment: Performed at [facility], [address] [place], [place] [address]  Lactic acid, plasma     Status: None   Collection Time: 10/16/23 10:14 PM  Result Value Ref Range   Lactic Acid, Venous 1.7 0.5 - 1.9 mmol/L    Comment: Performed at [facility], [address], [place], [place] [address]  CBG monitoring, ED     Status: Abnormal    Collection Time: 10/16/23 11:24 PM  Result Value Ref Range   Glucose-Capillary 259 (H) 70 - 99 mg/dL    Comment: Glucose reference range applies only to samples taken after fasting for at least 8 hours.  Basic metabolic panel     Status: Abnormal   Collection Time: 10/17/23  1:53 AM  Result Value Ref Range   Sodium 137 135 - 145 mmol/L   Potassium 5.5 (H) 3.5 - 5.1 mmol/L   Chloride 104 98 - 111 mmol/L   CO2 16 (L) 22 - 32 mmol/L   Glucose, Bld 172 (H) 70 - 99 mg/dL    Comment: Glucose reference range applies only to samples taken after fasting for at least 8 hours.   BUN 74 (H) 8 - 23 mg/dL   Creatinine, Ser 7.27 (H) 0.61 - 1.24 mg/dL   Calcium  8.9 8.9 - 10.3 mg/dL   GFR, Estimated 24 (L) >60 mL/min    Comment: (NOTE) Calculated using the CKD-EPI  Creatinine Equation (2021)    Anion gap 17 (H) 5 - 15    Comment: Performed at [facility], [address] [place], [place] [address]     ROS:  As per HPI   Physical Exam: Vitals:   10/17/23 0800 10/17/23 0851  BP: 130/61 (!) 114/46  Pulse: (!) 108 100  Resp: (!) 27 20  Temp:  97.9 F (36.6 C)  SpO2: 100% 99%     General: Laying in bed, no acute distress Heart: RR Lungs: Breathing comfortably  Abdomen: soft, NT, ND Extremities: warm, no LE edema  Skin: warm to touch  Neuro: alert, answering questions appropriately     Toma Edwards PGY 2  10/17/2023, 9:43 AM

## 2023-10-17 NOTE — ED Notes (Signed)
 Blood Cultures drawn x2 before starting antibiotics.

## 2023-10-17 NOTE — H&P (Signed)
 History and Physical    Patient: Dale Sanford DOB: 03-Nov-1950 DOA: 10/16/2023 DOS: the patient was seen and examined on 10/17/2023 PCP: Merilee, L.[name], MD (Inactive)  Patient coming from: Transfer from [facility]  Chief Complaint:  Chief Complaint  Patient presents with   Fall   HPI: Dale Sanford is a 73 y.o. male with medical history significant of hypertension, nonischemic cardiomyopathy, heart failure with reduced ejection fraction, aortic valve insufficiency s/p AVR, and GERD presents with dizziness and weakness.  He experienced dizziness and weakness after getting off work and attempting to eat, describing a sensation of 'real dizzy' prior to falling to the floor. This episode occurred yesterday, and he had not eaten all day prior to the event. The patient noted that it was hot at the time.  He takes heart medications regularly, including Entresto  and spironolactone , with no recent changes. He has been on these medications for a long time. Additionally, he takes a blood pressure medication twice daily and aspirin .  He experienced a 'runny' stool yesterday but denies it being diarrhea. Occasional stomach pains are reported, but no blood in stools. He has not been on antibiotics recently.  He reports frequent urination, getting up three to four times a night to use the bathroom, which is not a new symptom. No pain with urination is noted.  No recent cough, fever, or smoking history. He does not consume alcohol . His two brothers smoke, but he does not.  In the emergency patient was noted to be afebrile with pulse elevated up to 137, respirations 34, blood pressures 91/52 138/52, and O2 saturations maintained on room air.  Initial labs from 7/28 revealed WBC 22, potassium greater than 7.5, CO2 14, BUN  74, creatinine 3.31, and anion gap 17.  Urinalysis did not show significant signs for infection.  CT scan of the head and cervical spine have been obtained  which noted no acute abnormality with some reactive moat infarcts appreciated.CT scan of the chest abdomen pelvis had been obtained without contrast which noted no acute findings, but did show signs of early interstitial lung disease, and to extra lytic lesions associated with the left kidney thought to possibly be cysts for which not urgent MRI of the abdomen with and without contrast was recommended and an enlarged prostate impressing on the base of the bladder.  Patient had been given 3 L of lactated Ringer 's, insulin , dextrose , amp of sodium bicarb, calcium  gluconate, albuterol  nebs, Lokelma ,  Lasix  80 mg IV, Rocephin , and metronidazole  IV.  Nephrology was consulted.  Most recent metabolic panel revealed potassium 5.5, CO2 16, BUN 74, creatinine 2.72, and anion gap 17.  Review of Systems: As mentioned in the history of present illness. All other systems reviewed and are negative. 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  Cardiac MRI with EF 40% 09/2018.   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 He has never used smokeless tobacco. He reports that he does not drink alcohol  and does not use drugs.  Allergies  Allergen Reactions   Nyquil Multi-Symptom [Pseudoeph-Doxylamine-Dm-Apap] Itching and Rash    Family History  Problem Relation Age of Onset   CAD Father        hardening of the arteries   Cirrhosis Brother    Cancer Brother     Prior to Admission medications   Medication Sig Start Date End Date Taking? Authorizing Provider  aspirin  EC 81 MG tablet Take 81 mg by mouth daily.   Yes [provider]  metoprolol  (TOPROL -XL) 200 MG 24 hr tablet TAKE 1 TABLET BY MOUTH EVERY DAY 09/25/23  Yes Turner, Wilbert SAUNDERS, MD  sacubitril -valsartan  (ENTRESTO ) 97-103 MG Take 1 tablet by mouth 2 (two) times daily. 11/28/22  Yes Turner, Wilbert SAUNDERS, MD  spironolactone  (ALDACTONE ) 25 MG tablet TAKE 1 TABLET (25 MG TOTAL) BY MOUTH DAILY. 08/29/23  Yes Shlomo Wilbert SAUNDERS, MD    Physical Exam: Vitals:   10/17/23 0630 10/17/23 0700 10/17/23 0800 10/17/23 0851  BP: (!) 102/43 (!) 91/50 130/61 (!) 114/46  Pulse: (!) 104 (!) 101 (!) 108 100  Resp: (!) 21 (!) 22 (!) 27 20  Temp: 98 F (36.7 C)   97.9 F (36.6 C)  TempSrc: Oral   Oral  SpO2: 100% 100% 100% 99%   Constitutional: Elderly male currently in no acute distress. Eyes: PERRL, lids and conjunctivae normal ENMT: Mucous membranes are moist. Posterior pharynx clear of any exudate or lesions.Normal dentition.  Neck: normal, supple, no masses, no thyromegaly Respiratory: clear to auscultation bilaterally, no wheezing, no crackles. Normal respiratory effort. No accessory muscle use.  Cardiovascular: Regular rate and rhythm, no murmurs / rubs / gallops. No extremity edema. 2+ pedal pulses. No carotid bruits.  Abdomen: no  tenderness, no masses palpated. No hepatosplenomegaly. Bowel sounds positive.  Musculoskeletal: no clubbing / cyanosis. No joint deformity upper and lower extremities. Good ROM, no contractures. Normal muscle tone.  Skin: no rashes, lesions, ulcers. No induration Neurologic: CN 2-12 grossly intact. Sensation intact, DTR normal. Strength 5/5 in all 4.  Psychiatric: Normal judgment and insight. Alert and oriented x 3. Normal mood.   Data Reviewed:   EKG revealed sinus rhythm  81 bpm with signs of T wave peaking.  Reviewed labs, imaging, and pertinent records as documented.  Assessment and Plan:  Acute kidney injury superimposed on chronic kidney disease stage IIIb Hyperkalemia Metabolic acidosis with increased anion gap Resolving.  On admission into the emergency department patient had been noted to initially have potassium elevated greater than 7.5, sodium bicarb 14, BUN 74, creatinine 3.31, and anion gap of 17.  EKG noted signs of T wave peaking.  Creatinine had last been 1.38 when checked on 09/21/2022.  Patient was treated with IV fluids, insulin , dextrose , sodium bicarb, Lokelma , 3 L of IV fluids, and Lasix  80 mg IV.  Thought to be  related to patient being on Entresto  and spironolactone .  Most recent BMP noted improvement with potassium 5.5, CO2 16, BUN 74, creatinine 2.72, and anion gap 17. - Admit to a progressive bed - Strict I&O's and daily weights - Avoid nephrotoxic agents - Continue IV fluids at 100 mL/h - Serial BMPs - Continue Lokelma  - Appreciate nephrology consultative services,  will follow-up for any further recommendations.  Leukocytosis Lactic acidosis Acute.  WBC elevated at 22 with initial lactic acid of 2.6.  Patient had reported having a mild cough.  CT scan of the chest abdomen pelvis did not reveal any acute abnormality with early changes of interstitial lung disease.  After IV fluids lactic acidosis resolved.  Question possibility of viral upper respiratory infection  or symptoms secondary to severe dehydration - Check Procalcitonin - Check influenza, COVID-19, and RSV(negative) - Recheck CBC tomorrow morning  Transient hypotension In the ED blood pressures were noted to be as low as 91/50. - Hold home blood pressure regimen - Goal MAP greater than 65 - Determine when medically appropriate to resume  Heart failure with mildly reduced ejection fraction. Patient appears to be euvolemic on physical exam.  Last echocardiogram noted EF to be 45 to 50% with indeterminate diastolic parameters. - Strict I&Os and daily weights - Hold Entresto  and spironolactone  due to AKI  Hypomagnesemia Acute.  Magnesium  level noted to be 1.4. - Give 2 g of magnesium  sulfate IV - Continue to monitor and replace as needed   DVT prophylaxis: Heparin   Advance Care Planning:   Code Status: Full Code    Consults: Nephrology  Family Communication: Daughter updated over the phone  Severity of Illness: The appropriate patient status for this patient is INPATIENT. Inpatient status is judged to be reasonable and necessary in order to provide the required intensity of service to ensure the patient's safety. The patient's presenting symptoms, physical exam findings, and initial radiographic and laboratory data in the context of their chronic comorbidities is felt to place them at high risk for further clinical deterioration. Furthermore, it is not anticipated that the patient will be medically stable for discharge from the hospital within 2 midnights of admission.   * I certify that at the point of admission it is my clinical judgment that the patient will require inpatient hospital care spanning beyond 2 midnights from the point of admission due to high intensity of service, high risk for further deterioration and high frequency of surveillance required.*  Author: Maximino DELENA Sharps, MD 10/17/2023 10:50 AM  For on call review [email or web address].

## 2023-10-18 DIAGNOSIS — N179 Acute kidney failure, unspecified: Secondary | ICD-10-CM | POA: Diagnosis not present

## 2023-10-18 DIAGNOSIS — E875 Hyperkalemia: Secondary | ICD-10-CM | POA: Diagnosis not present

## 2023-10-18 DIAGNOSIS — R55 Syncope and collapse: Secondary | ICD-10-CM

## 2023-10-18 DIAGNOSIS — I509 Heart failure, unspecified: Secondary | ICD-10-CM

## 2023-10-18 DIAGNOSIS — R197 Diarrhea, unspecified: Secondary | ICD-10-CM | POA: Diagnosis not present

## 2023-10-18 DIAGNOSIS — I340 Nonrheumatic mitral (valve) insufficiency: Secondary | ICD-10-CM

## 2023-10-18 DIAGNOSIS — I10 Essential (primary) hypertension: Secondary | ICD-10-CM

## 2023-10-18 LAB — BASIC METABOLIC PANEL WITH GFR
Anion gap: 5 (ref 5–15)
BUN: 42 mg/dL — ABNORMAL HIGH (ref 8–23)
CO2: 20 mmol/L — ABNORMAL LOW (ref 22–32)
Calcium: 8.5 mg/dL — ABNORMAL LOW (ref 8.9–10.3)
Chloride: 112 mmol/L — ABNORMAL HIGH (ref 98–111)
Creatinine, Ser: 1.36 mg/dL — ABNORMAL HIGH (ref 0.61–1.24)
GFR, Estimated: 54 mL/min — ABNORMAL LOW (ref >60)
Glucose, Bld: 90 mg/dL (ref 70–99)
Potassium: 5.6 mmol/L — ABNORMAL HIGH (ref 3.5–5.1)
Sodium: 137 mmol/L (ref 135–145)

## 2023-10-18 LAB — C DIFFICILE QUICK SCREEN W PCR REFLEX
C Diff antigen: NEGATIVE
C Diff interpretation: NOT DETECTED
C Diff toxin: NEGATIVE

## 2023-10-18 LAB — CBC
HCT: 31.9 % — ABNORMAL LOW (ref 39.0–52.0)
Hemoglobin: 11 g/dL — ABNORMAL LOW (ref 13.0–17.0)
MCH: 29.8 pg (ref 26.0–34.0)
MCHC: 34.5 g/dL (ref 30.0–36.0)
MCV: 86.4 fL (ref 80.0–100.0)
Platelets: 216 K/uL (ref 150–400)
RBC: 3.69 MIL/uL — ABNORMAL LOW (ref 4.22–5.81)
RDW: 12.9 % (ref 11.5–15.5)
WBC: 10.2 K/uL (ref 4.0–10.5)
nRBC: 0 % (ref 0.0–0.2)

## 2023-10-18 MED ORDER — SODIUM ZIRCONIUM CYCLOSILICATE 10 G PO PACK
10.0000 g | PACK | Freq: Two times a day (BID) | ORAL | Status: AC
  Administered 2023-10-18 (×2): 10 g via ORAL
  Filled 2023-10-18 (×2): qty 1

## 2023-10-18 MED ORDER — METOPROLOL TARTRATE 50 MG PO TABS
50.0000 mg | ORAL_TABLET | Freq: Two times a day (BID) | ORAL | Status: DC
  Administered 2023-10-18 – 2023-10-19 (×2): 50 mg via ORAL
  Filled 2023-10-18 (×2): qty 1

## 2023-10-18 MED ORDER — METOPROLOL TARTRATE 25 MG PO TABS
25.0000 mg | ORAL_TABLET | Freq: Two times a day (BID) | ORAL | Status: DC
  Administered 2023-10-18: 25 mg via ORAL
  Filled 2023-10-18: qty 1

## 2023-10-18 MED ORDER — FUROSEMIDE 10 MG/ML IJ SOLN
40.0000 mg | Freq: Once | INTRAMUSCULAR | Status: AC
  Administered 2023-10-18: 40 mg via INTRAVENOUS
  Filled 2023-10-18: qty 4

## 2023-10-18 NOTE — Consult Note (Addendum)
 Cardiology Consultation   Patient ID: GHAZI RUMPF MRN: [id number]; DOB: 04-Jan-1951  Admit date: 10/16/2023 Date of Consult: 10/18/2023  PCP:  Merilee, L.[name], MD (Inactive)   [facility] Providers Cardiologist:  [name], MD        Patient Profile: Dale Sanford is a 73 y.o. 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  History of Present Illness: Dale Sanford has followed with [facility] since 2015. He was first diagnosed with CHF in 2020 via an echocardiogram that was ordered to follow his AVR. He underwent a LHC 04/2018 which showed normal coronaries.A cMR was obtained afterwards which showed no evidence of infiltrative disease or infarct. He was placed on GDMT. Since then patient had issues hyperkalemia on spironolactone , this was discontinued. At his last visit with Olivia Pavy PA-C on 08/30/22, his GDMT included Toprol  XL and entresto . During this visit he was noted to be hypertensive, 174/68. He had endorsed a salty diet,and the plan was for him to make lifestyle changes via reducing salt in his diet. He was asymptomatic at that time. At time of nurse visit 08/2022, he was put on spironolactone  due to continued high blood pressure. Most recent echocardiogram 10/2022 LVEF 45-50% with global hypokinesis. Mild LVH of the basal-septal segment. Mild to moderate MR. AV replacement VTI 2.46 cm with mean gradient 3.0 mmHg. PV replacement mean PVG 5 mmHG.Mild dilatation of the ascending aorta 39 mm.  Presented to the ED on 7/28 after a near syncopal episode resulting in a fall. Patient stood, became lightheaded and fell. Patient had been experiencing two weeks of GI symptoms [nausea/emesis/diarrhea]. In the  ED patient noted to be hyperkalemic [K>7.5] with an AKI on CKD [3.31 with baseline ~1.3], nephrology was consulted and recommended cessation of entresto  and spironolactone . Patient received IVF coupled with 80mg  IV Lasix , insulin , bicarb, and lokelma .  Additional labs notable in the ED Mag 1.8, lactic acid 2.6, WBC 22 with elevated neutrophils,  CK 91 troponin 26 -> 23 ECG Sinus with 1st Degree AV block with TWI in I and avL with peaked T waves globally VR 81 Late R wave progression Has received IV antibiotics Hypomagnesemia yesterday. Has received repletion  He has remained tachycardic (sinus) with a soft BP during this admission. TRH put him on metoprolol  tartrate 25 mg BID for his tachycardia today, was on Toprol  200mg  PTA. He received IV lasix  40mg  today as well. Placed on ASA 81mg  today Labs today K 5.6, Cr 1.36, WBC 10.2, and Hgb 11 [ down from 13.4]  On interview patient shared he doing better since admission. When asked about the presyncopal episode he shared that he was at [employer] after he was at work when he became lightheaded and fell.  He reported that he has not been eating as well lately, he was unable to further characterize exactly why.  He shared a little bit about thinking his depression was playing into it, but was denying nausea though was having some emesis with eating.  He denied fever and chills.  He has a baseline cough.  He denied shortness of breath, chest pain, orthopnea, PND, and peripheral edema.   Patient shared that he has been using the restroom instead of the urinals.  I instructed him on using urinal over bathroom so we could better evaluate his output.  He shared that he had a lot of urine output after his IV Lasix  injection this morning.   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  Mild to modetate AI of the AVR autograft by  echo 11/2020   Avascular necrosis of hip, right (HCC) 02/18/2016   DCM (dilated cardiomyopathy) (HCC) 09/13/2017   EF 20-25% by echo with normal cors on cath.  Cardiac MRI with EF 40% 09/2018.   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 Authorizing Provider  aspirin  EC 81 MG tablet Take 81 mg by mouth daily.   Yes [provider]  metoprolol  (TOPROL -XL) 200 MG 24 hr tablet TAKE 1 TABLET BY MOUTH EVERY DAY 09/25/23  Yes Turner, Wilbert SAUNDERS, MD  sacubitril -valsartan  (ENTRESTO ) 97-103 MG Take 1 tablet by mouth 2 (two) times daily. 11/28/22  Yes Turner, Wilbert SAUNDERS, MD  spironolactone  (ALDACTONE ) 25 MG tablet TAKE 1 TABLET (25 MG TOTAL) BY MOUTH DAILY. 08/29/23  Yes Turner, Wilbert SAUNDERS, MD    Scheduled Meds:  aspirin  EC  81 mg Oral Daily   heparin   5,000 Units Subcutaneous Q8H   metoprolol  tartrate  25 mg Oral BID   sodium chloride  flush  3 mL Intravenous Q12H   sodium zirconium cyclosilicate   10 g Oral BID   Continuous Infusions:  PRN Meds: acetaminophen  **OR** acetaminophen , albuterol   Allergies:    Allergies  Allergen Reactions   Nyquil Multi-Symptom  [Pseudoeph-Doxylamine-Dm-Apap] Itching and Rash    Social History:   Social History   Socioeconomic History   Marital status: Married    Spouse name: Not on file   Number of children: Not on file   Years of education: Not on file   Highest education level: Not on file  Occupational History   Not on file  Tobacco Use   Smoking status: Never   Smokeless tobacco: Never  Vaping Use   Vaping status: Never Used  Substance and Sexual Activity   Alcohol  use: No    Comment: quit in 2008   Drug use: No   Sexual activity: Not on file  Other Topics Concern   Not on file  Social History Narrative   Not on file   Social Drivers of Health   Financial Resource Strain: Not on file  Food Insecurity: No Food Insecurity (10/17/2023)   Hunger Vital Sign    Worried About Running Out of Food in the Last Year: Never true  Ran Out of Food in the Last Year: Never true  Transportation Needs: No Transportation Needs (10/17/2023)   PRAPARE - Administrator, Civil Service (Medical): No    Lack of Transportation (Non-Medical): No  Physical Activity: Not on file  Stress: Not on file  Social Connections: Not on file  Intimate Partner Violence: Not At Risk (10/17/2023)   Humiliation, Afraid, Rape, and Kick questionnaire    Fear of Current or Ex-Partner: No    Emotionally Abused: No    Physically Abused: No    Sexually Abused: No    Family History:   Family History  Problem Relation Age of Onset   CAD Father        hardening of the arteries   Cirrhosis Brother    Cancer Brother      ROS:  Please see the history of present illness.  All other ROS reviewed and negative.     Physical Exam/Data: Vitals:   10/17/23 1928 10/17/23 2324 10/18/23 0258 10/18/23 0810  BP: (!) 107/51 (!) 105/53 (!) 126/48   Pulse: 100 87 91 82  Resp: 18 19 18 18   Temp: 98.8 F (37.1 C) 98.4 F (36.9 C) 97.8 F (36.6 C) 98 F (36.7 C)  TempSrc: Oral Oral Oral Oral  SpO2: 95% 96% 95% 99%   No  intake or output data in the 24 hours ending 10/18/23 1128    09/13/2022    2:28 PM 08/30/2022    9:30 AM 08/11/2021    3:13 PM  Last 3 Weights  Weight (lbs) 145 lb 146 lb 9.6 oz 142 lb 12.8 oz  Weight (kg) 65.772 kg 66.497 kg 64.774 kg     There is no height or weight on file to calculate BMI.  General:  Well nourished, well developed, in no acute distress HEENT: normal Vascular: No carotid bruits; Distal pulses 2+ bilaterally Cardiac: Normal rate, irregular rhythm, 1/6 soft systolic murmur without radiation Lungs: Wheezing and rhonchi bilaterally Abd: soft, nontender  Ext: no edema Musculoskeletal:  No deformities, BUE and BLE strength normal and equal Skin: warm and dry  Neuro:  CNs 2-12 intact, no focal abnormalities noted Psych:  Normal affect   EKG:  The EKG was personally reviewed and demonstrates: See HPI Telemetry:  Telemetry was personally reviewed and demonstrates: Sinus tachycardia with a heart rate over 100 until about 1115 today when his heart rate reduced to ~80  Relevant CV Studies: Echocardiogram 10/20/2022 IMPRESSIONS     1. Left ventricular ejection fraction, by estimation, is 45 to 50%. The  left ventricle has mildly decreased function. The left ventricle  demonstrates global hypokinesis. There is mild left ventricular  hypertrophy of the basal-septal segment. Left  ventricular diastolic parameters are indeterminate.   2. Right ventricular systolic function is normal. The right ventricular  size is normal. There is normal pulmonary artery systolic pressure.   3. The mitral valve is degenerative. Mild to moderate mitral valve  regurgitation. No evidence of mitral stenosis.   4. The aortic valve has been repaired/replaced. S/P Ross procedure with  native pulmonic valve in the aortic position. Aortic valve regurgitation  is moderate. No aortic stenosis is present. There is a bioprosthetic valve  present in the aortic position.  Regurgitation PHT measures 328  msec. Aortic valve area, by VTI measures  2.46 cm. Aortic valve mean gradient measures 3.0 mmHg. Aortic valve Vmax  measures 1.54 m/s.   5. S/P bioprosthetic PVR with mean PVG . No PR.  6. The inferior vena cava is normal in size with greater than 50%  respiratory variability, suggesting right atrial pressure of 3 mmHg.   7. Aortic dilatation noted. There is mild dilatation of the ascending  aorta, measuring 39 mm.   Laboratory Data: High Sensitivity Troponin:  No results for input(s): TROPONINIHS in the last 720 hours.   Chemistry Recent Labs  Lab 10/16/23 2002 10/16/23 2208 10/17/23 0153 10/17/23 1044 10/18/23 0537  NA 135   < > 137 138 137  K >7.5*   < > 5.5* 6.0* 5.6*  CL 105   < > 104 108 112*  CO2 14*   < > 16* 18* 20*  GLUCOSE 134*   < > 172* 107* 90  BUN 74*   < > 74* 64* 42*  CREATININE 3.31*   < > 2.72* 2.11* 1.36*  CALCIUM  9.8   < > 8.9 8.6* 8.5*  MG 1.8  --   --  1.4*  --   GFRNONAA 19*   < > 24* 32* 54*  ANIONGAP 17*   < > 17* 12 5   < > = values in this interval not displayed.    Recent Labs  Lab 10/16/23 2002 10/17/23 1044  PROT 7.8  --   ALBUMIN 4.6 3.2*  AST 19  --   ALT 22  --   ALKPHOS 68  --   BILITOT 0.4  --    Lipids No results for input(s): CHOL, TRIG, HDL, LABVLDL, LDLCALC, CHOLHDL in the last 168 hours.  Hematology Recent Labs  Lab 10/16/23 2002 10/18/23 0537  WBC 22.0* 10.2  RBC 4.50 3.69*  HGB 13.4 11.0*  HCT 39.1 31.9*  MCV 86.9 86.4  MCH 29.8 29.8  MCHC 34.3 34.5  RDW 13.2 12.9  PLT 355 216   Thyroid No results for input(s): TSH, FREET4 in the last 168 hours.  BNPNo results for input(s): BNP, PROBNP in the last 168 hours.  DDimer No results for input(s): DDIMER in the last 168 hours.  Radiology/Studies:  CT CHEST ABDOMEN PELVIS WO CONTRAST Result Date: 10/16/2023 CLINICAL DATA:  Clemens today. EXAM: CT CHEST, ABDOMEN AND PELVIS WITHOUT CONTRAST TECHNIQUE: Multidetector CT imaging of the chest,  abdomen and pelvis was performed following the standard protocol without IV contrast. RADIATION DOSE REDUCTION: This exam was performed according to the departmental dose-optimization program which includes automated exposure control, adjustment of the mA and/or kV according to patient size and/or use of iterative reconstruction technique. COMPARISON:  Remote CT scan from 2017 FINDINGS: CT CHEST FINDINGS Cardiovascular: The heart is normal in size. No pericardial effusion. The aorta is normal in caliber. No dissection. 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 Mediastinum/Nodes: No mediastinal or hilar mass or lymphadenopathy. The esophagus is unremarkable. Lungs/Pleura: No infiltrates, edema or effusions. No pulmonary contusion or pneumothorax. Find largely nodular peripheral interstitial process likely early interstitial lung disease. No bronchiectasis. The central tracheobronchial tree is unremarkable. No worrisome pulmonary nodules or pulmonary lesions. Musculoskeletal: No acute bony findings. No sternal, rib or thoracic vertebral body fractures. CT ABDOMEN PELVIS FINDINGS Hepatobiliary: Stable left hepatic lobe cyst. No worrisome hepatic lesions are identified without contrast. No intrahepatic biliary dilatation. The gallbladder is contracted. Gallstones suspected. No common bile duct dilatation. Pancreas: Unremarkable. No pancreatic ductal dilatation or surrounding inflammatory changes. Spleen: Normal in size without focal abnormality. Adrenals/Urinary Tract: The adrenal glands are normal. There are 2 exophytic lesions associated with the left kidney. Upper pole midpole junction lesion laterally measures 13 mm  and measures 32 Hounsfield units. The interpolar lesion posteromedially measures 10 mm and measures 30 Hounsfield units. Possible complex/hemorrhagic or proteinaceous cysts but could not exclude small solid lesions. Recommend follow-up MRI abdomen without and with contrast for further  evaluation. No renal calculi or hydronephrosis. The bladder is grossly normal. Stomach/Bowel: Stomach, duodenum, small and colon are grossly normal. No inflammatory process, mass lesion or obstructive findings. Vascular/Lymphatic: Moderate atherosclerotic calcifications involving the aorta and iliac arteries but no aneurysm. No abdominal or pelvic lymphadenopathy. No mesenteric or retroperitoneal. Reproductive: Enlarged prostate gland impressing on the base of the bladder. Other: No pelvic mass or adenopathy. No free pelvic fluid collections. No inguinal mass or adenopathy. Small periumbilical abdominal wall hernia containing fat. Small left inguinal hernia containing fat. Musculoskeletal: No acute bony findings. There is a stable remote compression fracture of L2. Right total hip arthroplasty noted. No complicating features. The pubic symphysis and SI joints are intact. No pelvic fractures. IMPRESSION: 1. No acute findings involving the chest, abdomen or pelvis. 2. 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 3. 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 4. Two exophytic lesions associated with the left kidney. Possible complex/hemorrhagic or proteinaceous cysts but could not exclude small solid lesions. Recommend follow-up MRI abdomen without and with contrast for further evaluation (non urgent). 5. Enlarged prostate gland impressing on the base of the bladder. Aortic Atherosclerosis (ICD10-I70.0). Electronically Signed   By: MYRTIS Stammer M.D.   On: 10/16/2023 22:42   CT Head Wo Contrast Result Date: 10/16/2023 CLINICAL DATA:  Head trauma, minor (Age >= 65y); Neck trauma (Age >= 65y) EXAM: CT HEAD WITHOUT CONTRAST CT CERVICAL SPINE WITHOUT CONTRAST TECHNIQUE: Multidetector CT imaging of the head and cervical spine was performed following the standard protocol without intravenous contrast. Multiplanar CT image reconstructions of the cervical spine were also generated. RADIATION DOSE  REDUCTION: This exam was performed according to the departmental dose-optimization program which includes automated exposure control, adjustment of the mA and/or kV according to patient size and/or use of iterative reconstruction technique. COMPARISON:  MRI head 11/19/1999 FINDINGS: CT HEAD FINDINGS Brain: No evidence of acute infarction, hemorrhage, hydrocephalus, extra-axial collection or mass lesion/mass effect. 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 Patchy white matter hypodensities, compatible with chronic microvascular ischemic disease. Vascular: No hyperdense vessel or unexpected calcification. Skull: No acute fracture. Sinuses/Orbits: No acute finding. CT CERVICAL SPINE FINDINGS Alignment: Normal. Skull base and vertebrae: No acute fracture. No primary bone lesion or focal pathologic process. Soft tissues and spinal canal: No prevertebral fluid or swelling. No visible canal hematoma. Disc levels: Moderate multilevel bony degenerative change. Facet and uncovertebral hypertrophy contributes to varying degrees of neural foraminal stenosis. Upper chest: Lung apices are clear. IMPRESSION: 1. No evidence of acute intracranial abnormality. 2. No evidence of acute fracture or traumatic malalignment in the cervical spine. 3. Remote infarcts and chronic microvascular ischemic disease. Electronically Signed   By: Gilmore GORMAN Molt M.D.   On: 10/16/2023 21:00   CT Cervical Spine Wo Contrast Result Date: 10/16/2023 CLINICAL DATA:  Head trauma, minor (Age >= 65y); Neck trauma (Age >= 65y) EXAM: CT HEAD WITHOUT CONTRAST CT CERVICAL SPINE WITHOUT CONTRAST TECHNIQUE: Multidetector CT imaging of the head and cervical spine was performed following the standard protocol without intravenous contrast. Multiplanar CT image reconstructions of the cervical spine were also generated. RADIATION DOSE REDUCTION: This exam was performed according to the departmental dose-optimization program  which includes automated exposure control, adjustment of the mA and/or  kV according to patient size and/or use of iterative reconstruction technique. COMPARISON:  MRI head 11/19/1999 FINDINGS: CT HEAD FINDINGS Brain: No evidence of acute infarction, hemorrhage, hydrocephalus, extra-axial collection or mass lesion/mass effect. 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 Patchy white matter hypodensities, compatible with chronic microvascular ischemic disease. Vascular: No hyperdense vessel or unexpected calcification. Skull: No acute fracture. Sinuses/Orbits: No acute finding. CT CERVICAL SPINE FINDINGS Alignment: Normal. Skull base and vertebrae: No acute fracture. No primary bone lesion or focal pathologic process. Soft tissues and spinal canal: No prevertebral fluid or swelling. No visible canal hematoma. Disc levels: Moderate multilevel bony degenerative change. Facet and uncovertebral hypertrophy contributes to varying degrees of neural foraminal stenosis. Upper chest: Lung apices are clear. IMPRESSION: 1. No evidence of acute intracranial abnormality. 2. No evidence of acute fracture or traumatic malalignment in the cervical spine. 3. Remote infarcts and chronic microvascular ischemic disease. Electronically Signed   By: Gilmore GORMAN Molt M.D.   On: 10/16/2023 21:00     Assessment and Plan: Chronic heart failure with reduced ejection fraction AKI on CKD Hyperkalemia Hypomagnesemia Patient has a history of NICM diagnosed in 2020 He has a history of hyperkalemia with spironolactone  use, was discontinued originally and restarted 05/2023 Patient had 2 weeks of low appetite and reduced p.o. intake, had a presyncopal episode and presented to the ED and found to be hyperkalemic (> 7.5) with AKI (Cr 3.31) on CKD (~1.3).  His Entresto  and spironolactone  were stopped.  Nephrology was following and signed off yesterday. Patient still receiving Lokelma , current K  5.6 Creatinine has returned to baseline (~1.38) Patient has received multiple liters of IV fluids for his hyperkalemia and lactic acidosis. Received 80mg  IV Lasix  7/28, 40mg  IV Lasix  7/30. On exam patient does not appear volume overloaded.  His I/O's are inaccurate as patient has not been using urinal.  I educated him on the importance of using the urinal as we give him IV diuresis. Would follow volume status through today before re-dosing. Note he was not on any maintenance Lasix  PTA. Patient is asymptomatic.   Continue metoprolol  tartrate 25 mg BID, this was started today. Plan to consolidate back to Toprol  at discharge though may be a lower dose than prior - of note, patient was on metoprolol  succinate 200 mg prior to this admission. Would continue to hold the Entresto  and spironolactone . Probably not a candidate for spironolactone  at all going forward with repeated history of hyperkalemia. Can reconsider ARB in the outpatient setting.   Patient's magnesium  was low yesterday, has received repletion.  Follow-up magnesium  pending.   Hypertension At his last office visit patient was noted to be hypertensive though endorses having a very salty diet Patient has been normotensive BP: 117/48  Will continue to hold Entresto  and spironolactone  given hyperkalemia and AKI Continue to titrate metoprolol  as tolerated  Aortic valve replacement via Ross procedure in 2001. Mild to moderate MR Would continue to monitor outpatient, patient currently has follow-up appointment with Dr. Shlomo 8/18  Per primary Leukocytosis Diarrhea   Risk Assessment/Risk Scores:       [place]  Heart Association (NYHA) Functional Class NYHA Class II    For questions or updates, please contact [facility] Please consult [email or web address] for contact info under    Signed, Leontine LOISE Salen, PA-C  10/18/2023 11:28 AM  Personally seen and examined. Agree with above.  73 year old male status post Ross  procedure (aortic valve replacement with native pulmonic valve) Hemashield graft of  aorta in 2001 with original EF secondary to nonischemic cardiomyopathy EF 20 to 25% currently 45 to 50% admitted with AKI hyperkalemia metabolic acidosis recurrent nausea vomiting anorexia.  After aggressive fluid hydration, Lokelma  for hyperkalemia as well as IV Lasix  dose, electrolytes have improved.  Creatinine went from 3.3 down to 1.3  His serum sodium remained 135-138.   -Can always consider testing for cortisol if adrenal insufficiency is suspected.  Serum sodium however remain normal.  However likely etiology GI illness with AKI  -For now continue with reinitiation of metoprolol  tartrate, continue up titration.  He was on 200 mg of Toprol  at home.  -Continue to hold spironolactone  especially with hyperkalemia -Continue to hold Entresto .  Valve remains stable.  Oneil Parchment, MD

## 2023-10-18 NOTE — Progress Notes (Signed)
 OT Cancellation Note  Patient Details Name: Dale Sanford MRN: [id number] DOB: 04-20-1950   Cancelled Treatment:    Reason Eval/Treat Not Completed: OT screened, no needs identified, will sign off (pt with no acute OT needs at this time per discussion with PT, will s/o. Please reconsult if there is a change in pt status.)  Yulieth Carrender K, OTD, OTR/L [facility] [phone]   Laneta POUR Koonce 10/18/2023, 4:06 PM

## 2023-10-18 NOTE — Progress Notes (Addendum)
 Subjective: Patient is eval bedside, sitting beside bed, no acute distress.  Reports overall doing well this morning, denies any nausea or vomiting or abdominal pain.  Reports he just had a bowel movement, feels well.  Reports that he is tolerating p.o. intake well.  No other concerns this morning.  Objective Vital signs in last 24 hours: Vitals:   10/17/23 1928 10/17/23 2324 10/18/23 0258 10/18/23 0810  BP: (!) 107/51 (!) 105/53 (!) 126/48   Pulse: 100 87 91   Resp: 18 19 18  (P) 18  Temp: 98.8 F (37.1 C) 98.4 F (36.9 C) 97.8 F (36.6 C) (P) 98 F (36.7 C)  TempSrc: Oral Oral Oral (P) Oral  SpO2: 95% 96% 95% (P) 99%   Weight change:  No intake or output data in the 24 hours ending 10/18/23 0829  Pt is a 73 y.o. yo male who was admitted on 10/16/2023 with hyperkalemia secondary to medications, decreased p.o. intake, GI losses. PMHx of HTN, hx of AVR, and NICM who [facility] with hyperkalemia, K >7.5, bicarb 14, Scr 3.31 and BUN 74, on-call nephrologist was consulted, patient received IVF, insulin  dextrose , bicarb, Lokelma . In addition, patient also received IV lasix  80 mg, and 1 L LR. Labs improved after the intervention. Etiology was 2/2 entresto  and aldactone  in the setting of AKI. Patient and family at bedside reports symptoms of feeling dizzy and weak for the past couple of days, had a near syncope/fall. No head trauma or LOC. Reports 2 weeks ago, he had a stomach bug with nausea, vomiting and diarrhea; from which diarrhea has persisted. Reports chronic decrease PO intake. No fevers or chills.   Assessment/Plan: Hyperkalemia  AGMA  AKI, baseline Scr ~1.38  Presented to [facility] ED with K 7.5, bicarb 14, BUN 74, Scr 3.31, AG 17. CT without acute renal calculi or hydronephrosis.  Without acute obstruction. Patient was immediately treated with IVF, insulin  dextrose , bicarb, Lokelma  with addition of IV lasix  80 m. Metabolic panel improved with these  interventions. Etiology 2/2 Entresto , aldactone , decrease PO intake and GI losses. No need for emergent dialysis. - Potassium 5.6 (6.0); needs to be on a renal diet.  I did more education today about what foods to avoid that are high in potassium.  He had [place] fries and mashed potatoes yesterday.  He could use a dietary consult to further educate him about foods that are high in phosphorus and potassium especially to avoid.  - Lokelma  10 g BID today; so gave Lasix  x 1  Signing off at this time; please reconsult as needed.  - Encourage PO hydration  - STOP Entresto  and Aldactone , patient is advised to follow up with PCP and Cardiologist after discharge  - CT imaging did note 2 exophytic lesion associated with the left kidney, possible complex/hemorrhagic/proteinaceous cyst, could not exclude small solid lesions. Recommend follow-up MRI abdomen w/wo contrast in the outpatient setting.   HTN Blood pressures stable. Continue to hold Entresto  and aldactone .   I have independently taken a history, reviewed the chart and examined the patient. This patient encounter includes evaluation, adjustment of therapies in at least one of the key components with review of  Dr. Laray note, impression and recommendations.    I've also already edited and made adjustments to the above mentioned note.  [name], MD 10/18/2023, 12:24 PM  Labs: Basic Metabolic Panel: Recent Labs  Lab 10/17/23 0153 10/17/23 1044 10/18/23 0537  NA 137 138 137  K 5.5* 6.0*  5.6*  CL 104 108 112*  CO2 16* 18* 20*  GLUCOSE 172* 107* 90  BUN 74* 64* 42*  CREATININE 2.72* 2.11* 1.36*  CALCIUM  8.9 8.6* 8.5*  PHOS  --  4.2  --    Liver Function Tests: Recent Labs  Lab 10/16/23 2002 10/17/23 1044  AST 19  --   ALT 22  --   ALKPHOS 68  --   BILITOT 0.4  --   PROT 7.8  --   ALBUMIN 4.6 3.2*   No results for input(s): LIPASE, AMYLASE in the last 168 hours. No results for input(s): AMMONIA in the last 168  hours. CBC: Recent Labs  Lab 10/16/23 2002 10/18/23 0537  WBC 22.0* 10.2  NEUTROABS 19.1*  --   HGB 13.4 11.0*  HCT 39.1 31.9*  MCV 86.9 86.4  PLT 355 216   Cardiac Enzymes: Recent Labs  Lab 10/16/23 2002  CKTOTAL 91   CBG: Recent Labs  Lab 10/16/23 2324  GLUCAP 259*    Iron Studies: No results for input(s): IRON, TIBC, TRANSFERRIN, FERRITIN in the last 72 hours. Studies/Results: CT CHEST ABDOMEN PELVIS WO CONTRAST Result Date: 10/16/2023 CLINICAL DATA:  Clemens today. EXAM: CT CHEST, ABDOMEN AND PELVIS WITHOUT CONTRAST TECHNIQUE: Multidetector CT imaging of the chest, abdomen and pelvis was performed following the standard protocol without IV contrast. RADIATION DOSE REDUCTION: This exam was performed according to the departmental dose-optimization program which includes automated exposure control, adjustment of the mA and/or kV according to patient size and/or use of iterative reconstruction technique. COMPARISON:  Remote CT scan from 2017 FINDINGS: CT CHEST FINDINGS Cardiovascular: The heart is normal in size. No pericardial effusion. The aorta is normal in caliber. No dissection. 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 Mediastinum/Nodes: No mediastinal or hilar mass or lymphadenopathy. The esophagus is unremarkable. Lungs/Pleura: No infiltrates, edema or effusions. No pulmonary contusion or pneumothorax. Find largely nodular peripheral interstitial process likely early interstitial lung disease. No bronchiectasis. The central tracheobronchial tree is unremarkable. No worrisome pulmonary nodules or pulmonary lesions. Musculoskeletal: No acute bony findings. No sternal, rib or thoracic vertebral body fractures. CT ABDOMEN PELVIS FINDINGS Hepatobiliary: Stable left hepatic lobe cyst. No worrisome hepatic lesions are identified without contrast. No intrahepatic biliary dilatation. The gallbladder is contracted. Gallstones suspected. No common bile duct dilatation.  Pancreas: Unremarkable. No pancreatic ductal dilatation or surrounding inflammatory changes. Spleen: Normal in size without focal abnormality. Adrenals/Urinary Tract: The adrenal glands are normal. There are 2 exophytic lesions associated with the left kidney. Upper pole midpole junction lesion laterally measures 13 mm and measures 32 Hounsfield units. The interpolar lesion posteromedially measures 10 mm and measures 30 Hounsfield units. Possible complex/hemorrhagic or proteinaceous cysts but could not exclude small solid lesions. Recommend follow-up MRI abdomen without and with contrast for further evaluation. No renal calculi or hydronephrosis. The bladder is grossly normal. Stomach/Bowel: Stomach, duodenum, small and colon are grossly normal. No inflammatory process, mass lesion or obstructive findings. Vascular/Lymphatic: Moderate atherosclerotic calcifications involving the aorta and iliac arteries but no aneurysm. No abdominal or pelvic lymphadenopathy. No mesenteric or retroperitoneal. Reproductive: Enlarged prostate gland impressing on the base of the bladder. Other: No pelvic mass or adenopathy. No free pelvic fluid collections. No inguinal mass or adenopathy. Small periumbilical abdominal wall hernia containing fat. Small left inguinal hernia containing fat. Musculoskeletal: No acute bony findings. There is a stable remote compression fracture of L2. Right total hip arthroplasty noted. No complicating features. The pubic symphysis and SI joints are intact. No  pelvic fractures. IMPRESSION: 1. No acute findings involving the chest, abdomen or pelvis. 2. 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 3. 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 4. Two exophytic lesions associated with the left kidney. Possible complex/hemorrhagic or proteinaceous cysts but could not exclude small solid lesions. Recommend follow-up MRI abdomen without and with contrast for further evaluation (non urgent).  5. Enlarged prostate gland impressing on the base of the bladder. Aortic Atherosclerosis (ICD10-I70.0). Electronically Signed   By: MYRTIS Stammer M.D.   On: 10/16/2023 22:42   CT Head Wo Contrast Result Date: 10/16/2023 CLINICAL DATA:  Head trauma, minor (Age >= 65y); Neck trauma (Age >= 65y) EXAM: CT HEAD WITHOUT CONTRAST CT CERVICAL SPINE WITHOUT CONTRAST TECHNIQUE: Multidetector CT imaging of the head and cervical spine was performed following the standard protocol without intravenous contrast. Multiplanar CT image reconstructions of the cervical spine were also generated. RADIATION DOSE REDUCTION: This exam was performed according to the departmental dose-optimization program which includes automated exposure control, adjustment of the mA and/or kV according to patient size and/or use of iterative reconstruction technique. COMPARISON:  MRI head 11/19/1999 FINDINGS: CT HEAD FINDINGS Brain: No evidence of acute infarction, hemorrhage, hydrocephalus, extra-axial collection or mass lesion/mass effect. 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 Patchy white matter hypodensities, compatible with chronic microvascular ischemic disease. Vascular: No hyperdense vessel or unexpected calcification. Skull: No acute fracture. Sinuses/Orbits: No acute finding. CT CERVICAL SPINE FINDINGS Alignment: Normal. Skull base and vertebrae: No acute fracture. No primary bone lesion or focal pathologic process. Soft tissues and spinal canal: No prevertebral fluid or swelling. No visible canal hematoma. Disc levels: Moderate multilevel bony degenerative change. Facet and uncovertebral hypertrophy contributes to varying degrees of neural foraminal stenosis. Upper chest: Lung apices are clear. IMPRESSION: 1. No evidence of acute intracranial abnormality. 2. No evidence of acute fracture or traumatic malalignment in the cervical spine. 3. Remote infarcts and chronic microvascular ischemic disease.  Electronically Signed   By: Gilmore GORMAN Molt M.D.   On: 10/16/2023 21:00   CT Cervical Spine Wo Contrast Result Date: 10/16/2023 CLINICAL DATA:  Head trauma, minor (Age >= 65y); Neck trauma (Age >= 65y) EXAM: CT HEAD WITHOUT CONTRAST CT CERVICAL SPINE WITHOUT CONTRAST TECHNIQUE: Multidetector CT imaging of the head and cervical spine was performed following the standard protocol without intravenous contrast. Multiplanar CT image reconstructions of the cervical spine were also generated. RADIATION DOSE REDUCTION: This exam was performed according to the departmental dose-optimization program which includes automated exposure control, adjustment of the mA and/or kV according to patient size and/or use of iterative reconstruction technique. COMPARISON:  MRI head 11/19/1999 FINDINGS: CT HEAD FINDINGS Brain: No evidence of acute infarction, hemorrhage, hydrocephalus, extra-axial collection or mass lesion/mass effect. 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 Patchy white matter hypodensities, compatible with chronic microvascular ischemic disease. Vascular: No hyperdense vessel or unexpected calcification. Skull: No acute fracture. Sinuses/Orbits: No acute finding. CT CERVICAL SPINE FINDINGS Alignment: Normal. Skull base and vertebrae: No acute fracture. No primary bone lesion or focal pathologic process. Soft tissues and spinal canal: No prevertebral fluid or swelling. No visible canal hematoma. Disc levels: Moderate multilevel bony degenerative change. Facet and uncovertebral hypertrophy contributes to varying degrees of neural foraminal stenosis. Upper chest: Lung apices are clear. IMPRESSION: 1. No evidence of acute intracranial abnormality. 2. No evidence of acute fracture or traumatic malalignment in the cervical spine. 3. Remote infarcts and chronic microvascular ischemic disease. Electronically Signed  By: Gilmore GORMAN Molt M.D.   On: 10/16/2023 21:00     Medications: Infusions: None  Scheduled Medications:  aspirin  EC  81 mg Oral Daily   heparin   5,000 Units Subcutaneous Q8H   sodium chloride  flush  3 mL Intravenous Q12H   sodium zirconium cyclosilicate   10 g Oral BID    have reviewed scheduled and prn medications.  Physical Exam: General: Sitting beside bed, no acute distress Heart: Tachycardic Lungs: Breathing comfortably Abdomen: Soft, nontender, nondistended Extremities: Warm to touch, no lower extremity edema Dialysis Access: None   10/18/2023,8:29 AM  LOS: 1 day

## 2023-10-18 NOTE — Progress Notes (Addendum)
 [facility]                                                                              Tyr Franca, is a 73 y.o. male, DOB - 09-23-1950, FMW:[id number] Admit date - 10/16/2023    Outpatient Primary MD for the patient is Merilee, L.[name], MD (Inactive)  LOS - 1  days  Chief Complaint  Patient presents with   Fall       Brief summary   Patient is a 73 y.o. male with hypertension, nonischemic cardiomyopathy, HFrEF (echo EF 45-50% in 2024) , aortic valve insufficiency s/p AVR, and GERD presented with dizziness and weakness. He experienced dizziness and weakness after getting off work and attempting to eat, describing a sensation of 'real dizzy' prior to falling to the floor. This episode occurred yesterday, and he had not eaten all day prior to the event. The patient noted that it was hot at the time.  He is on GDMT for sCHF, including aspirin , BB, Entresto  and spironolactone , with no recent changes. He  experienced a 'runny' stool a day before admisison but not diarrhea. Occasional stomach pains are reported, but no blood in stools. He has not been on antibiotics recently. He reports frequent urination, getting up three to four times a night to use the bathroom, which is not a new symptom. No pain with urination is noted. No recent cough, fever, or smoking history. He does not consume alcohol . His two brothers smoke, but he does not.  In ED BP soft 91/52, HR 137. Cr 3.31, WBC 22. K >7.5  Renal consultted  Patient had been given 3 L of lactated Ringer 's, insulin , dextrose , amp of sodium bicarb, calcium  gluconate, albuterol  nebs, Lokelma ,  Lasix  80 mg IV, Rocephin , and metronidazole  IV.   Assessment & Plan     Acute kidney injury superimposed on chronic kidney disease stage IIIb Hyperkalemia, AGMA  - In ED, K > 7.5, sodium bicarb 14, BUN 74, creatinine 3.31, and anion gap of 17.  EKG noted signs of T wave peaking.   -  Creatinine was 1.38 on 09/21/2022.   - renal  consulted,  treated with IV fluids, insulin , dextrose , sodium bicarb, Lokelma , 3 L of IV fluids, and Lasix  80 mg IV.   - Improving, entresto , aldactone  stopped      Leukocytosis, Lactic acidosis Acute.  WBC elevated at 22 with initial lactic acid of 2.6.  Patient had reported having a mild cough.  CT scan of the chest abdomen pelvis did not reveal any acute abnormality with early changes of interstitial lung disease.   - leukocytosis resolved, possible stress demargination, PCT < 0.1 - flu, RSV, covid negative   - IV antibiotics dc'ed     Transient hypotension, near syncope  - BP now stable and tachycardiac - resume BB, placed on low dose lopressor  25mg  BID (outpt on toprol  XL) - cardiology consulted   Heart failure with mildly reduced ejection fraction. Patient appears to be euvolemic on physical exam.  Last echocardiogram noted EF to be 45 to 50% with indeterminate diastolic parameters. - Hold Entresto  and spironolactone  due to AKI - received lasix  40mg   IV x 1  - needs cardiology follow-up, consulted    Hypomagnesemia Acute.  - received Mag on 7/29, will recheck   Diarrhea - will check GIP, c diff     Estimated body mass index is 26.52 kg/m as calculated from the following:   Height as of 09/13/22: 5' 2 (1.575 m).   Weight as of 09/13/22: 65.8 kg.  Code Status: full  DVT Prophylaxis:  heparin  injection 5,000 Units Start: 10/17/23 1400   Level of Care: Level of care: Progressive Family Communication: Updated patient Disposition Plan:      Remains inpatient appropriate:   hopefully dc in am    Procedures:    Consultants:   Renal Cardiology   Antimicrobials:   Anti-infectives (From admission, onward)    Start     Dose/Rate Route Frequency Ordered Stop   10/16/23 2315  cefTRIAXone  (ROCEPHIN ) 2 g in sodium chloride  0.9 % 100 mL IVPB       Placed in And Linked Group   2 g 200 mL/hr over 30 Minutes Intravenous  Once 10/16/23 2307 10/17/23 0022   10/16/23 2315   metroNIDAZOLE  (FLAGYL ) IVPB 500 mg       Placed in And Linked Group   500 mg 100 mL/hr over 60 Minutes Intravenous  Once 10/16/23 2307 10/17/23 0125          Medications  aspirin  EC  81 mg Oral Daily   heparin   5,000 Units Subcutaneous Q8H   metoprolol  tartrate  25 mg Oral BID   sodium chloride  flush  3 mL Intravenous Q12H   sodium zirconium cyclosilicate   10 g Oral BID      Subjective:   Lucius Wise was seen and examined today.  No acute complaints except diarrhea, no fevers, and pain.   Patient denies dizziness, chest pain, shortness of breath, abdominal pain.    Objective:   Vitals:   10/17/23 1928 10/17/23 2324 10/18/23 0258 10/18/23 0810  BP: (!) 107/51 (!) 105/53 (!) 126/48   Pulse: 100 87 91 82  Resp: 18 19 18 18   Temp: 98.8 F (37.1 C) 98.4 F (36.9 C) 97.8 F (36.6 C) 98 F (36.7 C)  TempSrc: Oral Oral Oral Oral  SpO2: 95% 96% 95% 99%   No intake or output data in the 24 hours ending 10/18/23 1043   Wt Readings from Last 3 Encounters:  09/13/22 65.8 kg  08/30/22 66.5 kg  08/11/21 64.8 kg     Exam General: Alert and oriented x 3, NAD Cardiovascular: S1 S2 auscultated,  RRR Respiratory: dec BS at bases  Gastrointestinal: Soft, nontender, nondistended, + bowel sounds Ext: no pedal edema bilaterally Neuro: No new deficits  Psych: Normal affect     Data Reviewed:  I have personally reviewed following labs    CBC Lab Results  Component Value Date   WBC 10.2 10/18/2023   RBC 3.69 (L) 10/18/2023   HGB 11.0 (L) 10/18/2023   HCT 31.9 (L) 10/18/2023   MCV 86.4 10/18/2023   MCH 29.8 10/18/2023   PLT 216 10/18/2023   MCHC 34.5 10/18/2023   RDW 12.9 10/18/2023   LYMPHSABS 1.9 10/16/2023   MONOABS 0.8 10/16/2023   EOSABS 0.0 10/16/2023   BASOSABS 0.0 10/16/2023     Last metabolic panel Lab Results  Component Value Date   NA 137 10/18/2023   K 5.6 (H) 10/18/2023   CL 112 (H) 10/18/2023   CO2 20 (L) 10/18/2023   BUN 42 (H)  10/18/2023   CREATININE  1.36 (H) 10/18/2023   GLUCOSE 90 10/18/2023   GFRNONAA 54 (L) 10/18/2023   GFRAA 78 05/28/2019   CALCIUM  8.5 (L) 10/18/2023   PHOS 4.2 10/17/2023   PROT 7.8 10/16/2023   ALBUMIN 3.2 (L) 10/17/2023   LABGLOB 2.3 05/24/2018   AGRATIO 1.7 05/24/2018   BILITOT 0.4 10/16/2023   ALKPHOS 68 10/16/2023   AST 19 10/16/2023   ALT 22 10/16/2023   ANIONGAP 5 10/18/2023    CBG (last 3)  Recent Labs    10/16/23 2324  GLUCAP 259*      Coagulation Profile: No results for input(s): INR, PROTIME in the last 168 hours.   Radiology Studies: I have personally reviewed the imaging studies  CT CHEST ABDOMEN PELVIS WO CONTRAST Result Date: 10/16/2023 CLINICAL DATA:  Clemens today. EXAM: CT CHEST, ABDOMEN AND PELVIS WITHOUT CONTRAST TECHNIQUE: Multidetector CT imaging of the chest, abdomen and pelvis was performed following the standard protocol without IV contrast. RADIATION DOSE REDUCTION: This exam was performed according to the departmental dose-optimization program which includes automated exposure control, adjustment of the mA and/or kV according to patient size and/or use of iterative reconstruction technique. COMPARISON:  Remote CT scan from 2017 FINDINGS: CT CHEST FINDINGS Cardiovascular: The heart is normal in size. No pericardial effusion. The aorta is normal in caliber. No dissection. 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 Mediastinum/Nodes: No mediastinal or hilar mass or lymphadenopathy. The esophagus is unremarkable. Lungs/Pleura: No infiltrates, edema or effusions. No pulmonary contusion or pneumothorax. Find largely nodular peripheral interstitial process likely early interstitial lung disease. No bronchiectasis. The central tracheobronchial tree is unremarkable. No worrisome pulmonary nodules or pulmonary lesions. Musculoskeletal: No acute bony findings. No sternal, rib or thoracic vertebral body fractures. CT ABDOMEN PELVIS FINDINGS Hepatobiliary:  Stable left hepatic lobe cyst. No worrisome hepatic lesions are identified without contrast. No intrahepatic biliary dilatation. The gallbladder is contracted. Gallstones suspected. No common bile duct dilatation. Pancreas: Unremarkable. No pancreatic ductal dilatation or surrounding inflammatory changes. Spleen: Normal in size without focal abnormality. Adrenals/Urinary Tract: The adrenal glands are normal. There are 2 exophytic lesions associated with the left kidney. Upper pole midpole junction lesion laterally measures 13 mm and measures 32 Hounsfield units. The interpolar lesion posteromedially measures 10 mm and measures 30 Hounsfield units. Possible complex/hemorrhagic or proteinaceous cysts but could not exclude small solid lesions. Recommend follow-up MRI abdomen without and with contrast for further evaluation. No renal calculi or hydronephrosis. The bladder is grossly normal. Stomach/Bowel: Stomach, duodenum, small and colon are grossly normal. No inflammatory process, mass lesion or obstructive findings. Vascular/Lymphatic: Moderate atherosclerotic calcifications involving the aorta and iliac arteries but no aneurysm. No abdominal or pelvic lymphadenopathy. No mesenteric or retroperitoneal. Reproductive: Enlarged prostate gland impressing on the base of the bladder. Other: No pelvic mass or adenopathy. No free pelvic fluid collections. No inguinal mass or adenopathy. Small periumbilical abdominal wall hernia containing fat. Small left inguinal hernia containing fat. Musculoskeletal: No acute bony findings. There is a stable remote compression fracture of L2. Right total hip arthroplasty noted. No complicating features. The pubic symphysis and SI joints are intact. No pelvic fractures. IMPRESSION: 1. No acute findings involving the chest, abdomen or pelvis. 2. 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 3. 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 4. Two exophytic lesions  associated with the left kidney. Possible complex/hemorrhagic or proteinaceous cysts but could not exclude small solid lesions. Recommend follow-up MRI abdomen without and with contrast for further evaluation (non urgent). 5. Enlarged prostate  gland impressing on the base of the bladder. Aortic Atherosclerosis (ICD10-I70.0). Electronically Signed   By: MYRTIS Stammer M.D.   On: 10/16/2023 22:42   CT Head Wo Contrast Result Date: 10/16/2023 CLINICAL DATA:  Head trauma, minor (Age >= 65y); Neck trauma (Age >= 65y) EXAM: CT HEAD WITHOUT CONTRAST CT CERVICAL SPINE WITHOUT CONTRAST TECHNIQUE: Multidetector CT imaging of the head and cervical spine was performed following the standard protocol without intravenous contrast. Multiplanar CT image reconstructions of the cervical spine were also generated. RADIATION DOSE REDUCTION: This exam was performed according to the departmental dose-optimization program which includes automated exposure control, adjustment of the mA and/or kV according to patient size and/or use of iterative reconstruction technique. COMPARISON:  MRI head 11/19/1999 FINDINGS: CT HEAD FINDINGS Brain: No evidence of acute infarction, hemorrhage, hydrocephalus, extra-axial collection or mass lesion/mass effect. 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 Patchy white matter hypodensities, compatible with chronic microvascular ischemic disease. Vascular: No hyperdense vessel or unexpected calcification. Skull: No acute fracture. Sinuses/Orbits: No acute finding. CT CERVICAL SPINE FINDINGS Alignment: Normal. Skull base and vertebrae: No acute fracture. No primary bone lesion or focal pathologic process. Soft tissues and spinal canal: No prevertebral fluid or swelling. No visible canal hematoma. Disc levels: Moderate multilevel bony degenerative change. Facet and uncovertebral hypertrophy contributes to varying degrees of neural foraminal stenosis. Upper chest: Lung  apices are clear. IMPRESSION: 1. No evidence of acute intracranial abnormality. 2. No evidence of acute fracture or traumatic malalignment in the cervical spine. 3. Remote infarcts and chronic microvascular ischemic disease. Electronically Signed   By: Gilmore GORMAN Molt M.D.   On: 10/16/2023 21:00   CT Cervical Spine Wo Contrast Result Date: 10/16/2023 CLINICAL DATA:  Head trauma, minor (Age >= 65y); Neck trauma (Age >= 65y) EXAM: CT HEAD WITHOUT CONTRAST CT CERVICAL SPINE WITHOUT CONTRAST TECHNIQUE: Multidetector CT imaging of the head and cervical spine was performed following the standard protocol without intravenous contrast. Multiplanar CT image reconstructions of the cervical spine were also generated. RADIATION DOSE REDUCTION: This exam was performed according to the departmental dose-optimization program which includes automated exposure control, adjustment of the mA and/or kV according to patient size and/or use of iterative reconstruction technique. COMPARISON:  MRI head 11/19/1999 FINDINGS: CT HEAD FINDINGS Brain: No evidence of acute infarction, hemorrhage, hydrocephalus, extra-axial collection or mass lesion/mass effect. 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 Patchy white matter hypodensities, compatible with chronic microvascular ischemic disease. Vascular: No hyperdense vessel or unexpected calcification. Skull: No acute fracture. Sinuses/Orbits: No acute finding. CT CERVICAL SPINE FINDINGS Alignment: Normal. Skull base and vertebrae: No acute fracture. No primary bone lesion or focal pathologic process. Soft tissues and spinal canal: No prevertebral fluid or swelling. No visible canal hematoma. Disc levels: Moderate multilevel bony degenerative change. Facet and uncovertebral hypertrophy contributes to varying degrees of neural foraminal stenosis. Upper chest: Lung apices are clear. IMPRESSION: 1. No evidence of acute intracranial abnormality. 2. No  evidence of acute fracture or traumatic malalignment in the cervical spine. 3. Remote infarcts and chronic microvascular ischemic disease. Electronically Signed   By: Gilmore GORMAN Molt M.D.   On: 10/16/2023 21:00       Imojean Yoshino M.D. [facility] 10/18/2023, 10:43 AM  Available via Epic secure chat 7am-7pm After 7 pm, please refer to night coverage provider listed on amion.

## 2023-10-18 NOTE — Plan of Care (Signed)

## 2023-10-18 NOTE — Evaluation (Addendum)
 Physical Therapy Brief Evaluation and Discharge Note Patient Details Name: Dale Sanford MRN: [id number] DOB: 11/06/1950 Today's Date: 10/18/2023   History of Present Illness  Pt is a 73 y.o. M who presents 10/16/2023 with dizziness and weakness. Admitted with acute kidney injury superimposed on chronic kidney disease stage IIIb, leukocytosis, lactic acidosis. CT scan of the chest abdomen pelvis did not reveal any acute abnormality with early changes of interstitial lung disease.  Significant PMH:  hypertension, nonischemic cardiomyopathy, HFrEF (echo EF 45-50% in 2024) , aortic valve insufficiency s/p AVR, and GERD.  Clinical Impression  Patient evaluated by Physical Therapy with no further acute PT needs identified. 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 Pt received standing in room. Pt ambulating 450 ft with no assistive device and negotiated 2 steps independently. BP 113/70 (78) post mobility. Pt denies dizziness or lightheadedness. All education has been completed and the patient has no further questions. No follow-up Physical Therapy or equipment needs. PT is signing off. Thank you for this referral.      PT Assessment Patient does not need any further PT services  Assistance Needed at Discharge  None    Equipment Recommendations None recommended by PT  Recommendations for Other Services       Precautions/Restrictions Precautions Precautions: None Restrictions Weight Bearing Restrictions Per Provider Order: No        Mobility  Bed Mobility       General bed mobility comments: OOB upon entry  Transfers Overall transfer level: Independent Equipment used: None                    Ambulation/Gait Ambulation/Gait assistance: Independent Gait Distance (Feet): 450 Feet Assistive device: None Gait Pattern/deviations: WFL(Within Functional Limits)   General Gait Details: Some difficulty with dual tasking noted, tends to stop when  conversing  Home Activity Instructions    Stairs Stairs: Yes Stairs assistance: Modified independent (Device/Increase time) Stair Management: One rail Right Number of Stairs: 2    Modified Rankin (Stroke Patients Only)        Balance Overall balance assessment: No apparent balance deficits (not formally assessed)                        Pertinent Vitals/Pain PT - Brief Vital Signs All Vital Signs Stable: Yes Pain Assessment Pain Assessment: No/denies pain     Home Living Family/patient expects to be discharged to:: Private residence Living Arrangements: Children (daughter) Available Help at Discharge: Family Home Environment: Stairs to enter  [employer] of Steps:  (onto deck, a few,)          Prior Function Level of Independence: Independent Comments: works in Production designer, theatre/television/film    UE/LE Assessment   UE ROM/Strength/Tone/Coordination: [employer]    LE ROM/Strength/Tone/Coordination: [employer]      Communication   Communication Communication: Impaired Factors Affecting Communication: Hearing impaired     Cognition Overall Cognitive Status: Appears within functional limits for tasks assessed/performed       General Comments      Exercises     Assessment/Plan    PT Problem List         PT Visit Diagnosis Dizziness and giddiness (R42)    No Skilled PT All education completed;Patient at baseline level of functioning;Patient is independent with all acitivity/mobility   Co-evaluation                AMPAC 6 Clicks Help needed turning from your back to  your side while in a flat bed without using bedrails?: None Help needed moving from lying on your back to sitting on the side of a flat bed without using bedrails?: None Help needed moving to and from a bed to a chair (including a wheelchair)?: None Help needed standing up from a chair using your arms (e.g., wheelchair or bedside chair)?: None Help needed to walk in hospital room?: None   6  Click Score: 20      End of Session   Activity Tolerance: Patient tolerated treatment well Patient left: in bed;with call bell/phone within reach Nurse Communication: Mobility status PT Visit Diagnosis: Dizziness and giddiness (R42)     Time: 8457-8442 PT Time Calculation (min) (ACUTE ONLY): 15 min  Charges:   PT Evaluation $PT Eval Low Complexity: 1 Low      Aleck Sanford, PT, DPT Acute Rehabilitation Services Office [phone]   Dale Sanford  10/18/2023, 4:10 PM

## 2023-10-19 ENCOUNTER — Other Ambulatory Visit (HOSPITAL_COMMUNITY): Payer: Self-pay

## 2023-10-19 DIAGNOSIS — E875 Hyperkalemia: Secondary | ICD-10-CM | POA: Diagnosis not present

## 2023-10-19 DIAGNOSIS — R55 Syncope and collapse: Secondary | ICD-10-CM | POA: Diagnosis not present

## 2023-10-19 DIAGNOSIS — N179 Acute kidney failure, unspecified: Secondary | ICD-10-CM | POA: Diagnosis not present

## 2023-10-19 DIAGNOSIS — R197 Diarrhea, unspecified: Secondary | ICD-10-CM | POA: Diagnosis not present

## 2023-10-19 LAB — GASTROINTESTINAL PANEL BY PCR, STOOL (REPLACES STOOL CULTURE)

## 2023-10-19 LAB — CBC
HCT: 32.2 % — ABNORMAL LOW (ref 39.0–52.0)
Hemoglobin: 11.1 g/dL — ABNORMAL LOW (ref 13.0–17.0)
MCH: 29.7 pg (ref 26.0–34.0)
MCHC: 34.5 g/dL (ref 30.0–36.0)
MCV: 86.1 fL (ref 80.0–100.0)
Platelets: 215 K/uL (ref 150–400)
RBC: 3.74 MIL/uL — ABNORMAL LOW (ref 4.22–5.81)
RDW: 12.5 % (ref 11.5–15.5)
WBC: 9 K/uL (ref 4.0–10.5)
nRBC: 0 % (ref 0.0–0.2)

## 2023-10-19 LAB — RENAL FUNCTION PANEL
Albumin: 3.1 g/dL — ABNORMAL LOW (ref 3.5–5.0)
Anion gap: 8 (ref 5–15)
BUN: 33 mg/dL — ABNORMAL HIGH (ref 8–23)
CO2: 22 mmol/L (ref 22–32)
Calcium: 8.4 mg/dL — ABNORMAL LOW (ref 8.9–10.3)
Chloride: 110 mmol/L (ref 98–111)
Creatinine, Ser: 1.35 mg/dL — ABNORMAL HIGH (ref 0.61–1.24)
GFR, Estimated: 55 mL/min — ABNORMAL LOW (ref >60)
Glucose, Bld: 90 mg/dL (ref 70–99)
Phosphorus: 3.9 mg/dL (ref 2.5–4.6)
Potassium: 5.1 mmol/L (ref 3.5–5.1)
Sodium: 140 mmol/L (ref 135–145)

## 2023-10-19 LAB — MAGNESIUM: Magnesium: 1.7 mg/dL (ref 1.7–2.4)

## 2023-10-19 MED ORDER — LOPERAMIDE HCL 2 MG PO CAPS
2.0000 mg | ORAL_CAPSULE | Freq: Three times a day (TID) | ORAL | 0 refills | Status: AC | PRN
  Filled 2023-10-19: qty 30, 10d supply, fill #0

## 2023-10-19 MED ORDER — LOPERAMIDE HCL 2 MG PO CAPS
4.0000 mg | ORAL_CAPSULE | Freq: Once | ORAL | Status: AC
  Administered 2023-10-19: 4 mg via ORAL
  Filled 2023-10-19: qty 2

## 2023-10-19 MED ORDER — LOPERAMIDE HCL 2 MG PO CAPS: 2.0000 mg | ORAL_CAPSULE | Freq: Three times a day (TID) | ORAL | Status: DC | PRN

## 2023-10-19 NOTE — Progress Notes (Signed)
   10/19/23 1218  TOC Brief Assessment  Insurance and Status Reviewed  Patient has primary care physician Yes  Home environment has been reviewed home w/ daughter  Prior level of function: independent  Prior/Current Home Services No current home services  Social Drivers of Health Review SDOH reviewed no interventions necessary  Readmission risk has been reviewed Yes  Transition of care needs no transition of care needs at this time    Pt stable for transition home today, no HH or DME needs noted.

## 2023-10-19 NOTE — Discharge Instructions (Signed)
 Potassium Potassium affects how well your heart beats. Too much potassium in your blood can cause an irregular heartbeat or even a heart attack. You may need to limit foods that are high in potassium, such as: Liquid milk and soy milk. Salt substitutes that contain potassium. Fruits like: Bananas. Apricots. Melon. Prunes and raisins. Kiwi. Nectarines and oranges. Vegetables, such as: Potatoes, sweet potatoes, and yams. Tomatoes. Leafy greens. Beets. Avocado. Pumpkin and winter squash. Beans, like lima beans. Nuts.

## 2023-10-19 NOTE — Discharge Summary (Signed)
 Physician Discharge Summary   Patient: Dale Sanford MRN: [id number] DOB: 09-Mar-1951  Admit date:     10/16/2023  Discharge date: 10/19/23  Discharge Physician: Nydia Distance, MD    PCP: Merilee, LITTIE.[name], MD (Inactive)   Recommendations at discharge:   Entresto , spironolactone  held due to AKI and critical hyperkalemia Outpatient follow-up with this cardiology scheduled on 11/06/2023 with Dr. Shlomo Obtain BMP in 1 week  Discharge Diagnoses:    Acute kidney injury superimposed on chronic kidney disease (HCC)   Hyperkalemia   Metabolic acidosis, increased anion gap   Leukocytosis   Lactic acidosis   Hypotension   Heart failure with mildly reduced ejection fraction [facility])   Hypomagnesemia    Hospital Course:  Patient is a 73 y.o. male with hypertension, nonischemic cardiomyopathy, HFrEF (echo EF 45-50% in 2024) , aortic valve insufficiency s/p AVR, and GERD presented with dizziness and weakness. He experienced dizziness and weakness after getting off work and attempting to eat, describing a sensation of 'real dizzy' prior to falling to the floor. This episode occurred yesterday, and he had not eaten all day prior to the event. The patient noted that it was hot at the time.  He is on GDMT for sCHF, including aspirin , BB, Entresto  and spironolactone , with no recent changes. He  experienced a 'runny' stool a day before admisison but not diarrhea. Occasional stomach pains are reported, but no blood in stools. He has not been on antibiotics recently. He reports frequent urination, getting up three to four times a night to use the bathroom, which is not a new symptom. No pain with urination is noted. No recent cough, fever, or smoking history. He does not consume alcohol . His two brothers smoke, but he does not.   In ED BP soft 91/52, HR 137. Cr 3.31, WBC 22. K >7.5  Renal consultted  Patient had been given 3 L of lactated Ringer 's, insulin , dextrose , amp of sodium bicarb, calcium   gluconate, albuterol  nebs, Lokelma ,  Lasix  80 mg IV, Rocephin , and metronidazole  IV.    Assessment and Plan:  Acute kidney injury superimposed on chronic kidney disease stage IIIb Hyperkalemia, AGMA  - In ED, K > 7.5, sodium bicarb 14, BUN 74, creatinine 3.31, and anion gap of 17.  EKG noted signs of T wave peaking.   -  Creatinine was 1.38 on 09/21/2022.   - Nephrology was consulted, treated with IV fluids, insulin , dextrose , sodium bicarb, Lokelma , 3 L of IV fluids, and Lasix  80 mg IV.   - Improving, entresto , aldactone  stopped  -Potassium 5.1 at discharge    Leukocytosis, Lactic acidosis Acute.  WBC elevated at 22 with initial lactic acid of 2.6.  Patient had reported having a mild cough.  CT scan of the chest abdomen pelvis did not reveal any acute abnormality, early changes of interstitial lung disease.   - leukocytosis resolved, possible stress demargination, PCT < 0.1 - flu, RSV, covid negative   - IV antibiotics dc'ed      Transient hypotension, near syncope  - BP now stable, heart rate controlled,  - Cardiology was consulted, recommended to continue to hold Entresto , Aldactone   - BP now stable, heart rate controlled, resume Toprol -XL 200 mg daily at discharge.    Heart failure with mildly reduced ejection fraction. Patient appears to be euvolemic on physical exam.  Last echocardiogram noted EF to be 45 to 50% with indeterminate diastolic parameters. - received lasix  40mg  IV x 1  - Currently stable, seen by cardiology, Dr. Jeffrie,  continue to hold Entresto , Aldactone  due to AKI and critical hyperkalemia.  Recommended to resume Toprol -XL 200 mg daily   Hypomagnesemia Acute.  - received Mag on 7/29, magnesium  1.7 at discharge   Diarrhea - C. difficile negative, placed on Imodium        PT evaluation completed, no PT follow-up     Pain control - [place]  Controlled Substance Reporting System database was reviewed. and patient was instructed, not to drive, operate  heavy machinery, perform activities at heights, swimming or participation in water activities or provide baby-sitting services while on Pain, Sleep and Anxiety Medications; until their outpatient Physician has advised to do so again. Also recommended to not to take more than prescribed Pain, Sleep and Anxiety Medications.  Consultants: Nephrology, cardiology Procedures performed: None Disposition: Home Diet recommendation:  Discharge Diet Orders (From admission, onward)     Start     Ordered   10/19/23 0000  Diet - low sodium heart healthy        10/19/23 1137            DISCHARGE MEDICATION: Allergies as of 10/19/2023       Reactions   Nyquil Multi-symptom [[id number]-doxylamine-dm-apap] Itching, Rash        Medication List     STOP taking these medications    Entresto  97-103 MG Generic drug: sacubitril -valsartan    spironolactone  25 MG tablet Commonly known as: ALDACTONE        TAKE these medications    aspirin  EC 81 MG tablet Take 81 mg by mouth daily.   loperamide  2 MG capsule Commonly known as: IMODIUM  Take 1 capsule (2 mg total) by mouth every 8 (eight) hours as needed for diarrhea or loose stools.   metoprolol  200 MG 24 hr tablet Commonly known as: TOPROL -XL TAKE 1 TABLET BY MOUTH EVERY DAY        Follow-up Information     Shlomo Wilbert SAUNDERS, MD Follow up on 11/06/2023.   Specialty: Cardiology Why: at 10:00 AM, for hospital follow-up Contact information: [address] [place] [address] [phone]         Merilee, L.[name], MD. Schedule an appointment as soon as possible for a visit in 1 week(s).   Specialty: Family Medicine Why: for hospital follow-up, obtain labs BMET Contact information: [address] [place] [address] [phone]                Discharge Exam: S: Feeling better, no acute complaints, no nausea vomiting, abdominal pain.  Had diarrhea yesterday.  No fevers.  BP (!) 145/53 (BP  Location: Right Arm)   Pulse 80   Temp 98.1 F (36.7 C) (Oral)   Resp 18   SpO2 95%   Physical Exam General: Alert and oriented x 3, NAD Cardiovascular: S1 S2 clear, RRR.  Respiratory: CTAB, no wheezing Gastrointestinal: Soft, nontender, nondistended, NBS Ext: no pedal edema bilaterally Neuro: no new deficits Psych: Normal affect    Condition at discharge: fair  The results of significant diagnostics from this hospitalization (including imaging, microbiology, ancillary and laboratory) are listed below for reference.   Imaging Studies: CT CHEST ABDOMEN PELVIS WO CONTRAST Result Date: 10/16/2023 CLINICAL DATA:  Clemens today. EXAM: CT CHEST, ABDOMEN AND PELVIS WITHOUT CONTRAST TECHNIQUE: Multidetector CT imaging of the chest, abdomen and pelvis was performed following the standard protocol without IV contrast. RADIATION DOSE REDUCTION: This exam was performed according to the departmental dose-optimization program which includes automated exposure control, adjustment of the mA and/or kV  according to patient size and/or use of iterative reconstruction technique. COMPARISON:  Remote CT scan from 2017 FINDINGS: CT CHEST FINDINGS Cardiovascular: The heart is normal in size. No pericardial effusion. The aorta is normal in caliber. No dissection. 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 Mediastinum/Nodes: No mediastinal or hilar mass or lymphadenopathy. The esophagus is unremarkable. Lungs/Pleura: No infiltrates, edema or effusions. No pulmonary contusion or pneumothorax. Find largely nodular peripheral interstitial process likely early interstitial lung disease. No bronchiectasis. The central tracheobronchial tree is unremarkable. No worrisome pulmonary nodules or pulmonary lesions. Musculoskeletal: No acute bony findings. No sternal, rib or thoracic vertebral body fractures. CT ABDOMEN PELVIS FINDINGS Hepatobiliary: Stable left hepatic lobe cyst. No worrisome hepatic lesions are identified  without contrast. No intrahepatic biliary dilatation. The gallbladder is contracted. Gallstones suspected. No common bile duct dilatation. Pancreas: Unremarkable. No pancreatic ductal dilatation or surrounding inflammatory changes. Spleen: Normal in size without focal abnormality. Adrenals/Urinary Tract: The adrenal glands are normal. There are 2 exophytic lesions associated with the left kidney. Upper pole midpole junction lesion laterally measures 13 mm and measures 32 Hounsfield units. The interpolar lesion posteromedially measures 10 mm and measures 30 Hounsfield units. Possible complex/hemorrhagic or proteinaceous cysts but could not exclude small solid lesions. Recommend follow-up MRI abdomen without and with contrast for further evaluation. No renal calculi or hydronephrosis. The bladder is grossly normal. Stomach/Bowel: Stomach, duodenum, small and colon are grossly normal. No inflammatory process, mass lesion or obstructive findings. Vascular/Lymphatic: Moderate atherosclerotic calcifications involving the aorta and iliac arteries but no aneurysm. No abdominal or pelvic lymphadenopathy. No mesenteric or retroperitoneal. Reproductive: Enlarged prostate gland impressing on the base of the bladder. Other: No pelvic mass or adenopathy. No free pelvic fluid collections. No inguinal mass or adenopathy. Small periumbilical abdominal wall hernia containing fat. Small left inguinal hernia containing fat. Musculoskeletal: No acute bony findings. There is a stable remote compression fracture of L2. Right total hip arthroplasty noted. No complicating features. The pubic symphysis and SI joints are intact. No pelvic fractures. IMPRESSION: 1. No acute findings involving the chest, abdomen or pelvis. 2. 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 3. 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 4. Two exophytic lesions associated with the left kidney. Possible complex/hemorrhagic or proteinaceous cysts  but could not exclude small solid lesions. Recommend follow-up MRI abdomen without and with contrast for further evaluation (non urgent). 5. Enlarged prostate gland impressing on the base of the bladder. Aortic Atherosclerosis (ICD10-I70.0). Electronically Signed   By: MYRTIS Stammer M.D.   On: 10/16/2023 22:42   CT Head Wo Contrast Result Date: 10/16/2023 CLINICAL DATA:  Head trauma, minor (Age >= 65y); Neck trauma (Age >= 65y) EXAM: CT HEAD WITHOUT CONTRAST CT CERVICAL SPINE WITHOUT CONTRAST TECHNIQUE: Multidetector CT imaging of the head and cervical spine was performed following the standard protocol without intravenous contrast. Multiplanar CT image reconstructions of the cervical spine were also generated. RADIATION DOSE REDUCTION: This exam was performed according to the departmental dose-optimization program which includes automated exposure control, adjustment of the mA and/or kV according to patient size and/or use of iterative reconstruction technique. COMPARISON:  MRI head 11/19/1999 FINDINGS: CT HEAD FINDINGS Brain: No evidence of acute infarction, hemorrhage, hydrocephalus, extra-axial collection or mass lesion/mass effect. 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 Patchy white matter hypodensities, compatible with chronic microvascular ischemic disease. Vascular: No hyperdense vessel or unexpected calcification. Skull: No acute fracture. Sinuses/Orbits: No acute finding. CT CERVICAL SPINE FINDINGS Alignment: Normal. Skull  base and vertebrae: No acute fracture. No primary bone lesion or focal pathologic process. Soft tissues and spinal canal: No prevertebral fluid or swelling. No visible canal hematoma. Disc levels: Moderate multilevel bony degenerative change. Facet and uncovertebral hypertrophy contributes to varying degrees of neural foraminal stenosis. Upper chest: Lung apices are clear. IMPRESSION: 1. No evidence of acute intracranial abnormality. 2. No  evidence of acute fracture or traumatic malalignment in the cervical spine. 3. Remote infarcts and chronic microvascular ischemic disease. Electronically Signed   By: Gilmore GORMAN Molt M.D.   On: 10/16/2023 21:00   CT Cervical Spine Wo Contrast Result Date: 10/16/2023 CLINICAL DATA:  Head trauma, minor (Age >= 65y); Neck trauma (Age >= 65y) EXAM: CT HEAD WITHOUT CONTRAST CT CERVICAL SPINE WITHOUT CONTRAST TECHNIQUE: Multidetector CT imaging of the head and cervical spine was performed following the standard protocol without intravenous contrast. Multiplanar CT image reconstructions of the cervical spine were also generated. RADIATION DOSE REDUCTION: This exam was performed according to the departmental dose-optimization program which includes automated exposure control, adjustment of the mA and/or kV according to patient size and/or use of iterative reconstruction technique. COMPARISON:  MRI head 11/19/1999 FINDINGS: CT HEAD FINDINGS Brain: No evidence of acute infarction, hemorrhage, hydrocephalus, extra-axial collection or mass lesion/mass effect. 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 Patchy white matter hypodensities, compatible with chronic microvascular ischemic disease. Vascular: No hyperdense vessel or unexpected calcification. Skull: No acute fracture. Sinuses/Orbits: No acute finding. CT CERVICAL SPINE FINDINGS Alignment: Normal. Skull base and vertebrae: No acute fracture. No primary bone lesion or focal pathologic process. Soft tissues and spinal canal: No prevertebral fluid or swelling. No visible canal hematoma. Disc levels: Moderate multilevel bony degenerative change. Facet and uncovertebral hypertrophy contributes to varying degrees of neural foraminal stenosis. Upper chest: Lung apices are clear. IMPRESSION: 1. No evidence of acute intracranial abnormality. 2. No evidence of acute fracture or traumatic malalignment in the cervical spine. 3. Remote  infarcts and chronic microvascular ischemic disease. Electronically Signed   By: Gilmore GORMAN Molt M.D.   On: 10/16/2023 21:00    Microbiology: Results for orders placed or performed during the hospital encounter of 10/16/23  Blood culture (routine x 2)     Status: None (Preliminary result)   Collection Time: 10/16/23  9:35 PM   Specimen: BLOOD RIGHT ARM  Result Value Ref Range Status   Specimen Description   Final    BLOOD RIGHT ARM Performed at [facility], [address] [place], [place] [address]    Special Requests   Final    BOTTLES DRAWN AEROBIC AND ANAEROBIC Blood Culture adequate volume Performed at [facility], [address] [place], [place] [address]    Culture   Final    NO GROWTH 1 DAY Performed at [facility], [address] [place], [place] [address]    Report Status PENDING  Incomplete  Blood culture (routine x 2)     Status: None (Preliminary result)   Collection Time: 10/16/23 11:16 PM   Specimen: BLOOD RIGHT ARM  Result Value Ref Range Status   Specimen Description   Final    BLOOD RIGHT ARM Performed at [facility], [address] [place], [place] [address]    Special Requests   Final    BOTTLES DRAWN AEROBIC AND ANAEROBIC Blood Culture adequate volume Performed at [facility], [address], [place], [place] [address]    Culture   Final  NO GROWTH 1 DAY Performed at [facility], [address] [place], [place] [address]    Report Status PENDING  Incomplete  Resp panel by RT-PCR (RSV, Flu A&B, Covid) Anterior Nasal Swab     Status: None   Collection Time: 10/17/23  1:40 PM   Specimen: Anterior Nasal Swab  Result Value Ref Range Status   SARS Coronavirus 2 by RT PCR NEGATIVE NEGATIVE Final   Influenza A by PCR NEGATIVE NEGATIVE Final   Influenza B by PCR NEGATIVE NEGATIVE Final    Comment: (NOTE) The Xpert Xpress SARS-CoV-2/FLU/RSV plus assay is  intended as an aid in the diagnosis of influenza from Nasopharyngeal swab specimens and should not be used as a sole basis for treatment. Nasal washings and aspirates are unacceptable for Xpert Xpress SARS-CoV-2/FLU/RSV testing.  Fact Sheet for Patients: BloggerCourse.com  Fact Sheet for Healthcare Providers: SeriousBroker.it  This test is not yet approved or cleared by the [place]  FDA and has been authorized for detection and/or diagnosis of SARS-CoV-2 by FDA under an Emergency Use Authorization (EUA). This EUA will remain in effect (meaning this test can be used) for the duration of the COVID-19 declaration under Section 564(b)(1) of the Act, 21 U.S.C. section 360bbb-3(b)(1), unless the authorization is terminated or revoked.     Resp Syncytial Virus by PCR NEGATIVE NEGATIVE Final    Comment: (NOTE) Fact Sheet for Patients: BloggerCourse.com  Fact Sheet for Healthcare Providers: SeriousBroker.it  This test is not yet approved or cleared by the [place]  FDA and has been authorized for detection and/or diagnosis of SARS-CoV-2 by FDA under an Emergency Use Authorization (EUA). This EUA will remain in effect (meaning this test can be used) for the duration of the COVID-19 declaration under Section 564(b)(1) of the Act, 21 U.S.C. section 360bbb-3(b)(1), unless the authorization is terminated or revoked.  Performed at [facility], [address] [place], [place] [address]   C Difficile Quick Screen w PCR reflex     Status: None   Collection Time: 10/18/23  5:58 PM   Specimen: STOOL  Result Value Ref Range Status   C Diff antigen NEGATIVE NEGATIVE Final   C Diff toxin NEGATIVE NEGATIVE Final   C Diff interpretation No C. difficile detected.  Final    Comment: Performed at [facility], [address], [place], [place] [address]    Labs: CBC: Recent  Labs  Lab 10/16/23 2002 10/18/23 0537 10/19/23 0533  WBC 22.0* 10.2 9.0  NEUTROABS 19.1*  --   --   HGB 13.4 11.0* 11.1*  HCT 39.1 31.9* 32.2*  MCV 86.9 86.4 86.1  PLT 355 216 215   Basic Metabolic Panel: Recent Labs  Lab 10/16/23 2002 10/16/23 2208 10/17/23 0153 10/17/23 1044 10/18/23 0537 10/19/23 0533  NA 135 134* 137 138 137 140  K >7.5* >7.5* 5.5* 6.0* 5.6* 5.1  CL 105 105 104 108 112* 110  CO2 14* 14* 16* 18* 20* 22  GLUCOSE 134* 120* 172* 107* 90 90  BUN 74* 79* 74* 64* 42* 33*  CREATININE 3.31* 3.21* 2.72* 2.11* 1.36* 1.35*  CALCIUM  9.8 9.3 8.9 8.6* 8.5* 8.4*  MG 1.8  --   --  1.4*  --  1.7  PHOS  --   --   --  4.2  --  3.9   Liver Function Tests: Recent Labs  Lab 10/16/23 2002 10/17/23 1044 10/19/23 0533  AST 19  --   --   ALT  22  --   --   ALKPHOS 68  --   --   BILITOT 0.4  --   --   PROT 7.8  --   --   ALBUMIN 4.6 3.2* 3.1*   CBG: Recent Labs  Lab 10/16/23 2324  GLUCAP 259*    Discharge time spent: greater than 30 minutes.  Signed: Nydia Distance, MD [facility] 10/19/2023

## 2023-10-20 LAB — LAB REPORT - SCANNED: EGFR: 55

## 2023-10-22 LAB — CULTURE, BLOOD (ROUTINE X 2)
Culture: NO GROWTH
Culture: NO GROWTH
Special Requests: ADEQUATE
Special Requests: ADEQUATE

## 2023-11-05 NOTE — Progress Notes (Unsigned)
 Date:  11/06/2023   ID:  Dale Sanford Search, DOB [birth year], MRN [id number]   PCP:  Merilee Sanford.[name], MD (Inactive)  Cardiologist:  [name], MD  Electrophysiologist:  None   Chief Complaint:  CHF, HTN, AVR, DCM  History of Present Illness:    Dale Sanford is a 73 y.o. 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  2D echocardiogram 12/01/2015 showed mildly reduced LV systolic function with EF 40 to 45% with diffuse hypokinesis and grade 2 diastolic dysfunction.  He had a stable aortic valve bioprosthesis at that time and aortic root was mildly dilated at 39 mm.  Nuclear stress test for preoperative cardiac clearance showed an EF of 44% with no ischemia.   Echo 05/01/2018 showed that LVEF decreased from 40-45% to 20-25% with moderate MR and moderate aortic regurgitation.  His Losartan  was stopped and started Entresto  and spironolactone  and underwent right and LHC which showed minimal luminal irregularities with no evidence of obstructive coronary artery disease. There was moderately to severely reduced LV systolic function with an EF of 30 to 35% with global hypokinesis.  Right heart catheterization showed mildly elevated filling pressures, mild pulmonary hypertension and mildly reduced cardiac output.  RA pressure: 7 mmHg, RV pressure 39 over 3 mmHg, pulmonary capillary wedge pressure: 15 mmHg, PA pressure: 39/11 with a mean of 24 mmHg.  Cardiac output was 3.45 with an index of 2.16. He was also ordered to get nuclear imaging to assess for TTR amyloid but study on 05/31/18 was equivocal. He underwent cardiac MRI which which showed EF 40% with no evidence of cardiac amyloid and no late Gad enhancement to suggest infarct or infiltrative process.  Aortic root was dilated at 40mm.    He is here today for followup.  He is doing well.  He was recently in the hospital for presyncope,  hyperkalemia and AKI and his Entresto  and Spiro were stopped.  K+ was >7 at the time and SCr was 3.31.  He occasionally has some mild DOE.  He denies any chest pain or pressure,  PND, orthopnea, LE edema, palpitations or syncope.   Prior CV studies:   The following studies were reviewed today:  2D echo 09/2019 IMPRESSIONS    1. Difficult acoustic windows limit study . There is mild inferior,  inferoseptal hypokinesis. . Left ventricular ejection fraction, by  estimation, is 45 to 50%. The left ventricle has mildly decreased  function. Left ventricular diastolic parameters are  consistent with Grade I diastolic dysfunction (impaired relaxation).   2. Right ventricular systolic function is normal. The right ventricular  size is normal. There is normal pulmonary artery systolic pressure.   3. Left atrial size was mildly dilated.   4. The mitral valve is normal in structure. Mild mitral valve  regurgitation.   5. A native pulmonic valve is present in aortic position. Opens well. .  The aortic valve has been repaired/replaced. Aortic valve regurgitation is  mild.   6. Pulmonic valve allograft present Opens well. Normal gradients.   7. S/p Ross procedure with ascending aorta replacement . Aortic  root/ascending aorta has been repaired/replaced. There is mild dilatation  of the ascending aorta measuring 40 mm.   8. The inferior vena cava is normal in size with greater than 50%  respiratory variability, suggesting right atrial pressure of 3 mmHg.   Cardiac MRI 09/2018 FINDINGS: 1.  Normal left ventricular size. Mildly reduced systolic function, global hypokinesis. Normal wall thickness.   There is no late gadolinium enhancement in the left ventricular myocardium.   Normal T1 myocardial nulling kinetics, suggests against a diagnosis of cardiac amyloidosis.   2. Normal right ventricular size, thickness. Mildly reduced systolic function. There are no regional wall motion abnormalities.    3.  Moderate left atrial enlargement.  Mid right atrial enlargement.   4. Sinuses 36 x 37 x 37 mm in short axis of the aortic valve. In the sagittal oblique candy cane aorta view, aortic root measure approximately 40 mm in a transverse, non-double oblique measurement. Mid ascending aorta measures 30 mm in transverse measurement. Arch 26 mm, proximal descending aorta 24 mm, mid descending 23 mm.   5. Aortic and mitral regurgitation. Aortic regurgitation appears moderate. Mitral valve regurgitation is not well assessed.   6.  Normal pericardium.  No pericardial effusion.   Extracardiac findings:   Probable hepatic cyst 26 mm. Compared to CT Abdomen pelvis report 04/28/2015, this is likely a stable finding.   IMPRESSION: 1. Normal T1 myocardial nulling kinetics, suggests against a diagnosis of cardiac amyloidosis.   2. There is no late gadolinium enhancement in the left ventricular myocardium. No evidence of infarction, infiltrative process, or cardiomyopathic process.   3. Mildly dilated aortic root. Measured in a transverse plane, the aortic root measures 40 mm. This measurement correlates with echocardiographic measurements.  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  Cardiac MRI with EF 40% 09/2018.   Dilated aortic root (HCC) 09/13/2017   40mm by cardiac MRI 09/2018   GERD (gastroesophageal reflux disease)    Hypertension    Poor dental hygiene    Past Surgical History:  Procedure Laterality Date   DESCENDING AORTIC resesection, replacement  12/1999   Hemashield Graft   ENDOVASCULAR REPAIR OF POPLITEAL ARTERY ANEURYSM  11/1999    infectious   RIGHT/LEFT HEART CATH AND CORONARY ANGIOGRAPHY N/A 05/11/2018   Procedure: RIGHT/LEFT HEART CATH AND CORONARY ANGIOGRAPHY;  Surgeon: Darron Deatrice LABOR, MD;  Location: [facility];  Service: Cardiovascular;  Laterality: N/A;   TOTAL HIP ARTHROPLASTY Right 02/18/2016   Procedure: RIGHT TOTAL HIP ARTHROPLASTY ANTERIOR APPROACH;  Surgeon: Redell Shoals, MD;  Location: [facility];  Service: Orthopedics;  Laterality: Right;     Current Meds  Medication Sig   aspirin  EC 81 MG tablet Take 81 mg by mouth daily.   loperamide  (IMODIUM ) 2 MG capsule Take 1 capsule (2 mg total) by mouth every 8 (eight) hours as needed for diarrhea or loose stools.   metoprolol  (TOPROL -XL) 200 MG 24 hr tablet TAKE 1 TABLET BY MOUTH EVERY DAY     Allergies:   Nyquil multi-symptom [pseudoeph-doxylamine-dm-apap]   Social History   Tobacco Use   Smoking status: Never   Smokeless tobacco: Never  Vaping Use   Vaping status: Never Used  Substance Use Topics   Alcohol  use: No    Comment: quit in 2008   Drug use: No     Family Hx: The patient's family history includes CAD in his father; Cancer in his brother; Cirrhosis  in his brother.  ROS:   Please see the history of present illness.     All other systems reviewed and are negative.   Labs/Other Tests and Data Reviewed:    Recent Labs: 10/16/2023: ALT 22 10/19/2023: BUN 33; Creatinine, Ser 1.35; Hemoglobin 11.1; Magnesium  1.7; Platelets 215; Potassium 5.1; Sodium 140   Recent Lipid Panel Lab Results  Component Value Date/Time   CHOL 126 04/20/2020 09:12 AM   TRIG 66 04/20/2020 09:12 AM   HDL 38 (L) 04/20/2020 09:12 AM   CHOLHDL 3.3 04/20/2020 09:12 AM   LDLCALC 74 04/20/2020 09:12 AM    Wt Readings from Last 3 Encounters:  11/06/23 150 lb (68 kg)  09/13/22 145 lb (65.8 kg)  08/30/22 146 lb 9.6 oz (66.5 kg)     Objective:    Vital Signs:  BP (!) 158/52   Pulse 84   Ht 5' 2 (1.575 m)   Wt 150 lb (68 kg)   SpO2 99%   BMI  27.44 kg/m    GEN: Well nourished, well developed in no acute distress HEENT: Normal NECK: No JVD; No carotid bruits LYMPHATICS: No lymphadenopathy CARDIAC:RRR, no murmurs, rubs, gallops RESPIRATORY:  Clear to auscultation without rales, wheezing or rhonchi  ABDOMEN: Soft, non-tender, non-distended MUSCULOSKELETAL:  No edema; No deformity  SKIN: Warm and dry NEUROLOGIC:  Alert and oriented x 3 PSYCHIATRIC:  Normal affect  ASSESSMENT & PLAN:    1.  Nonischemic DCM  - cardiac cath 04/2018 showed normal coornary arteries.  - Cardiac MRI with no evidence of infiltrative disease or infarct.  EF was 40%.   - 2D echo 09/2019 with EF 45-50% - 2D echo 10/2022 with EF 45-50% - he appears euvolemic on exam today - continue Toprol  XL 200mg  daily with PRN refills - no ARB/ARNI/MRA due to CKD and hyperkalemia on it prior  2.  AV disease  -s/p endocarditis complicated by AI s/p Ross procedure with  AVR autograft with Pulmonic tissue valve 2001.   -2D echo 09/2019 showed stable PV in the aortic position (mean gradient 73mmHg)with mild AR -2D echo 10/2022 with moderate AR (PHT ), mean AVG , Vmax 1.89m/s and mean PVG .  -repeat echo for AR  3.  Descending aortic aneurysm  -s/p aortic resection with replacement with Hemashield graft in 2001.   -Cardiac MRI 09/2018 showed 40mm aortic root dilatation.   -2D echo 09/2019 with stable 40mm aortic root -2D echo 10/2022 with 39mm Asc Ao and 36mm AoR -repeat 2D echo  4.  Hypertension  -BP controlled on exam today -continue Toprol  XL 200mg  daily with PRN refills  5.  Combined systolic/diastolic CHF  -he appears euvolemic on exam today -Continue Toprol  XL 200mg  daily with PRN refills -Cannot use spiro due to hx of hyperkalemia on it before. -no ACE/ARB/ARNI/MRA due to CKD/hyperkalemia   Medication Adjustments/Labs and Tests Ordered: Current medicines are reviewed at length with the patient today.  Concerns regarding medicines are outlined  above.  Tests Ordered: No orders of the defined types were placed in this encounter.  Medication Changes: No orders of the defined types were placed in this encounter.   Disposition:  Follow up 1 year  Signed, [name], MD  11/06/2023 9:22 AM    [facility]

## 2023-11-06 ENCOUNTER — Ambulatory Visit: Attending: Cardiology | Admitting: Cardiology

## 2023-11-06 ENCOUNTER — Encounter: Payer: Self-pay | Admitting: Cardiology

## 2023-11-06 VITALS — BP 158/52 | HR 84 | Ht 62.0 in | Wt 150.0 lb

## 2023-11-06 DIAGNOSIS — I359 Nonrheumatic aortic valve disorder, unspecified: Secondary | ICD-10-CM | POA: Diagnosis not present

## 2023-11-06 DIAGNOSIS — I420 Dilated cardiomyopathy: Secondary | ICD-10-CM | POA: Diagnosis not present

## 2023-11-06 DIAGNOSIS — I719 Aortic aneurysm of unspecified site, without rupture: Secondary | ICD-10-CM | POA: Diagnosis not present

## 2023-11-06 DIAGNOSIS — I10 Essential (primary) hypertension: Secondary | ICD-10-CM

## 2023-11-06 DIAGNOSIS — I5042 Chronic combined systolic (congestive) and diastolic (congestive) heart failure: Secondary | ICD-10-CM

## 2023-11-06 NOTE — Patient Instructions (Signed)
 Medication Instructions:  Your physician recommends that you continue on your current medications as directed. Please refer to the Current Medication list given to you today.  *If you need a refill on your cardiac medications before your next appointment, please call your pharmacy*  Lab Work: None.  If you have labs (blood work) drawn today and your tests are completely normal, you will receive your results only by: MyChart Message (if you have MyChart) OR A paper copy in the mail If you have any lab test that is abnormal or we need to change your treatment, we will call you to review the results.  Testing/Procedures: Your physician has requested that you have an echocardiogram. Echocardiography is a painless test that uses sound waves to create images of your heart. It provides your doctor with information about the size and shape of your heart and how well your heart's chambers and valves are working. This procedure takes approximately one hour. There are no restrictions for this procedure. Please do NOT wear cologne, perfume, aftershave, or lotions (deodorant is allowed). Please arrive 15 minutes prior to your appointment time.  Please note: We ask at that you not bring children with you during ultrasound (echo/ vascular) testing. Due to room size and safety concerns, children are not allowed in the ultrasound rooms during exams. Our front office staff cannot provide observation of children in our lobby area while testing is being conducted. An adult accompanying a patient to their appointment will only be allowed in the ultrasound room at the discretion of the ultrasound technician under special circumstances. We apologize for any inconvenience.   Follow-Up: At [facility], you and your health needs are our priority.  As part of our continuing mission to provide you with exceptional heart care, our providers are all part of one team.  This team includes your primary Cardiologist  (physician) and Advanced Practice Providers or APPs (Physician Assistants and Nurse Practitioners) who all work together to provide you with the care you need, when you need it.  Your next appointment:   1 year(s)  Provider:   [name], MD    We recommend signing up for the patient portal called MyChart.  Sign up information is provided on this After Visit Summary.  MyChart is used to connect with patients for Virtual Visits (Telemedicine).  Patients are able to view lab/test results, encounter notes, upcoming appointments, etc.  Non-urgent messages can be sent to your provider as well.   To learn more about what you can do with MyChart, go to [email or web address].

## 2023-11-06 NOTE — Addendum Note (Signed)
 Addended by: JANIT GENI CROME on: 11/06/2023 09:32 AM   Modules accepted: Orders

## 2023-12-14 ENCOUNTER — Ambulatory Visit (HOSPITAL_COMMUNITY)
Admission: RE | Admit: 2023-12-14 | Discharge: 2023-12-14 | Disposition: A | Discharge status: Home / Self Care | Source: Ambulatory Visit | Attending: Cardiology | Admitting: Cardiology

## 2023-12-14 DIAGNOSIS — I359 Nonrheumatic aortic valve disorder, unspecified: Secondary | ICD-10-CM | POA: Diagnosis present

## 2023-12-14 DIAGNOSIS — I351 Nonrheumatic aortic (valve) insufficiency: Secondary | ICD-10-CM

## 2023-12-14 LAB — ECHOCARDIOGRAM COMPLETE
AR max vel: 2.55 cm2
AV Area VTI: 2.68 cm2
AV Area mean vel: 2.4 cm2
AV Mean grad: 4 mmHg
AV Peak grad: 7.7 mmHg
Ao pk vel: 1.39 m/s
Area-P 1/2: 4.83 cm2
MV M vel: 4.07 m/s
MV Peak grad: 66.3 mmHg
P 1/2 time: 251 ms
Radius: 0.7 cm
S' Lateral: 3.8 cm

## 2023-12-15 ENCOUNTER — Ambulatory Visit: Payer: Self-pay | Admitting: Cardiology

## 2023-12-15 NOTE — Telephone Encounter (Signed)
-----   Message from [name] sent at 12/15/2023  1:04 PM EDT ----- Echo showed moderately reduced LVF EF 35-40% with mild LV enlargement, mildly thickened heart muscle, mild to moderately leaky MV.  His prior ROss procedure with AVR now has severe leakiness of the  AVR.  Compared to prior his LVF has declined so may be due to worsening AR. He needs to come in to see extender to get set up for a TEE to assess the leaky AVR ----- Message ----- From: Interface, Three One Seven Sent: 12/14/2023   4:52 PM EDT To: [name], MD

## 2023-12-15 NOTE — Telephone Encounter (Signed)
 Call to patient to discuss that Echo showed moderately reduced LVF EF 35-40% with mild LV enlargement, mildly thickened heart muscle, mild to moderately leaky MV.  Patient verbalizes understanding prior ROss procedure with AVR now has severe leakiness of the  AVR.  Compared to prior his LVF has declined so may be due to worsening AR. Appt made with T. Lucien PA-C Thursday 12/21/23.

## 2023-12-20 NOTE — H&P (View-Only) (Signed)
 Cardiology Office Note   Date:  12/21/2023  ID:  Dale Sanford, DOB 1950-08-04, MRN [id number] PCP: Merilee LITTIE.[name], MD (Inactive)  [facility] Providers Cardiologist:  [name], MD   History of Present Illness Dale Sanford is a 73 y.o. 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  History includes 2D echocardiogram 12/01/2015 showing mildly reduced LVEF of 40 to 45% with diffuse hypokinesis and grade 2 diastolic dysfunction.  Had stable aortic valve bioprosthesis at that time and aortic root was mildly dilated at 39 mm.  Nuclear stress test for preoperative cardiac clearance that showed EF of 44% with no ischemia.  Echocardiogram 05/01/2018 showed that LVEF decreased from 48-45 to 20-25 with moderate MR and moderate aortic regurgitation.  Losartan  was stopped and started on Entresto  with spironolactone  and underwent right and left LHC which showed normal luminal irregularities with no evidence of obstructive coronary artery disease.  There was a moderately to severely reduced LVEF of 30 to 35% with global hypokinesis.  Right heart catheterization showed mildly elevated filling pressures, mild pulmonary hypertension and mildly reduced cardiac output.  RA pressure 7 mmHg, RV pressure 39 over 3 mmHg, pulmonary wedge pressure 15 mm/h, PA pressure 39/11 with a mean of 24 mmHg.  Cardiac output was 3.45 with index of 2.16.  He was also ordered to get a nuclear imaging to assess for TTR amyloid but study on 05/31/2018 was equivocal.  Underwent cardiac MRI which showed EF 40% with no evidence of cardiac amyloid and no late gadolinium enhancement to suggest infarct or infiltrative process.  Aortic root was dilated at 40 mm.  Was seen 11/06/2023 and was doing well at that time.  Recently in the hospital for presyncope,  hyperkalemia and AKI.  Entresto  and spironolactone  were stopped.  Potassium was greater than 7 at that time and serum creatinine was 3.31.  Occasionally had some mild DOE.  Denied chest pain or pressure, PND, orthopnea, lower extremity edema, palpitations, or syncope.  Today, he presents with aortic valve replacement who presents for evaluation of severe aortic valve leakage.  He underwent aortic valve replacement in 2001. A recent surface echocardiogram revealed leakage of the aortic valve. 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 He denies dizziness or lightheadedness.  He monitors his blood pressure at home and has a history of elevated blood pressure. A nurse visits monthly for health monitoring. He was previously on Entresto  but discontinued due to concerns about kidney function after a hospital visit. His creatinine levels have since decreased to 1.35. He currently takes a daily baby aspirin  and metoprolol  for heart health.  He recalls a blood clot on the left side many years ago, prior to heart valve surgery. He is on aspirin  therapy.  Reports no shortness of breath nor dyspnea on exertion. Reports no chest pain, pressure, or tightness. No edema, orthopnea, PND. Reports no palpitations.   Discussed the use of AI scribe software for clinical note transcription with the patient, who gave verbal consent to proceed.   ROS: Pertinent ROS in HPI  Studies Reviewed      2D echo 09/2019 IMPRESSIONS    1. Difficult acoustic windows limit study . There is mild inferior,  inferoseptal hypokinesis. . Left ventricular ejection fraction, by  estimation, is 45 to 50%. The  left ventricle has mildly decreased  function. Left ventricular diastolic parameters are  consistent with Grade I diastolic dysfunction (impaired relaxation).   2. Right ventricular systolic function is normal. The right ventricular  size is  normal. There is normal pulmonary artery systolic pressure.   3. Left atrial size was mildly dilated.   4. The mitral valve is normal in structure. Mild mitral valve  regurgitation.   5. A native pulmonic valve is present in aortic position. Opens well. .  The aortic valve has been repaired/replaced. Aortic valve regurgitation is  mild.   6. Pulmonic valve allograft present Opens well. Normal gradients.   7. S/p Ross procedure with ascending aorta replacement . Aortic  root/ascending aorta has been repaired/replaced. There is mild dilatation  of the ascending aorta measuring 40 mm.   8. The inferior vena cava is normal in size with greater than 50%  respiratory variability, suggesting right atrial pressure of 3 mmHg.    Cardiac MRI 09/2018 FINDINGS: 1. Normal left ventricular size. Mildly reduced systolic function, global hypokinesis. Normal wall thickness.   There is no late gadolinium enhancement in the left ventricular myocardium.   Normal T1 myocardial nulling kinetics, suggests against a diagnosis of cardiac amyloidosis.   2. Normal right ventricular size, thickness. Mildly reduced systolic function. There are no regional wall motion abnormalities.   3.  Moderate left atrial enlargement.  Mid right atrial enlargement.   4. Sinuses 36 x 37 x 37 mm in short axis of the aortic valve. In the sagittal oblique candy cane aorta view, aortic root measure approximately 40 mm in a transverse, non-double oblique measurement. Mid ascending aorta measures 30 mm in transverse measurement. Arch 26 mm, proximal descending aorta 24 mm, mid descending 23 mm.   5. Aortic and mitral regurgitation. Aortic regurgitation appears moderate. Mitral valve regurgitation is not well assessed.   6.  Normal pericardium.  No pericardial effusion.   Extracardiac findings:   Probable hepatic cyst 26 mm. Compared to CT Abdomen pelvis report 04/28/2015, this is likely a stable finding.    IMPRESSION: 1. Normal T1 myocardial nulling kinetics, suggests against a diagnosis of cardiac amyloidosis.   2. There is no late gadolinium enhancement in the left ventricular myocardium. No evidence of infarction, infiltrative process, or cardiomyopathic process.   3. Mildly dilated aortic root. Measured in a transverse plane, the aortic root measures 40 mm. This measurement correlates with echocardiographic measurements.       Physical Exam VS:  BP (!) 171/54   Pulse 73   Ht 5' 2 (1.575 m)   Wt 143 lb 6.4 oz (65 kg)   SpO2 99%   BMI 26.23 kg/m        Wt Readings from Last 3 Encounters:  12/21/23 143 lb 6.4 oz (65 kg)  11/06/23 150 lb (68 kg)  09/13/22 145 lb (65.8 kg)    GEN: Well nourished, well developed in no acute distress NECK: No JVD; No carotid bruits CARDIAC: RRR, no murmurs, rubs, gallops RESPIRATORY:  Clear to auscultation without rales, wheezing or rhonchi  ABDOMEN: Soft, non-tender, non-distended EXTREMITIES:  No edema; No deformity   ASSESSMENT AND PLAN  Severe aortic valve regurgitation status post replacement Severe aortic valve regurgitation on recent echocardiogram. Asymptomatic for dizziness and lightheadedness, occasional dyspnea on exertion. - Schedule transesophageal echocardiogram (TEE) to evaluate aortic valve regurgitation.  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 Entresto  beneficial if renal function stable. -  Restart Entresto  at a low dose. - Monitor kidney function with repeat studies in one week. - Continue metoprolol  and aspirin  as prescribed.  Essential hypertension Elevated blood pressure today. Entresto  may aid in blood pressure management. - Restart Entresto  at a low dose. - Monitor blood pressure daily at home.  Chronic kidney disease stage 3a Chronic kidney disease stage 3a with creatinine 1.35 and GFR 54. Current renal function  allows low-dose Entresto  trial. - Monitor kidney function with repeat studies in one week. - Restart Entresto  at a low dose.     Informed Consent   Shared Decision Making/Informed Consent   The risks [esophageal damage, perforation (1:10,000 risk), bleeding, pharyngeal hematoma as well as other potential complications associated with conscious sedation including aspiration, arrhythmia, respiratory failure and death], benefits (treatment guidance and diagnostic support) and alternatives of a transesophageal echocardiogram were discussed in detail with Mr. Sanford and he is willing to proceed.      Dispo: He can follow-up after TEE   Signed, Orren LOISE Fabry, PA-C

## 2023-12-20 NOTE — Progress Notes (Unsigned)
 Cardiology Office Note   Date:  12/21/2023  ID:  Dale Sanford, DOB 1950-08-04, MRN [id number] PCP: Dale LITTIE.[name], MD (Inactive)  [facility] Providers Cardiologist:  [name], MD   History of Present Illness Dale Sanford is a 73 y.o. 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  History includes 2D echocardiogram 12/01/2015 showing mildly reduced LVEF of 40 to 45% with diffuse hypokinesis and grade 2 diastolic dysfunction.  Had stable aortic valve bioprosthesis at that time and aortic root was mildly dilated at 39 mm.  Nuclear stress test for preoperative cardiac clearance that showed EF of 44% with no ischemia.  Echocardiogram 05/01/2018 showed that LVEF decreased from 48-45 to 20-25 with moderate MR and moderate aortic regurgitation.  Losartan  was stopped and started on Entresto  with spironolactone  and underwent right and left LHC which showed normal luminal irregularities with no evidence of obstructive coronary artery disease.  There was a moderately to severely reduced LVEF of 30 to 35% with global hypokinesis.  Right heart catheterization showed mildly elevated filling pressures, mild pulmonary hypertension and mildly reduced cardiac output.  RA pressure 7 mmHg, RV pressure 39 over 3 mmHg, pulmonary wedge pressure 15 mm/h, PA pressure 39/11 with a mean of 24 mmHg.  Cardiac output was 3.45 with index of 2.16.  He was also ordered to get a nuclear imaging to assess for TTR amyloid but study on 05/31/2018 was equivocal.  Underwent cardiac MRI which showed EF 40% with no evidence of cardiac amyloid and no late gadolinium enhancement to suggest infarct or infiltrative process.  Aortic root was dilated at 40 mm.  Was seen 11/06/2023 and was doing well at that time.  Recently in the hospital for presyncope,  hyperkalemia and AKI.  Entresto  and spironolactone  were stopped.  Potassium was greater than 7 at that time and serum creatinine was 3.31.  Occasionally had some mild DOE.  Denied chest pain or pressure, PND, orthopnea, lower extremity edema, palpitations, or syncope.  Today, he presents with aortic valve replacement who presents for evaluation of severe aortic valve leakage.  He underwent aortic valve replacement in 2001. A recent surface echocardiogram revealed leakage of the aortic valve. 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 He denies dizziness or lightheadedness.  He monitors his blood pressure at home and has a history of elevated blood pressure. A nurse visits monthly for health monitoring. He was previously on Entresto  but discontinued due to concerns about kidney function after a hospital visit. His creatinine levels have since decreased to 1.35. He currently takes a daily baby aspirin  and metoprolol  for heart health.  He recalls a blood clot on the left side many years ago, prior to heart valve surgery. He is on aspirin  therapy.  Reports no shortness of breath nor dyspnea on exertion. Reports no chest pain, pressure, or tightness. No edema, orthopnea, PND. Reports no palpitations.   Discussed the use of AI scribe software for clinical note transcription with the patient, who gave verbal consent to proceed.   ROS: Pertinent ROS in HPI  Studies Reviewed      2D echo 09/2019 IMPRESSIONS    1. Difficult acoustic windows limit study . There is mild inferior,  inferoseptal hypokinesis. . Left ventricular ejection fraction, by  estimation, is 45 to 50%. The  left ventricle has mildly decreased  function. Left ventricular diastolic parameters are  consistent with Grade I diastolic dysfunction (impaired relaxation).   2. Right ventricular systolic function is normal. The right ventricular  size is  normal. There is normal pulmonary artery systolic pressure.   3. Left atrial size was mildly dilated.   4. The mitral valve is normal in structure. Mild mitral valve  regurgitation.   5. A native pulmonic valve is present in aortic position. Opens well. .  The aortic valve has been repaired/replaced. Aortic valve regurgitation is  mild.   6. Pulmonic valve allograft present Opens well. Normal gradients.   7. S/p Ross procedure with ascending aorta replacement . Aortic  root/ascending aorta has been repaired/replaced. There is mild dilatation  of the ascending aorta measuring 40 mm.   8. The inferior vena cava is normal in size with greater than 50%  respiratory variability, suggesting right atrial pressure of 3 mmHg.    Cardiac MRI 09/2018 FINDINGS: 1. Normal left ventricular size. Mildly reduced systolic function, global hypokinesis. Normal wall thickness.   There is no late gadolinium enhancement in the left ventricular myocardium.   Normal T1 myocardial nulling kinetics, suggests against a diagnosis of cardiac amyloidosis.   2. Normal right ventricular size, thickness. Mildly reduced systolic function. There are no regional wall motion abnormalities.   3.  Moderate left atrial enlargement.  Mid right atrial enlargement.   4. Sinuses 36 x 37 x 37 mm in short axis of the aortic valve. In the sagittal oblique candy cane aorta view, aortic root measure approximately 40 mm in a transverse, non-double oblique measurement. Mid ascending aorta measures 30 mm in transverse measurement. Arch 26 mm, proximal descending aorta 24 mm, mid descending 23 mm.   5. Aortic and mitral regurgitation. Aortic regurgitation appears moderate. Mitral valve regurgitation is not well assessed.   6.  Normal pericardium.  No pericardial effusion.   Extracardiac findings:   Probable hepatic cyst 26 mm. Compared to CT Abdomen pelvis report 04/28/2015, this is likely a stable finding.    IMPRESSION: 1. Normal T1 myocardial nulling kinetics, suggests against a diagnosis of cardiac amyloidosis.   2. There is no late gadolinium enhancement in the left ventricular myocardium. No evidence of infarction, infiltrative process, or cardiomyopathic process.   3. Mildly dilated aortic root. Measured in a transverse plane, the aortic root measures 40 mm. This measurement correlates with echocardiographic measurements.       Physical Exam VS:  BP (!) 171/54   Pulse 73   Ht 5' 2 (1.575 m)   Wt 143 lb 6.4 oz (65 kg)   SpO2 99%   BMI 26.23 kg/m        Wt Readings from Last 3 Encounters:  12/21/23 143 lb 6.4 oz (65 kg)  11/06/23 150 lb (68 kg)  09/13/22 145 lb (65.8 kg)    GEN: Well nourished, well developed in no acute distress NECK: No JVD; No carotid bruits CARDIAC: RRR, no murmurs, rubs, gallops RESPIRATORY:  Clear to auscultation without rales, wheezing or rhonchi  ABDOMEN: Soft, non-tender, non-distended EXTREMITIES:  No edema; No deformity   ASSESSMENT AND PLAN  Severe aortic valve regurgitation status post replacement Severe aortic valve regurgitation on recent echocardiogram. Asymptomatic for dizziness and lightheadedness, occasional dyspnea on exertion. - Schedule transesophageal echocardiogram (TEE) to evaluate aortic valve regurgitation.  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 Entresto  beneficial if renal function stable. -  Restart Entresto  at a low dose. - Monitor kidney function with repeat studies in one week. - Continue metoprolol  and aspirin  as prescribed.  Essential hypertension Elevated blood pressure today. Entresto  may aid in blood pressure management. - Restart Entresto  at a low dose. - Monitor blood pressure daily at home.  Chronic kidney disease stage 3a Chronic kidney disease stage 3a with creatinine 1.35 and GFR 54. Current renal function  allows low-dose Entresto  trial. - Monitor kidney function with repeat studies in one week. - Restart Entresto  at a low dose.     Informed Consent   Shared Decision Making/Informed Consent   The risks [esophageal damage, perforation (1:10,000 risk), bleeding, pharyngeal hematoma as well as other potential complications associated with conscious sedation including aspiration, arrhythmia, respiratory failure and death], benefits (treatment guidance and diagnostic support) and alternatives of a transesophageal echocardiogram were discussed in detail with Mr. Lopezmartinez and he is willing to proceed.      Dispo: He can follow-up after TEE   Signed, Orren LOISE Fabry, PA-C

## 2023-12-21 ENCOUNTER — Ambulatory Visit: Attending: Cardiology | Admitting: Physician Assistant

## 2023-12-21 ENCOUNTER — Other Ambulatory Visit: Payer: Self-pay

## 2023-12-21 ENCOUNTER — Encounter: Payer: Self-pay | Admitting: Physician Assistant

## 2023-12-21 VITALS — BP 171/54 | HR 73 | Ht 62.0 in | Wt 143.4 lb

## 2023-12-21 DIAGNOSIS — I719 Aortic aneurysm of unspecified site, without rupture: Secondary | ICD-10-CM

## 2023-12-21 DIAGNOSIS — I420 Dilated cardiomyopathy: Secondary | ICD-10-CM | POA: Diagnosis not present

## 2023-12-21 DIAGNOSIS — I359 Nonrheumatic aortic valve disorder, unspecified: Secondary | ICD-10-CM | POA: Diagnosis not present

## 2023-12-21 DIAGNOSIS — I5042 Chronic combined systolic (congestive) and diastolic (congestive) heart failure: Secondary | ICD-10-CM

## 2023-12-21 DIAGNOSIS — I10 Essential (primary) hypertension: Secondary | ICD-10-CM

## 2023-12-21 MED ORDER — SACUBITRIL-VALSARTAN 24-26 MG PO TABS: 1.0000 | ORAL_TABLET | Freq: Two times a day (BID) | ORAL | 1 refills | Status: DC

## 2023-12-21 NOTE — Patient Instructions (Addendum)
 Thank you for choosing [facility]!     Medication Instructions:  Begin Entresto  24-26mg . Take this tablet twice a day.  *If you need a refill on your cardiac medications before your next appointment, please call your pharmacy*   Lab Work: TODAY: BMET and CBC  Return for lab work in ONE week.................. BMET  Monitor your blood pressure for 2 weeks.   Record readings on the blood pressure log.  If you have labs (blood work) drawn today and your tests are completely normal, you will receive your results only by: MyChart Message (if you have MyChart) OR A paper copy in the mail If you have any lab test that is abnormal or we need to change your treatment, we will call you to review the results.   Testing/Procedures: TEE Your physician has requested that you have a TEE. During a TEE, sound waves are used to create images of your heart. It provides your doctor with information about the size and shape of your heart and how well your heart's chambers and valves are working. In this test, a transducer is attached to the end of a flexible tube that's guided down your throat and into your esophagus (the tube leading from you mouth to your stomach) to get a more detailed image of your heart. You are not awake for the procedure. Please see the instruction sheet given to you today.   Your next appointment:    10/17 at 8:50am    Provider:    Orren Fabry, PA   Follow-Up: At [facility], you and your health needs are our priority.  As part of our continuing mission to provide you with exceptional heart care, we have created designated Provider Care Teams.  These Care Teams include your primary Cardiologist (physician) and Advanced Practice Providers (APPs -  Physician Assistants and Nurse Practitioners) who all work together to provide you with the care you need, when you need it. We recommend signing up for the patient portal called MyChart.  Sign up information  is provided on this After Visit Summary.  MyChart is used to connect with patients for Virtual Visits (Telemedicine).  Patients are able to view lab/test results, encounter notes, upcoming appointments, etc.  Non-urgent messages can be sent to your provider as well.   To learn more about what you can do with MyChart, go to [email or web address].

## 2023-12-21 NOTE — Addendum Note (Signed)
 Addended by: CHAUVIGNE, Elajah Kunsman on: 12/21/2023 09:51 AM   Modules accepted: Orders

## 2023-12-22 ENCOUNTER — Other Ambulatory Visit: Payer: Self-pay | Admitting: Physician Assistant

## 2023-12-22 ENCOUNTER — Ambulatory Visit: Payer: Self-pay | Admitting: Physician Assistant

## 2023-12-22 DIAGNOSIS — I351 Nonrheumatic aortic (valve) insufficiency: Secondary | ICD-10-CM

## 2023-12-22 LAB — CBC
Hematocrit: 45.3 % (ref 37.5–51.0)
Hemoglobin: 14.2 g/dL (ref 13.0–17.7)
MCH: 27.8 pg (ref 26.6–33.0)
MCHC: 31.3 g/dL — ABNORMAL LOW (ref 31.5–35.7)
MCV: 89 fL (ref 79–97)
Platelets: 211 x10E3/uL (ref 150–450)
RBC: 5.1 x10E6/uL (ref 4.14–5.80)
RDW: 12.9 % (ref 11.6–15.4)
WBC: 10.7 x10E3/uL (ref 3.4–10.8)

## 2023-12-22 LAB — BASIC METABOLIC PANEL WITH GFR
BUN/Creatinine Ratio: 18 (ref 10–24)
BUN: 22 mg/dL (ref 8–27)
CO2: 23 mmol/L (ref 20–29)
Calcium: 9 mg/dL (ref 8.6–10.2)
Chloride: 101 mmol/L (ref 96–106)
Creatinine, Ser: 1.2 mg/dL (ref 0.76–1.27)
Glucose: 106 mg/dL — ABNORMAL HIGH (ref 70–99)
Potassium: 4.6 mmol/L (ref 3.5–5.2)
Sodium: 139 mmol/L (ref 134–144)
eGFR: 63 mL/min/1.73 (ref >59)

## 2023-12-22 NOTE — Progress Notes (Signed)
 Orders placed.

## 2023-12-25 NOTE — Anesthesia Preprocedure Evaluation (Signed)
 Anesthesia Evaluation  Patient identified by MRN, date of birth, ID band Patient awake    Reviewed: Allergy & Precautions, NPO status , Patient's Chart, lab work & pertinent test results  Airway Mallampati: III  TM Distance: >3 FB Neck ROM: Full    Dental  (+) Poor Dentition, Dental Advisory Given, Edentulous Upper, Partial Lower, Chipped, Missing   Pulmonary neg pulmonary ROS   Pulmonary exam normal breath sounds clear to auscultation       Cardiovascular hypertension, Pt. on home beta blockers +CHF  + Valvular Problems/Murmurs AI  Rhythm:Regular Rate:Normal  Echo 11/2023  1. Left ventricular ejection fraction, by estimation, is 35%-40%. The  left ventricle has moderately decreased function. The left ventricular  internal cavity size was mildly dilated. There is mild concentric left  ventricular hypertrophy. Left ventricular diastolic parameters are indeterminate.   2. Right ventricular systolic function is normal. The right ventricular  size is normal. There is normal pulmonary artery systolic pressure.   3. Left atrial size was mildly dilated.   4. The mitral valve is normal in structure. Mild to moderate mitral valve  regurgitation.   5. The aortic valve has been replaced s/p Ross procedure (2001). Aortic  valve regurgitation is eccentric and now appears severe. Aortic  regurgitation PHT measures 251 msec. Aortic valve area, by VTI measures  2.68 cm. Aortic valve mean gradient measures 4.0 mmHg.   6. Aortic dilatation noted. There is dilatation of the ascending aorta,  measuring 39 mm.    11/2015 - Left ventricle: The cavity size was normal. Wall thickness was normal. Systolic function was mildly to moderately reduced. The estimated ejection fraction was in the range of 40% to 45%. Diffuse hypokinesis. There is severe hypokinesis of the anteroseptal myocardium. Features are consistent with a pseudonormal left ventricular  filling pattern, with concomitant abnormal relaxation and increased filling pressure (grade 2 diastolic dysfunction). Doppler parameters are consistent with high ventricular filling pressure. - Aortic valve: A bioprosthesis was present. There was trivial regurgitation. - Aortic root: The aortic root was mildly dilated. - Mitral valve: There was mild regurgitation. - Left atrium: The atrium was mildly dilated.  Impressions: - Anteroseptal hypokinesis with overall mild to moderately reduced LV systolic function; grade 2 diastolic dysfunction with elevated LV filling pressure; s/p AVR with mild AI; mild MR; mild LAE; mild TR.   Neuro/Psych negative neurological ROS  negative psych ROS   GI/Hepatic Neg liver ROS,GERD  ,,Echo 11/2023  1. Left ventricular ejection fraction, by estimation, is 35%-40%. The  left ventricle has moderately decreased function. The left ventricular  internal cavity size was mildly dilated. There is mild concentric left  ventricular hypertrophy. Left ventricular   diastolic parameters are indeterminate.   2. Right ventricular systolic function is normal. The right ventricular  size is normal. There is normal pulmonary artery systolic pressure.   3. Left atrial size was mildly dilated.   4. The mitral valve is normal in structure. Mild to moderate mitral valve  regurgitation.   5. The aortic valve has been replaced s/p Ross procedure (2001). Aortic  valve regurgitation is eccentric and now appears severe. Aortic  regurgitation PHT measures 251 msec. Aortic valve area, by VTI measures  2.68 cm. Aortic valve mean gradient measures   4.0 mmHg.   6. Aortic dilatation noted. There is dilatation of the ascending aorta,  measuring 39 mm.     Endo/Other  negative endocrine ROS    Renal/GU Renal disease     Musculoskeletal negative  musculoskeletal ROS (+)    Abdominal   Peds  Hematology negative hematology ROS (+)   Anesthesia Other Findings    Reproductive/Obstetrics                              Anesthesia Physical Anesthesia Plan  ASA: 4  Anesthesia Plan: MAC   Post-op Pain Management: Minimal or no pain anticipated   Induction:   PONV Risk Score and Plan: 1 and Propofol  infusion, TIVA and Treatment may vary due to age or medical condition  Airway Management Planned:   Additional Equipment:   Intra-op Plan:   Post-operative Plan:   Informed Consent: I have reviewed the patients History and Physical, chart, labs and discussed the procedure including the risks, benefits and alternatives for the proposed anesthesia with the patient or authorized representative who has indicated his/her understanding and acceptance.     Dental advisory given  Plan Discussed with: CRNA  Anesthesia Plan Comments:          Anesthesia Quick Evaluation

## 2023-12-25 NOTE — Progress Notes (Signed)
 Pt called for pre procedure instructions.  Pre instructions reviewed with daughter.   Arrival time 1030 NPO after midnight explained Instructed to take am meds with sip of water. Instructed pt need for ride home tomorrow and have responsible adult with them for 24 hrs post procedure.

## 2023-12-26 ENCOUNTER — Encounter (HOSPITAL_COMMUNITY)
Admission: RE | Disposition: A | Discharge status: Home / Self Care | Payer: Self-pay | Source: Home / Self Care | Attending: Cardiology

## 2023-12-26 ENCOUNTER — Ambulatory Visit: Payer: Self-pay | Admitting: Physician Assistant

## 2023-12-26 ENCOUNTER — Ambulatory Visit (HOSPITAL_COMMUNITY)
Admission: RE | Admit: 2023-12-26 | Discharge: 2023-12-26 | Disposition: A | Source: Ambulatory Visit | Attending: Physician Assistant | Admitting: Physician Assistant

## 2023-12-26 ENCOUNTER — Ambulatory Visit (HOSPITAL_COMMUNITY)
Admission: RE | Admit: 2023-12-26 | Discharge: 2023-12-26 | Disposition: A | Discharge status: Home / Self Care | Attending: Cardiology | Admitting: Cardiology

## 2023-12-26 ENCOUNTER — Encounter (HOSPITAL_COMMUNITY): Payer: Self-pay | Admitting: Cardiology

## 2023-12-26 ENCOUNTER — Encounter (HOSPITAL_COMMUNITY): Payer: Self-pay | Admitting: Anesthesiology

## 2023-12-26 ENCOUNTER — Ambulatory Visit (HOSPITAL_COMMUNITY): Payer: Self-pay | Admitting: Anesthesiology

## 2023-12-26 ENCOUNTER — Other Ambulatory Visit: Payer: Self-pay

## 2023-12-26 DIAGNOSIS — Z952 Presence of prosthetic heart valve: Secondary | ICD-10-CM | POA: Diagnosis not present

## 2023-12-26 DIAGNOSIS — I351 Nonrheumatic aortic (valve) insufficiency: Secondary | ICD-10-CM

## 2023-12-26 DIAGNOSIS — Z79899 Other long term (current) drug therapy: Secondary | ICD-10-CM | POA: Diagnosis not present

## 2023-12-26 DIAGNOSIS — I5042 Chronic combined systolic (congestive) and diastolic (congestive) heart failure: Secondary | ICD-10-CM | POA: Diagnosis not present

## 2023-12-26 DIAGNOSIS — I130 Hypertensive heart and chronic kidney disease with heart failure and stage 1 through stage 4 chronic kidney disease, or unspecified chronic kidney disease: Secondary | ICD-10-CM | POA: Diagnosis not present

## 2023-12-26 DIAGNOSIS — I110 Hypertensive heart disease with heart failure: Secondary | ICD-10-CM | POA: Diagnosis not present

## 2023-12-26 DIAGNOSIS — Z7982 Long term (current) use of aspirin: Secondary | ICD-10-CM | POA: Diagnosis not present

## 2023-12-26 DIAGNOSIS — I77810 Thoracic aortic ectasia: Secondary | ICD-10-CM | POA: Insufficient documentation

## 2023-12-26 DIAGNOSIS — N1831 Chronic kidney disease, stage 3a: Secondary | ICD-10-CM | POA: Diagnosis not present

## 2023-12-26 DIAGNOSIS — I080 Rheumatic disorders of both mitral and aortic valves: Secondary | ICD-10-CM | POA: Insufficient documentation

## 2023-12-26 DIAGNOSIS — I340 Nonrheumatic mitral (valve) insufficiency: Secondary | ICD-10-CM

## 2023-12-26 HISTORY — PX: TRANSESOPHAGEAL ECHOCARDIOGRAM (CATH LAB): EP1270

## 2023-12-26 LAB — ECHO TEE

## 2023-12-26 SURGERY — TRANSESOPHAGEAL ECHOCARDIOGRAM (TEE) (CATHLAB)
Anesthesia: Monitor Anesthesia Care

## 2023-12-26 MED ORDER — PROPOFOL 10 MG/ML IV BOLUS
INTRAVENOUS | Status: DC | PRN
  Administered 2023-12-26 (×2): 20 mg via INTRAVENOUS
  Administered 2023-12-26: 80 ug/kg/min via INTRAVENOUS
  Administered 2023-12-26 (×2): 20 mg via INTRAVENOUS

## 2023-12-26 MED ORDER — PHENYLEPHRINE HCL (PRESSORS) 10 MG/ML IV SOLN
INTRAVENOUS | Status: DC | PRN
  Administered 2023-12-26 (×3): 80 ug via INTRAVENOUS
  Administered 2023-12-26: 40 ug via INTRAVENOUS
  Administered 2023-12-26: 80 ug via INTRAVENOUS

## 2023-12-26 MED ORDER — SODIUM CHLORIDE 0.9 % IV SOLN: INTRAVENOUS | Status: DC

## 2023-12-26 MED ORDER — SODIUM CHLORIDE 0.9 % IV SOLN: INTRAVENOUS | Status: DC | PRN

## 2023-12-26 NOTE — CV Procedure (Signed)
     TRANSESOPHAGEAL ECHOCARDIOGRAM   NAME:  Dale Sanford   MRN: [id number] DOB:  Mar 26, 1950   ADMIT DATE: 12/26/2023  INDICATIONS: AI  PROCEDURE:   Informed consent was obtained prior to the procedure. The risks, benefits and alternatives for the procedure were discussed and the patient comprehended these risks.  Risks include, but are not limited to, cough, sore throat, vomiting, nausea, somnolence, esophageal and stomach trauma or perforation, bleeding, low blood pressure, aspiration, pneumonia, infection, trauma to the teeth and death.    After a procedural time-out, the oropharynx was anesthetized and the patient was sedated by the anesthesia service. The transesophageal probe was inserted in the esophagus and stomach without difficulty and multiple views were obtained. Anesthesia was monitored by Josette Leisure, CRNA.    COMPLICATIONS:    There were no immediate complications.  FINDINGS: Moderate to severe AI    [name] MD [place]  [facility]    1:46 PM

## 2023-12-26 NOTE — Discharge Instructions (Signed)

## 2023-12-26 NOTE — Transfer of Care (Signed)
 Immediate Anesthesia Transfer of Care Note  Patient: Dale Sanford  Procedure(s) Performed: TRANSESOPHAGEAL ECHOCARDIOGRAM  Patient Location: PACU  Anesthesia Type:MAC  Level of Consciousness: awake  Airway & Oxygen  Therapy: Patient Spontanous Breathing  Post-op Assessment: Report given to RN  Post vital signs: stable  Last Vitals:  Vitals Value Taken Time  BP 105/91 12/26/23 11:58  Temp 36.3 C 12/26/23 11:58  Pulse 69 12/26/23 11:58  Resp 23 12/26/23 11:58  SpO2 96 % 12/26/23 11:58  Vitals shown include unfiled device data.  Last Pain:  Vitals:   12/26/23 1158  TempSrc: Tympanic  PainSc: Asleep         Complications: No notable events documented.

## 2023-12-27 NOTE — Interval H&P Note (Signed)
 History and Physical Interval Note:  12/27/2023 6:22 AM  Dale Sanford Search  has presented today for surgery, with the diagnosis of AORTIC REGURGITATION.  The various methods of treatment have been discussed with the patient and family. After consideration of risks, benefits and other options for treatment, the patient has consented to  Procedure(s): TRANSESOPHAGEAL ECHOCARDIOGRAM (N/A) as a surgical intervention.  The patient's history has been reviewed, patient examined, no change in status, stable for surgery.  I have reviewed the patient's chart and labs.  Questions were answered to the patient's satisfaction.     Dale Sanford

## 2023-12-27 NOTE — Anesthesia Postprocedure Evaluation (Signed)
 Anesthesia Post Note  Patient: Dale Sanford  Procedure(s) Performed: TRANSESOPHAGEAL ECHOCARDIOGRAM     Patient location during evaluation: Cath Lab Anesthesia Type: MAC Level of consciousness: awake and alert Pain management: pain level controlled Vital Signs Assessment: post-procedure vital signs reviewed and stable Respiratory status: spontaneous breathing Cardiovascular status: stable Anesthetic complications: no   No notable events documented.  Last Vitals:  Vitals:   12/26/23 1208 12/26/23 1218  BP: (!) 109/48 (!) 124/44  Pulse: 70 64  Resp: 19 16  Temp:    SpO2: 99% 100%    Last Pain:  Vitals:   12/26/23 1218  TempSrc:   PainSc: 0-No pain                 [name]

## 2024-01-01 NOTE — Telephone Encounter (Signed)
-----   Message from [name] sent at 12/27/2023 12:39 PM EDT ----- Regarding: RE: yes, please go ahead and order the cardiac MRI with contrast please this is for aortic insufficiency of a homograph ----- Message ----- From: Lucien Orren SAILOR, PA-C Sent: 12/26/2023   4:30 PM EDT To: [name], MD; [facility] Triage  Dale Sanford, Your aortic insufficiency or leaky aortic valve appears to be moderate to severe.  You also had a mild to moderate leak of your mitral valve.  Ejection fraction is still less than ideal (35 to 40%)  normal is 65%.  How are you tolerating the Entresto ?  This study did recommend a cardiac MRI and would give us  a better idea of what your pulmonary homograft looks like.  I will defer to Dr. Bihari  on whether this needs to be ordered or not.  Please let me know if you have questions.  Thanks  Orren SAILOR Lucien, PA-C  ----- Message ----- From: Interface, Three One Seven Sent: 12/26/2023   1:46 PM EDT To: Orren SAILOR Lucien, PA-C

## 2024-01-01 NOTE — Telephone Encounter (Signed)
 Attempted to call patient to discuss echo results and to schedule cardiac MRI. No answer. No VM, no updated DPR on file. [facility] sent.

## 2024-01-05 ENCOUNTER — Ambulatory Visit: Admitting: Physician Assistant

## 2024-01-05 ENCOUNTER — Other Ambulatory Visit: Payer: Self-pay | Admitting: Cardiology

## 2024-01-18 NOTE — Progress Notes (Unsigned)
 Cardiology Office Note   Date:  01/19/2024  ID:  Dale, Sanford [birth year], MRN [id number] PCP: Merilee LITTIE.[name], MD (Inactive)  [facility] Providers Cardiologist:  [name], MD   History of Present Illness Dale Sanford is a 72 y.o. 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  History includes 2D echocardiogram 12/01/2015 showing mildly reduced LVEF of 40 to 45% with diffuse hypokinesis and grade 2 diastolic dysfunction.  Had stable aortic valve bioprosthesis at that time and aortic root was mildly dilated at 39 mm.  Nuclear stress test for preoperative cardiac clearance that showed EF of 44% with no ischemia.  Echocardiogram 05/01/2018 showed that LVEF decreased from 48-45 to 20-25 with moderate MR and moderate aortic regurgitation.  Losartan  was stopped and started on Entresto  with spironolactone  and underwent right and left LHC which showed normal luminal irregularities with no evidence of obstructive coronary artery disease.  There was a moderately to severely reduced LVEF of 30 to 35% with global hypokinesis.  Right heart catheterization showed mildly elevated filling pressures, mild pulmonary hypertension and mildly reduced cardiac output.  RA pressure 7 mmHg, RV pressure 39 over 3 mmHg, pulmonary wedge pressure 15 mm/h, PA pressure 39/11 with a mean of 24 mmHg.  Cardiac output was 3.45 with index of 2.16.  He was also ordered to get a nuclear imaging to assess for TTR amyloid but study on 05/31/2018 was equivocal.  Underwent cardiac MRI which showed EF 40% with no evidence of cardiac amyloid and no late gadolinium enhancement to suggest infarct or infiltrative process.  Aortic root was dilated at 40 mm.  Was seen 11/06/2023 and was doing well at that time.  Recently in the hospital for presyncope,  hyperkalemia and AKI.  Entresto  and spironolactone  were stopped.  Potassium was greater than 7 at that time and serum creatinine was 3.31.  Occasionally had some mild DOE.  Denied chest pain or pressure, PND, orthopnea, lower extremity edema, palpitations, or syncope.  I saw him 12/21/2023, he presents with aortic valve replacement who presents for evaluation of severe aortic valve leakage.  He underwent aortic valve replacement in 2001. A recent surface echocardiogram revealed leakage of the aortic valve. 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 He denies dizziness or lightheadedness.  He monitors his blood pressure at home and has a history of elevated blood pressure. A nurse visits monthly for health monitoring. He was previously on Entresto  but discontinued due to concerns about kidney function after a hospital visit. His creatinine levels have since decreased to 1.35. He currently takes a daily baby aspirin  and metoprolol  for heart health.  He recalls a blood clot on the left side many years ago, prior to heart valve surgery. He is on aspirin  therapy.  Reports no shortness of breath nor dyspnea on exertion. Reports no chest pain, pressure, or tightness. No edema, orthopnea, PND. Reports no palpitations.   Discussed the use of AI scribe software for clinical note transcription with the patient, who gave verbal consent to proceed.  Today, he presents with a hx of aortic valve disease and mitral valve regurgitation who presents for follow-up of his heart condition.  A recent transesophageal echocardiogram shows moderate to severe aortic valve disease and mild to moderate mitral valve regurgitation. He is on Entresto   25 mg. Blood pressure readings show low diastolic and slightly high systolic values. Shortness of breath is stable but requires breaks during activities like mowing the yard. Occasional chest discomfort  that he feels is reflux since it occurs at night, resolves quickly.  No new symptoms such as dizziness or lightheadedness. He monitors his blood pressure regularly.   No edema, orthopnea, PND. Reports no palpitations.   Discussed the use of AI scribe software for clinical note transcription with the patient, who gave verbal consent to proceed.   ROS: Pertinent ROS in HPI  Studies Reviewed  TEE 12/26/2023  IMPRESSIONS     1. Left ventricular ejection fraction, by estimation, is 35 to 40%. The  left ventricle has moderately decreased function. The left ventricle  demonstrates global hypokinesis.   2. Right ventricular systolic function is normal. The right ventricular  size is normal.   3. The mitral valve is normal in structure. Mild to moderate mitral valve  regurgitation.   4. 3D performed of the aortic valve and demonstrates moderate to severe  AI.   5. Left atrial size was mildly dilated. No left atrial/left atrial  appendage thrombus was detected.   6. Aortic dilatation noted. There is dilatation of the aortic root,  measuring 40 mm.   7. The aortic valve has been repaired/replaced. Aortic valve  regurgitation is moderate to severe. No aortic stenosis. s/p Ross  procedure. AI appeas moderate to severe: 2D VCA 0.6 cm^2, 3D VCA 0.4 cm^2,  PHT , no clear holodiastolic flow reversal in  descending aorta. Overall, appears moderate to severe AI. Recommend  cardiac MRI to quantify AI. Cardiac MRI would also allow better assessment  of pulmonary homograft, which is not well visualized on this study       2D echo 09/2019 IMPRESSIONS    1. Difficult acoustic windows limit study . There is mild inferior,  inferoseptal hypokinesis. . Left ventricular ejection fraction, by  estimation, is 45 to 50%. The left ventricle has mildly decreased  function. Left ventricular diastolic parameters are  consistent with Grade I diastolic dysfunction (impaired relaxation).   2. Right  ventricular systolic function is normal. The right ventricular  size is normal. There is normal pulmonary artery systolic pressure.   3. Left atrial size was mildly dilated.   4. The mitral valve is normal in structure. Mild mitral valve  regurgitation.   5. A native pulmonic valve is present in aortic position. Opens well. .  The aortic valve has been repaired/replaced. Aortic valve regurgitation is  mild.   6. Pulmonic valve allograft present Opens well. Normal gradients.   7. S/p Ross procedure with ascending aorta replacement . Aortic  root/ascending aorta has been repaired/replaced. There is mild dilatation  of the ascending aorta measuring 40 mm.   8. The inferior vena cava is normal in size with greater than 50%  respiratory variability, suggesting right atrial pressure of 3 mmHg.    Cardiac MRI 09/2018 FINDINGS: 1. Normal left ventricular size. Mildly reduced systolic function, global hypokinesis. Normal wall thickness.   There is no late gadolinium enhancement in the left ventricular myocardium.   Normal T1 myocardial nulling kinetics, suggests against a diagnosis of cardiac amyloidosis.   2. Normal right ventricular size, thickness. Mildly reduced systolic function. There are no regional wall motion abnormalities.   3.  Moderate left atrial enlargement.  Mid right atrial enlargement.   4. Sinuses 36 x 37 x 37 mm in short axis of the aortic valve. In  the sagittal oblique candy cane aorta view, aortic root measure approximately 40 mm in a transverse, non-double oblique measurement. Mid ascending aorta measures 30 mm in transverse measurement. Arch 26 mm, proximal descending aorta 24 mm, mid descending 23 mm.   5. Aortic and mitral regurgitation. Aortic regurgitation appears moderate. Mitral valve regurgitation is not well assessed.   6.  Normal pericardium.  No pericardial effusion.   Extracardiac findings:   Probable hepatic cyst 26 mm. Compared to CT Abdomen  pelvis report 04/28/2015, this is likely a stable finding.   IMPRESSION: 1. Normal T1 myocardial nulling kinetics, suggests against a diagnosis of cardiac amyloidosis.   2. There is no late gadolinium enhancement in the left ventricular myocardium. No evidence of infarction, infiltrative process, or cardiomyopathic process.   3. Mildly dilated aortic root. Measured in a transverse plane, the aortic root measures 40 mm. This measurement correlates with echocardiographic measurements.       Physical Exam VS:  BP (!) 138/46   Pulse 75   Ht 5' 1 (1.549 m)   Wt 145 lb 12.8 oz (66.1 kg)   SpO2 99%   BMI 27.55 kg/m        Wt Readings from Last 3 Encounters:  01/19/24 145 lb 12.8 oz (66.1 kg)  12/26/23 143 lb (64.9 kg)  12/21/23 143 lb 6.4 oz (65 kg)    GEN: Well nourished, well developed in no acute distress NECK: No JVD; No carotid bruits CARDIAC: RRR, no murmurs, rubs, gallops RESPIRATORY:  Clear to auscultation without rales, wheezing or rhonchi  ABDOMEN: Soft, non-tender, non-distended EXTREMITIES:  No edema; No deformity   ASSESSMENT AND PLAN  Severe aortic valve regurgitation status post replacement Moderate to severe regurg on TEE, asymptomatic, no intervention required. - Send TEE results to Dr. Shlomo for review the need for cardiac MRI  Mitral valve regurgitation Mild to moderate regurgitation on TEE, stable, no intervention required.  Heart failure with reduced ejection fraction On Entresto , low diastolic and slightly elevated systolic pressure, no new dizziness or lightheadedness. - Continue Entresto  at 24-26mg  BID - Monitor blood pressure regularly.  Shortness of breath Stable, possibly due to age and mitral valve regurgitation, no significant worsening. - Report any increase in shortness of breath or decrease in activity tolerance.  Chest discomfort Intermittent, resolves upon lying down, possibly gastrointestinal. - Report any increase in frequency or  severity. -continue current medications -cardiac cath from 2020 with minimal disease in his coronaries       Dispo: He can follow-up in 5 months with Dr. Shlomo  Signed, Orren LOISE Fabry, PA-C

## 2024-01-19 ENCOUNTER — Ambulatory Visit: Attending: Physician Assistant | Admitting: Physician Assistant

## 2024-01-19 ENCOUNTER — Encounter: Payer: Self-pay | Admitting: Physician Assistant

## 2024-01-19 VITALS — BP 138/46 | HR 75 | Ht 61.0 in | Wt 145.8 lb

## 2024-01-19 DIAGNOSIS — I5042 Chronic combined systolic (congestive) and diastolic (congestive) heart failure: Secondary | ICD-10-CM

## 2024-01-19 DIAGNOSIS — I420 Dilated cardiomyopathy: Secondary | ICD-10-CM

## 2024-01-19 DIAGNOSIS — I351 Nonrheumatic aortic (valve) insufficiency: Secondary | ICD-10-CM

## 2024-01-19 DIAGNOSIS — I10 Essential (primary) hypertension: Secondary | ICD-10-CM

## 2024-01-19 DIAGNOSIS — I719 Aortic aneurysm of unspecified site, without rupture: Secondary | ICD-10-CM

## 2024-01-19 MED ORDER — SACUBITRIL-VALSARTAN 24-26 MG PO TABS: 1.0000 | ORAL_TABLET | Freq: Two times a day (BID) | ORAL | 3 refills | Status: DC

## 2024-01-19 NOTE — Patient Instructions (Signed)
 Medication Instructions:   Your physician recommends that you continue on your current medications as directed. Please refer to the Current Medication list given to you today.   *If you need a refill on your cardiac medications before your next appointment, please call your pharmacy*  Lab Work: NONE ORDERED  TODAY    If you have labs (blood work) drawn today and your tests are completely normal, you will receive your results only by: MyChart Message (if you have MyChart) OR A paper copy in the mail If you have any lab test that is abnormal or we need to change your treatment, we will call you to review the results.  Testing/Procedures: NONE ORDERED  TODAY    Follow-Up: At [facility], you and your health needs are our priority.  As part of our continuing mission to provide you with exceptional heart care, our providers are all part of one team.  This team includes your primary Cardiologist (physician) and Advanced Practice Providers or APPs (Physician Assistants and Nurse Practitioners) who all work together to provide you with the care you need, when you need it.  Your next appointment:  5 month(s)    Provider:    [name], MD     We recommend signing up for the patient portal called MyChart.  Sign up information is provided on this After Visit Summary.  MyChart is used to connect with patients for Virtual Visits (Telemedicine).  Patients are able to view lab/test results, encounter notes, upcoming appointments, etc.  Non-urgent messages can be sent to your provider as well.   To learn more about what you can do with MyChart, go to [email or web address].   Other Instructions

## 2024-01-22 ENCOUNTER — Telehealth: Payer: Self-pay

## 2024-01-22 DIAGNOSIS — I420 Dilated cardiomyopathy: Secondary | ICD-10-CM

## 2024-01-22 DIAGNOSIS — I719 Aortic aneurysm of unspecified site, without rupture: Secondary | ICD-10-CM

## 2024-01-22 DIAGNOSIS — I351 Nonrheumatic aortic (valve) insufficiency: Secondary | ICD-10-CM

## 2024-01-22 NOTE — Telephone Encounter (Signed)
-----   Message from Orren LOISE Fabry sent at 01/22/2024 10:44 AM EST ----- Can we order cardiac MRI on this patient with contrast please for a further look at his aortic graft.   Thanks! Orren LOISE Fabry, PA-C ----- Message ----- From: Shlomo Wilbert SAUNDERS, MD Sent: 01/19/2024   8:33 PM EST To: Orren LOISE Fabry, PA-C  Yes please get cardiac MRI with contrast ----- Message ----- From: Fabry Orren [name] Sent: 01/19/2024  11:06 AM EDT To: Wilbert SAUNDERS Shlomo, MD  I wasn't sure if you saw his TEE but I just need your opinion on if a cardiac MRI is needed. His symptoms are well managed on his medications currently. He will see you in the spring, thanks!

## 2024-02-14 ENCOUNTER — Other Ambulatory Visit: Payer: Self-pay | Admitting: Physician Assistant

## 2024-03-18 ENCOUNTER — Telehealth: Payer: Self-pay | Admitting: Cardiology

## 2024-03-18 NOTE — Telephone Encounter (Signed)
 Pt c/o medication issue:  1. Name of Medication:   sacubitril -valsartan  (ENTRESTO ) 24-26 MG    2. How are you currently taking this medication (dosage and times per day)? Take 1 tablet by mouth 2 (two) times daily.   3. Are you having a reaction (difficulty breathing--STAT)? No  4. What is your medication issue? Corean from [facility] is calling she would like to know if the patient can have the generic brand instead of the Entresto . Please advise.

## 2024-03-20 NOTE — Telephone Encounter (Signed)
 Spoke to [place] at [place], advised that generic is ok per Dr. Shlomo.

## 2024-04-01 ENCOUNTER — Encounter (HOSPITAL_COMMUNITY): Payer: Self-pay

## 2024-04-03 ENCOUNTER — Other Ambulatory Visit: Payer: Self-pay | Admitting: Physician Assistant

## 2024-04-03 ENCOUNTER — Ambulatory Visit (HOSPITAL_COMMUNITY)
Admission: RE | Admit: 2024-04-03 | Discharge: 2024-04-03 | Disposition: A | Discharge status: Home / Self Care | Source: Ambulatory Visit | Attending: Physician Assistant | Admitting: Physician Assistant

## 2024-04-03 DIAGNOSIS — I351 Nonrheumatic aortic (valve) insufficiency: Secondary | ICD-10-CM | POA: Insufficient documentation

## 2024-04-03 DIAGNOSIS — I719 Aortic aneurysm of unspecified site, without rupture: Secondary | ICD-10-CM

## 2024-04-03 DIAGNOSIS — I420 Dilated cardiomyopathy: Secondary | ICD-10-CM | POA: Diagnosis not present

## 2024-04-03 MED ORDER — LORAZEPAM 2 MG/ML IJ SOLN
1.0000 mg | Freq: Once | INTRAMUSCULAR | Status: AC
  Administered 2024-04-03: 1 mg via INTRAVENOUS

## 2024-04-03 MED ORDER — GADOBUTROL 1 MMOL/ML IV SOLN
10.0000 mL | Freq: Once | INTRAVENOUS | Status: AC | PRN
  Administered 2024-04-03: 10 mL via INTRAVENOUS

## 2024-04-03 MED ORDER — LORAZEPAM 2 MG/ML IJ SOLN
INTRAMUSCULAR | Status: AC
  Filled 2024-04-03: qty 1

## 2024-04-04 ENCOUNTER — Ambulatory Visit: Payer: Self-pay | Admitting: Physician Assistant

## 2024-04-08 NOTE — Telephone Encounter (Signed)
-----   Message from [name], MD sent at 04/06/2024  9:03 AM EST ----- Dale Sanford can you call patient and tell him he needs a right and left heart cath and get him consented and set up with Dr. Wonda for R and LHC please. ----- Message ----- From: Wonda Sharper, MD Sent: 04/06/2024   2:22 AM EST To: Tinnie LELON Daring, RN; [name], MD; Tier#  Sure. Happy to do it. Thx ----- Message ----- From: Bihari Wilbert JONELLE, MD Sent: 04/05/2024   9:19 AM EST To: Tinnie LELON Daring, RN; Sharper Wonda, MD; Tier#  Cannot agile the right and left heart cath with you Garrel ----- Message ----- From: Wonda Sharper, MD Sent: 04/04/2024   5:04 PM EST To: Tinnie LELON Daring, RN; [name], MD; Tier#  I can see but there's no structural intervention for severe AI. If you think he needs surgery I would just schedule for R/L cath and surgical referral.  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 Glassburn,  Your cardiac MRI showed a mildly dilated left lower chamber of your heart with a moderately reduced heart pump function of 39%.  You do have severe eccentric aortic regurgitation.  You had mild  pulmonary regurgitation or leak.  Findings were suggestive of nonischemic cardiomyopathy or reduced heart pump function not due to coronary disease.  I am going to send these results to Dr. Bihari so she is aware. You have follow-up with her in March.  Thanks! Dale Sanford LOISE Lucien, PA-C

## 2024-04-08 NOTE — Telephone Encounter (Signed)
 Called every phone # listed in chart, no answer, no ability to leave VM. Sent [facility] message. Appointment booked for 04/10/24.

## 2024-04-08 NOTE — Telephone Encounter (Signed)
" °  Cardiology Office Note   Date:  04/08/2024  ID:  Dale Sanford, DOB 04/07/1950, MRN [id number] PCP: Merilee LITTIE.[name], MD (Inactive)  [facility] Providers Cardiologist:  [name], MD    "

## 2024-04-10 ENCOUNTER — Telehealth: Payer: Self-pay | Admitting: *Deleted

## 2024-04-10 ENCOUNTER — Ambulatory Visit: Attending: Cardiology | Admitting: Cardiology

## 2024-04-10 ENCOUNTER — Encounter: Payer: Self-pay | Admitting: Cardiology

## 2024-04-10 VITALS — BP 146/56 | HR 75 | Ht 61.0 in | Wt 148.8 lb

## 2024-04-10 DIAGNOSIS — I77810 Thoracic aortic ectasia: Secondary | ICD-10-CM

## 2024-04-10 DIAGNOSIS — I10 Essential (primary) hypertension: Secondary | ICD-10-CM

## 2024-04-10 DIAGNOSIS — I5042 Chronic combined systolic (congestive) and diastolic (congestive) heart failure: Secondary | ICD-10-CM

## 2024-04-10 DIAGNOSIS — G4733 Obstructive sleep apnea (adult) (pediatric): Secondary | ICD-10-CM

## 2024-04-10 DIAGNOSIS — I420 Dilated cardiomyopathy: Secondary | ICD-10-CM | POA: Diagnosis not present

## 2024-04-10 DIAGNOSIS — I351 Nonrheumatic aortic (valve) insufficiency: Secondary | ICD-10-CM

## 2024-04-10 MED ORDER — SACUBITRIL-VALSARTAN 24-26 MG PO TABS: 1.0000 | ORAL_TABLET | Freq: Two times a day (BID) | ORAL | 3 refills | Status: AC

## 2024-04-10 NOTE — Patient Instructions (Signed)
 Medication Instructions:  Your physician recommends that you continue on your current medications as directed. Please refer to the Current Medication list given to you today.   *If you need a refill on your cardiac medications before your next appointment, please call your pharmacy*  Lab Work: BMP and CBC at [facility]   If you have labs (blood work) drawn today and your tests are completely normal, you will receive your results only by: MyChart Message (if you have MyChart) OR A paper copy in the mail If you have any lab test that is abnormal or we need to change your treatment, we will call you to review the results.  Testing/Procedures: Your physician has requested that you have a cardiac catheterization. Cardiac catheterization is used to diagnose and/or treat various heart conditions. Doctors may recommend this procedure for a number of different reasons. The most common reason is to evaluate chest pain. Chest pain can be a symptom of coronary artery disease (CAD), and cardiac catheterization can show whether plaque is narrowing or blocking your hearts arteries. This procedure is also used to evaluate the valves, as well as measure the blood flow and oxygen  levels in different parts of your heart. For further information please visit [email or web address] Please follow instruction sheet, as given.   Follow-Up: At [facility], you and your health needs are our priority.  As part of our continuing mission to provide you with exceptional heart care, our providers are all part of one team.  This team includes your primary Cardiologist (physician) and Advanced Practice Providers or APPs (Physician Assistants and Nurse Practitioners) who all work together to provide you with the care you need, when you need it.  Your next appointment:   3 month(s)  Provider:   [name], MD     Other Instructions  You are scheduled for a Cardiac Catheterization on Tuesday, February 3 with Dr.  [name].  1. Please arrive at the [facility] (Main Entrance A) at [facility]: [address] [place], [place] [address] at 7:00 AM (This time is 2 hour(s) before your procedure to ensure your preparation).   Free valet parking service is available. You will check in at ADMITTING. The support person will be asked to wait in the waiting room.  It is OK to have someone drop you off and come back when you are ready to be discharged.    Special note: Every effort is made to have your procedure done on time. Please understand that emergencies sometimes delay scheduled procedures.  2. Diet: Nothing to eat after midnight.   3. Hydration: On February 3, you may drink approved liquids (see below) until 2 hours before the procedure with 8 oz of water as your last intake.   List of approved liquids water, clear juice, clear tea, black coffee, fruit juices, non-citric and without pulp, carbonated beverages, Gatorade, Kool -Aid, plain Jello-O and plain ice popsicles.  4. Labs: You will need to have blood drawn TODAY.  You do not need to be fasting.  5. Medication instructions in preparation for your procedure:   Contrast Allergy: No   On the morning of your procedure, take your Aspirin  81 mg and any morning medicines NOT listed above.  You may use sips of water.  6. Plan to go home the same day, you will only stay overnight if medically necessary. 7. Bring a current list of your medications and current insurance cards. 8. You MUST have a responsible person to  drive you home. 9. Someone MUST be with you the first 24 hours after you arrive home or your discharge will be delayed. 10. Please wear clothes that are easy to get on and off and wear slip-on shoes.  Thank you for allowing us  to care for you!   -- [facility]

## 2024-04-10 NOTE — Telephone Encounter (Signed)
-----   Message from [name], MD sent at 04/10/2024  1:12 PM EST ----- Order new PAP supplies and mask of choice

## 2024-04-10 NOTE — Progress Notes (Signed)
 "   Date:  04/10/2024   ID:  Dale Sanford, DOB [birth year], MRN [id number]   PCP:  Merilee LITTIE.[name], MD (Inactive)  Cardiologist:  [name], MD  Electrophysiologist:  None   Chief Complaint:  CHF, HTN, AVR, DCM  History of Present Illness:    Dale Sanford is a 74 y.o. 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  2D echocardiogram 12/01/2015 showed mildly reduced LV systolic function with EF 40 to 45% with diffuse hypokinesis and grade 2 diastolic dysfunction.  He had a stable aortic valve bioprosthesis at that time and aortic root was mildly dilated at 39 mm.  Nuclear stress test for preoperative cardiac clearance showed an EF of 44% with no ischemia.   Echo 05/01/2018 showed that LVEF decreased from 40-45% to 20-25% with moderate MR and moderate aortic regurgitation.  His Losartan  was stopped and started Entresto  and spironolactone  and underwent right and LHC which showed minimal luminal irregularities with no evidence of obstructive coronary artery disease. There was moderately to severely reduced LV systolic function with an EF of 30 to 35% with global hypokinesis.  Right heart catheterization showed mildly elevated filling pressures, mild pulmonary hypertension and mildly reduced cardiac output.  RA pressure: 7 mmHg, RV pressure 39/3 mmHg, pulmonary capillary wedge pressure: 15 mmHg, PA pressure: 39/11 with a mean of 24 mmHg.  Cardiac output was 3.45 with an index of 2.16. He was also ordered to get nuclear imaging to assess for TTR amyloid but study on 05/31/18 was equivocal. He underwent cardiac MRI which which showed EF 40% with no evidence of cardiac amyloid and no late Gad enhancement to suggest infarct or infiltrative process.  Aortic root was dilated at 40mm.  His Entresto  and Spiro were stopped due to [facility] and hyperkalemia.  He is now back on Entresto   The patient is  here today and is doing well.  He has noticed recently that he gets tired with activities he used to be able to do.  He also has had some problems with DOE which is new. He denies any CP or pressure, PND, orthopnea, LE edema, dizziness, palpitations or syncope.    Prior CV studies:   The following studies were reviewed today:  2D echo 09/2019 IMPRESSIONS    1. Difficult acoustic windows limit study . There is mild inferior,  inferoseptal hypokinesis. . Left ventricular ejection fraction, by  estimation, is 45 to 50%. The left ventricle has mildly decreased  function. Left ventricular diastolic parameters are  consistent with Grade I diastolic dysfunction (impaired relaxation).   2. Right ventricular systolic function is normal. The right ventricular  size is normal. There is normal pulmonary artery systolic pressure.   3. Left atrial size was mildly dilated.   4. The mitral valve is normal in structure. Mild mitral valve  regurgitation.   5. A native pulmonic valve is present in aortic position. Opens well. .  The aortic valve has been repaired/replaced. Aortic valve regurgitation is  mild.   6. Pulmonic valve allograft present Opens well. Normal gradients.   7. S/p Ross procedure with ascending aorta replacement . Aortic  root/ascending aorta has been repaired/replaced. There is mild dilatation  of the ascending aorta measuring 40 mm.   8. The inferior vena cava is normal in size with greater than 50%  respiratory variability, suggesting right atrial pressure of 3 mmHg.  Cardiac MRI 09/2018 FINDINGS: 1. Normal left ventricular size. Mildly reduced systolic function, global hypokinesis. Normal wall thickness.   There is no late gadolinium enhancement in the left ventricular myocardium.   Normal T1 myocardial nulling kinetics, suggests against a diagnosis of cardiac amyloidosis.   2. Normal right ventricular size, thickness. Mildly reduced systolic function. There are no  regional wall motion abnormalities.   3.  Moderate left atrial enlargement.  Mid right atrial enlargement.   4. Sinuses 36 x 37 x 37 mm in short axis of the aortic valve. In the sagittal oblique candy cane aorta view, aortic root measure approximately 40 mm in a transverse, non-double oblique measurement. Mid ascending aorta measures 30 mm in transverse measurement. Arch 26 mm, proximal descending aorta 24 mm, mid descending 23 mm.   5. Aortic and mitral regurgitation. Aortic regurgitation appears moderate. Mitral valve regurgitation is not well assessed.   6.  Normal pericardium.  No pericardial effusion.   Extracardiac findings:   Probable hepatic cyst 26 mm. Compared to CT Abdomen pelvis report 04/28/2015, this is likely a stable finding.   IMPRESSION: 1. Normal T1 myocardial nulling kinetics, suggests against a diagnosis of cardiac amyloidosis.   2. There is no late gadolinium enhancement in the left ventricular myocardium. No evidence of infarction, infiltrative process, or cardiomyopathic process.   3. Mildly dilated aortic root. Measured in a transverse plane, the aortic root measures 40 mm. This measurement correlates with echocardiographic measurements.  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  Cardiac MRI with EF 39% 11/2023.   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/1999   infectious   RIGHT/LEFT HEART CATH AND CORONARY ANGIOGRAPHY N/A 05/11/2018   Procedure: RIGHT/LEFT HEART CATH AND CORONARY ANGIOGRAPHY;  Surgeon: Darron Deatrice LABOR, MD;  Location: [facility] INVASIVE CV LAB;  Service: Cardiovascular;  Laterality: N/A;   TOTAL HIP ARTHROPLASTY Right 02/18/2016   Procedure: RIGHT TOTAL HIP ARTHROPLASTY ANTERIOR APPROACH;  Surgeon: Redell Shoals, MD;  Location: [facility] ORS;  Service: Orthopedics;  Laterality: Right;   TRANSESOPHAGEAL ECHOCARDIOGRAM (CATH LAB) N/A 12/26/2023   Procedure: TRANSESOPHAGEAL ECHOCARDIOGRAM;  Surgeon: Kate Lonni CROME, MD;  Location: [facility] INVASIVE CV LAB;  Service: Cardiovascular;  Laterality: N/A;     Current Meds  Medication Sig   aspirin  EC 81 MG tablet Take 81 mg by mouth daily.   loperamide  (IMODIUM ) 2 MG capsule Take 1 capsule (2 mg total) by mouth every 8 (eight) hours as needed for diarrhea or loose stools.   metoprolol  (TOPROL -XL) 200 MG 24 hr tablet TAKE 1 TABLET BY MOUTH EVERY DAY   [DISCONTINUED] sacubitril -valsartan  (ENTRESTO ) 24-26 MG Take 1 tablet by mouth 2 (two) times daily.     Allergies:   Nyquil multi-symptom [pseudoeph-doxylamine-dm-apap]   Social History   Tobacco Use  Smoking status: Never   Smokeless tobacco: Never  Vaping Use   Vaping status: Never Used  Substance Use Topics   Alcohol  use: No    Comment: quit in 2008   Drug use: No     Family Hx: The patient's family history includes CAD in his father; Cancer in his brother; Cirrhosis in his brother.  ROS:   Please see the history of present illness.     All other systems reviewed and are negative.   Labs/Other Tests and Data Reviewed:    EKG Interpretation Date/Time:  Wednesday April 10 2024 13:04:38 EST Ventricular Rate:  75 PR Interval:  232 QRS Duration:  112 QT Interval:  414 QTC Calculation: 462 R Axis:   -29  Text Interpretation: Sinus  rhythm with 1st degree A-V block Incomplete left bundle branch block Left ventricular hypertrophy with repolarization abnormality ( R in aVL , Sokolow-Lyon , Cornell product , Romhilt-Estes ) When compared with ECG of 16-Oct-2023 18:36, T wave inversion now evident in Inferior leads T wave inversion more evident in Anterolateral leads Confirmed by Shlomo Corning ([id number]) on 04/10/2024 1:08:06 PM    Recent Labs: 10/16/2023: ALT 22 10/19/2023: Magnesium  1.7 12/21/2023: BUN 22; Creatinine, Ser 1.20; Hemoglobin 14.2; Platelets 211; Potassium 4.6; Sodium 139   Recent Lipid Panel Lab Results  Component Value Date/Time   CHOL 126 04/20/2020 09:12 AM   TRIG 66 04/20/2020 09:12 AM   HDL 38 (L) 04/20/2020 09:12 AM   CHOLHDL 3.3 04/20/2020 09:12 AM   LDLCALC 74 04/20/2020 09:12 AM    Wt Readings from Last 3 Encounters:  04/10/24 148 lb 12.8 oz (67.5 kg)  01/19/24 145 lb 12.8 oz (66.1 kg)  12/26/23 143 lb (64.9 kg)     Objective:    Vital Signs:  BP (!) 146/56   Pulse 75   Ht 5' 1 (1.549 m)   Wt 148 lb 12.8 oz (67.5 kg)   SpO2 97%   BMI 28.12 kg/m    GEN: Well nourished, well developed in no acute distress HEENT: Normal NECK: No JVD; No carotid bruits LYMPHATICS: No lymphadenopathy CARDIAC:RRR, no , rubs, gallops. 2/6 diastolic murmur at LUSB RESPIRATORY:  Clear to auscultation without rales, wheezing or rhonchi  ABDOMEN: Soft, non-tender, non-distended MUSCULOSKELETAL:  No edema; No deformity  SKIN: Warm and dry NEUROLOGIC:  Alert and oriented x 3 PSYCHIATRIC:  Normal affect  ASSESSMENT & PLAN:    Nonischemic DCM  - cardiac cath 04/2018 showed normal coornary arteries.  - Cardiac MRI with no evidence of infiltrative disease or infarct.  EF was 40%.   - 2D echo 09/2019 with EF 45-50% - 2D echo 10/2022 with EF 45-50% - 2D echo 12/09/23 with EF 35-40% and 39% on cMRI  03/2024 - He appears euvolemic on exam today - Continue Toprol -XL 200 mg daily and Entresto  24-26 mg twice daily with as  needed refills - I have personally reviewed and interpreted outside labs performed by patient's PCP which showed serum creatinine 1.2 and potassium 4.6 on 12/21/2023  AV disease  -s/p endocarditis complicated by AI s/p Ross procedure with  AVR autograft with Pulmonic tissue valve 2001.   -2D echo 09/2019 showed stable PV in the aortic position (mean gradient ) with mild AR -2D echo 10/2022 with moderate AR (PHT ), mean AVG , Vmax 1.49m/s and mean PVG .  -2D echo 12/09/2023 with severe eccentric AI with PHT 251 ms - Recent TEE 12/26/2023 demonstrated EF 35 to 40% with global HK, mild  to moderate MR and moderate to severe eccentric aortic insufficiency with 2 DPCA 0.6 cm, 3D VCA 0.4 cm and PHT 250 ms -Cardiac MRI 04/03/2024 demonstrated EF 39%, mild RV dysfunction RVEF 42%, status post Ross procedure with aortic valve homograft present with severe eccentric aortic insufficiency with regurgitant fraction 38% and mild PI of the pulmonary homograft -I have discussed his findings with structural heart who recommended a right and left heart cath and then referral to CVTS -I will set him up for right and left heart cath with Dr. Garrel Fell and then with CVTS after his procedure -Informed Consent   Shared Decision Making/Informed Consent The risks [stroke (1 in 1000), death (1 in 1000), kidney failure [usually temporary] (1 in 500), bleeding (1 in 200), allergic reaction [possibly serious] (1 in 200)], benefits (diagnostic support and management of coronary artery disease) and alternatives of a cardiac catheterization were discussed in detail with Mr. Mitchum and he is willing to proceed.    Descending aortic aneurysm  -s/p aortic resection with replacement with Hemashield graft in 2001.   -Cardiac MRI 09/2018 showed 40mm aortic root dilatation.   -2D echo 09/2019 with stable 40mm aortic root -2D echo 10/2022 with 39mm Asc Ao and 36mm AoR -2D echo 12/14/2023 ascending aorta 39 mm and aortic  root 3.2 mm  Hypertension  -BP borderline elevated at 146/56 mmHg today -Continue Toprol -XL 200 mg daily and Entresto  24-26 mg twice daily with as needed refills  Combined systolic/diastolic CHF  -He appears euvolemic on exam today -Recent cardiac MRI showed LVEF 39% and RVEF 42% with nonischemic cardiomyopathy based on delayed enhancement only RV insertion sites which is a nonspecific finding and no evidence of infarction inflammation or infiltrative disorder -Continue Toprol  XL 200 mg daily with as needed refills -Entresto  24-26 mg twice daily with as needed refills -No MRA due to history of hyperkalemia on Spiro in the past   Medication Adjustments/Labs and Tests Ordered: Current medicines are reviewed at length with the patient today.  Concerns regarding medicines are outlined above.  Tests Ordered: Orders Placed This Encounter  Procedures   EKG 12-Lead   Medication Changes: Meds ordered this encounter  Medications   sacubitril -valsartan  (ENTRESTO ) 24-26 MG    Sig: Take 1 tablet by mouth 2 (two) times daily.    Dispense:  180 tablet    Refill:  3    Disposition:  Follow up 3 months  Signed, [name], MD  04/10/2024 1:35 PM    [facility] "

## 2024-04-10 NOTE — H&P (View-Only) (Signed)
 "   Date:  04/10/2024   ID:  [name], DOB [birth year], MRN [id number]   PCP:  Dale Sanford.[name], MD (Inactive)  Cardiologist:  [name], MD  Electrophysiologist:  None   Chief Complaint:  CHF, HTN, AVR, DCM  History of Present Illness:    Dale Sanford is a 74 y.o. 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  2D echocardiogram 12/01/2015 showed mildly reduced LV systolic function with EF 40 to 45% with diffuse hypokinesis and grade 2 diastolic dysfunction.  He had a stable aortic valve bioprosthesis at that time and aortic root was mildly dilated at 39 mm.  Nuclear stress test for preoperative cardiac clearance showed an EF of 44% with no ischemia.   Echo 05/01/2018 showed that LVEF decreased from 40-45% to 20-25% with moderate MR and moderate aortic regurgitation.  His Losartan  was stopped and started Entresto  and spironolactone  and underwent right and LHC which showed minimal luminal irregularities with no evidence of obstructive coronary artery disease. There was moderately to severely reduced LV systolic function with an EF of 30 to 35% with global hypokinesis.  Right heart catheterization showed mildly elevated filling pressures, mild pulmonary hypertension and mildly reduced cardiac output.  RA pressure: 7 mmHg, RV pressure 39/3 mmHg, pulmonary capillary wedge pressure: 15 mmHg, PA pressure: 39/11 with a mean of 24 mmHg.  Cardiac output was 3.45 with an index of 2.16. He was also ordered to get nuclear imaging to assess for TTR amyloid but study on 05/31/18 was equivocal. He underwent cardiac MRI which which showed EF 40% with no evidence of cardiac amyloid and no late Gad enhancement to suggest infarct or infiltrative process.  Aortic root was dilated at 40mm.  His Entresto  and Spiro were stopped due to [facility] and hyperkalemia.  He is now back on Entresto   The patient is  here today and is doing well.  He has noticed recently that he gets tired with activities he used to be able to do.  He also has had some problems with DOE which is new. He denies any CP or pressure, PND, orthopnea, LE edema, dizziness, palpitations or syncope.    Prior CV studies:   The following studies were reviewed today:  2D echo 09/2019 IMPRESSIONS    1. Difficult acoustic windows limit study . There is mild inferior,  inferoseptal hypokinesis. . Left ventricular ejection fraction, by  estimation, is 45 to 50%. The left ventricle has mildly decreased  function. Left ventricular diastolic parameters are  consistent with Grade I diastolic dysfunction (impaired relaxation).   2. Right ventricular systolic function is normal. The right ventricular  size is normal. There is normal pulmonary artery systolic pressure.   3. Left atrial size was mildly dilated.   4. The mitral valve is normal in structure. Mild mitral valve  regurgitation.   5. A native pulmonic valve is present in aortic position. Opens well. .  The aortic valve has been repaired/replaced. Aortic valve regurgitation is  mild.   6. Pulmonic valve allograft present Opens well. Normal gradients.   7. S/p Ross procedure with ascending aorta replacement . Aortic  root/ascending aorta has been repaired/replaced. There is mild dilatation  of the ascending aorta measuring 40 mm.   8. The inferior vena cava is normal in size with greater than 50%  respiratory variability, suggesting right atrial pressure of 3 mmHg.  Cardiac MRI 09/2018 FINDINGS: 1. Normal left ventricular size. Mildly reduced systolic function, global hypokinesis. Normal wall thickness.   There is no late gadolinium enhancement in the left ventricular myocardium.   Normal T1 myocardial nulling kinetics, suggests against a diagnosis of cardiac amyloidosis.   2. Normal right ventricular size, thickness. Mildly reduced systolic function. There are no  regional wall motion abnormalities.   3.  Moderate left atrial enlargement.  Mid right atrial enlargement.   4. Sinuses 36 x 37 x 37 mm in short axis of the aortic valve. In the sagittal oblique candy cane aorta view, aortic root measure approximately 40 mm in a transverse, non-double oblique measurement. Mid ascending aorta measures 30 mm in transverse measurement. Arch 26 mm, proximal descending aorta 24 mm, mid descending 23 mm.   5. Aortic and mitral regurgitation. Aortic regurgitation appears moderate. Mitral valve regurgitation is not well assessed.   6.  Normal pericardium.  No pericardial effusion.   Extracardiac findings:   Probable hepatic cyst 26 mm. Compared to CT Abdomen pelvis report 04/28/2015, this is likely a stable finding.   IMPRESSION: 1. Normal T1 myocardial nulling kinetics, suggests against a diagnosis of cardiac amyloidosis.   2. There is no late gadolinium enhancement in the left ventricular myocardium. No evidence of infarction, infiltrative process, or cardiomyopathic process.   3. Mildly dilated aortic root. Measured in a transverse plane, the aortic root measures 40 mm. This measurement correlates with echocardiographic measurements.  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  Cardiac MRI with EF 39% 11/2023.   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/1999   infectious   RIGHT/LEFT HEART CATH AND CORONARY ANGIOGRAPHY N/A 05/11/2018   Procedure: RIGHT/LEFT HEART CATH AND CORONARY ANGIOGRAPHY;  Surgeon: Darron Deatrice LABOR, MD;  Location: [facility] INVASIVE CV LAB;  Service: Cardiovascular;  Laterality: N/A;   TOTAL HIP ARTHROPLASTY Right 02/18/2016   Procedure: RIGHT TOTAL HIP ARTHROPLASTY ANTERIOR APPROACH;  Surgeon: Redell Shoals, MD;  Location: [facility] ORS;  Service: Orthopedics;  Laterality: Right;   TRANSESOPHAGEAL ECHOCARDIOGRAM (CATH LAB) N/A 12/26/2023   Procedure: TRANSESOPHAGEAL ECHOCARDIOGRAM;  Surgeon: Kate Lonni CROME, MD;  Location: [facility] INVASIVE CV LAB;  Service: Cardiovascular;  Laterality: N/A;     Current Meds  Medication Sig   aspirin  EC 81 MG tablet Take 81 mg by mouth daily.   loperamide  (IMODIUM ) 2 MG capsule Take 1 capsule (2 mg total) by mouth every 8 (eight) hours as needed for diarrhea or loose stools.   metoprolol  (TOPROL -XL) 200 MG 24 hr tablet TAKE 1 TABLET BY MOUTH EVERY DAY   [DISCONTINUED] sacubitril -valsartan  (ENTRESTO ) 24-26 MG Take 1 tablet by mouth 2 (two) times daily.     Allergies:   Nyquil multi-symptom [pseudoeph-doxylamine-dm-apap]   Social History   Tobacco Use  Smoking status: Never   Smokeless tobacco: Never  Vaping Use   Vaping status: Never Used  Substance Use Topics   Alcohol  use: No    Comment: quit in 2008   Drug use: No     Family Hx: The patient's family history includes CAD in his father; Cancer in his brother; Cirrhosis in his brother.  ROS:   Please see the history of present illness.     All other systems reviewed and are negative.   Labs/Other Tests and Data Reviewed:    EKG Interpretation Date/Time:  Wednesday April 10 2024 13:04:38 EST Ventricular Rate:  75 PR Interval:  232 QRS Duration:  112 QT Interval:  414 QTC Calculation: 462 R Axis:   -29  Text Interpretation: Sinus  rhythm with 1st degree A-V block Incomplete left bundle branch block Left ventricular hypertrophy with repolarization abnormality ( R in aVL , Sokolow-Lyon , Cornell product , Romhilt-Estes ) When compared with ECG of 16-Oct-2023 18:36, T wave inversion now evident in Inferior leads T wave inversion more evident in Anterolateral leads Confirmed by Shlomo Corning ([id number]) on 04/10/2024 1:08:06 PM    Recent Labs: 10/16/2023: ALT 22 10/19/2023: Magnesium  1.7 12/21/2023: BUN 22; Creatinine, Ser 1.20; Hemoglobin 14.2; Platelets 211; Potassium 4.6; Sodium 139   Recent Lipid Panel Lab Results  Component Value Date/Time   CHOL 126 04/20/2020 09:12 AM   TRIG 66 04/20/2020 09:12 AM   HDL 38 (L) 04/20/2020 09:12 AM   CHOLHDL 3.3 04/20/2020 09:12 AM   LDLCALC 74 04/20/2020 09:12 AM    Wt Readings from Last 3 Encounters:  04/10/24 148 lb 12.8 oz (67.5 kg)  01/19/24 145 lb 12.8 oz (66.1 kg)  12/26/23 143 lb (64.9 kg)     Objective:    Vital Signs:  BP (!) 146/56   Pulse 75   Ht 5' 1 (1.549 m)   Wt 148 lb 12.8 oz (67.5 kg)   SpO2 97%   BMI 28.12 kg/m    GEN: Well nourished, well developed in no acute distress HEENT: Normal NECK: No JVD; No carotid bruits LYMPHATICS: No lymphadenopathy CARDIAC:RRR, no , rubs, gallops. 2/6 diastolic murmur at LUSB RESPIRATORY:  Clear to auscultation without rales, wheezing or rhonchi  ABDOMEN: Soft, non-tender, non-distended MUSCULOSKELETAL:  No edema; No deformity  SKIN: Warm and dry NEUROLOGIC:  Alert and oriented x 3 PSYCHIATRIC:  Normal affect  ASSESSMENT & PLAN:    Nonischemic DCM  - cardiac cath 04/2018 showed normal coornary arteries.  - Cardiac MRI with no evidence of infiltrative disease or infarct.  EF was 40%.   - 2D echo 09/2019 with EF 45-50% - 2D echo 10/2022 with EF 45-50% - 2D echo 12/09/23 with EF 35-40% and 39% on cMRI  03/2024 - He appears euvolemic on exam today - Continue Toprol -XL 200 mg daily and Entresto  24-26 mg twice daily with as  needed refills - I have personally reviewed and interpreted outside labs performed by patient's PCP which showed serum creatinine 1.2 and potassium 4.6 on 12/21/2023  AV disease  -s/p endocarditis complicated by AI s/p Ross procedure with  AVR autograft with Pulmonic tissue valve 2001.   -2D echo 09/2019 showed stable PV in the aortic position (mean gradient ) with mild AR -2D echo 10/2022 with moderate AR (PHT ), mean AVG , Vmax 1.49m/s and mean PVG .  -2D echo 12/09/2023 with severe eccentric AI with PHT 251 ms - Recent TEE 12/26/2023 demonstrated EF 35 to 40% with global HK, mild  to moderate MR and moderate to severe eccentric aortic insufficiency with 2 DPCA 0.6 cm, 3D VCA 0.4 cm and PHT 250 ms -Cardiac MRI 04/03/2024 demonstrated EF 39%, mild RV dysfunction RVEF 42%, status post Ross procedure with aortic valve homograft present with severe eccentric aortic insufficiency with regurgitant fraction 38% and mild PI of the pulmonary homograft -I have discussed his findings with structural heart who recommended a right and left heart cath and then referral to CVTS -I will set him up for right and left heart cath with Dr. Garrel Fell and then with CVTS after his procedure -Informed Consent   Shared Decision Making/Informed Consent The risks [stroke (1 in 1000), death (1 in 1000), kidney failure [usually temporary] (1 in 500), bleeding (1 in 200), allergic reaction [possibly serious] (1 in 200)], benefits (diagnostic support and management of coronary artery disease) and alternatives of a cardiac catheterization were discussed in detail with Mr. Mitchum and he is willing to proceed.    Descending aortic aneurysm  -s/p aortic resection with replacement with Hemashield graft in 2001.   -Cardiac MRI 09/2018 showed 40mm aortic root dilatation.   -2D echo 09/2019 with stable 40mm aortic root -2D echo 10/2022 with 39mm Asc Ao and 36mm AoR -2D echo 12/14/2023 ascending aorta 39 mm and aortic  root 3.2 mm  Hypertension  -BP borderline elevated at 146/56 mmHg today -Continue Toprol -XL 200 mg daily and Entresto  24-26 mg twice daily with as needed refills  Combined systolic/diastolic CHF  -He appears euvolemic on exam today -Recent cardiac MRI showed LVEF 39% and RVEF 42% with nonischemic cardiomyopathy based on delayed enhancement only RV insertion sites which is a nonspecific finding and no evidence of infarction inflammation or infiltrative disorder -Continue Toprol  XL 200 mg daily with as needed refills -Entresto  24-26 mg twice daily with as needed refills -No MRA due to history of hyperkalemia on Spiro in the past   Medication Adjustments/Labs and Tests Ordered: Current medicines are reviewed at length with the patient today.  Concerns regarding medicines are outlined above.  Tests Ordered: Orders Placed This Encounter  Procedures   EKG 12-Lead   Medication Changes: Meds ordered this encounter  Medications   sacubitril -valsartan  (ENTRESTO ) 24-26 MG    Sig: Take 1 tablet by mouth 2 (two) times daily.    Dispense:  180 tablet    Refill:  3    Disposition:  Follow up 3 months  Signed, [name], MD  04/10/2024 1:35 PM    [facility] "

## 2024-04-11 ENCOUNTER — Ambulatory Visit: Payer: Self-pay | Admitting: Cardiology

## 2024-04-11 ENCOUNTER — Encounter: Payer: Self-pay | Admitting: *Deleted

## 2024-04-11 DIAGNOSIS — I5042 Chronic combined systolic (congestive) and diastolic (congestive) heart failure: Secondary | ICD-10-CM

## 2024-04-11 DIAGNOSIS — G4733 Obstructive sleep apnea (adult) (pediatric): Secondary | ICD-10-CM

## 2024-04-11 DIAGNOSIS — I10 Essential (primary) hypertension: Secondary | ICD-10-CM

## 2024-04-11 LAB — BASIC METABOLIC PANEL WITH GFR
BUN/Creatinine Ratio: 19 (ref 10–24)
BUN: 21 mg/dL (ref 8–27)
CO2: 21 mmol/L (ref 20–29)
Calcium: 9.2 mg/dL (ref 8.6–10.2)
Chloride: 105 mmol/L (ref 96–106)
Creatinine, Ser: 1.1 mg/dL (ref 0.76–1.27)
Glucose: 86 mg/dL (ref 70–99)
Potassium: 4.9 mmol/L (ref 3.5–5.2)
Sodium: 145 mmol/L — AB (ref 134–144)
eGFR: 70 mL/min/1.73

## 2024-04-11 LAB — CBC
Hematocrit: 44.7 % (ref 37.5–51.0)
Hemoglobin: 15 g/dL (ref 13.0–17.7)
MCH: 29.2 pg (ref 26.6–33.0)
MCHC: 33.6 g/dL (ref 31.5–35.7)
MCV: 87 fL (ref 79–97)
Platelets: 232 x10E3/uL (ref 150–450)
RBC: 5.13 x10E6/uL (ref 4.14–5.80)
RDW: 13.4 % (ref 11.6–15.4)
WBC: 10.8 x10E3/uL (ref 3.4–10.8)

## 2024-04-11 NOTE — Telephone Encounter (Signed)
 This encounter was created in error - please disregard.

## 2024-04-11 NOTE — Telephone Encounter (Signed)
-----   Message from [name], MD sent at 04/10/2024  1:12 PM EST ----- Order new PAP supplies and mask of choice

## 2024-04-22 ENCOUNTER — Telehealth: Payer: Self-pay | Admitting: *Deleted

## 2024-04-22 NOTE — Telephone Encounter (Addendum)
 Cardiac Catheterization scheduled at [facility] for: Tuesday April 23, 2024 9 AM Arrival time [facility] Main Entrance A at: 7 AM  Diet: -Nothing to eat after midnight.  Hydration: -May drink clear liquids until 2 hours before the procedure.  Approved liquids: Water , clear tea, black coffee, fruit juices-non-citric and without pulp,Gatorade, plain Jello/popsicles.   -Please drink 8 oz of water  2 hours before procedure.  Medication instructions: -Usual morning medications can be taken including aspirin  81 mg.  Plan to go home the same day, you will only stay overnight if medically necessary.  You must have responsible adult to drive you home.  Someone must be with you the first 24 hours after you arrive home.  Reviewed procedure instructions with patient

## 2024-04-23 ENCOUNTER — Ambulatory Visit (HOSPITAL_COMMUNITY)
Admission: RE | Admit: 2024-04-23 | Discharge: 2024-04-23 | Disposition: A | Discharge status: Home / Self Care | Attending: Cardiovascular Disease | Admitting: Cardiovascular Disease

## 2024-04-23 ENCOUNTER — Encounter (HOSPITAL_COMMUNITY)
Admission: RE | Disposition: A | Discharge status: Home / Self Care | Payer: Self-pay | Source: Home / Self Care | Attending: Cardiovascular Disease

## 2024-04-23 ENCOUNTER — Encounter (HOSPITAL_COMMUNITY): Payer: Self-pay | Admitting: Cardiovascular Disease

## 2024-04-23 ENCOUNTER — Other Ambulatory Visit: Payer: Self-pay

## 2024-04-23 DIAGNOSIS — Z7982 Long term (current) use of aspirin: Secondary | ICD-10-CM | POA: Insufficient documentation

## 2024-04-23 DIAGNOSIS — I351 Nonrheumatic aortic (valve) insufficiency: Secondary | ICD-10-CM | POA: Insufficient documentation

## 2024-04-23 DIAGNOSIS — I420 Dilated cardiomyopathy: Secondary | ICD-10-CM | POA: Insufficient documentation

## 2024-04-23 DIAGNOSIS — I2510 Atherosclerotic heart disease of native coronary artery without angina pectoris: Secondary | ICD-10-CM | POA: Insufficient documentation

## 2024-04-23 DIAGNOSIS — Z79899 Other long term (current) drug therapy: Secondary | ICD-10-CM | POA: Insufficient documentation

## 2024-04-23 DIAGNOSIS — I719 Aortic aneurysm of unspecified site, without rupture: Secondary | ICD-10-CM | POA: Insufficient documentation

## 2024-04-23 DIAGNOSIS — I5042 Chronic combined systolic (congestive) and diastolic (congestive) heart failure: Secondary | ICD-10-CM | POA: Insufficient documentation

## 2024-04-23 DIAGNOSIS — I110 Hypertensive heart disease with heart failure: Secondary | ICD-10-CM | POA: Insufficient documentation

## 2024-04-23 DIAGNOSIS — Z953 Presence of xenogenic heart valve: Secondary | ICD-10-CM | POA: Insufficient documentation

## 2024-04-23 LAB — POCT I-STAT EG7
Acid-base deficit: 1 mmol/L (ref 0.0–2.0)
Bicarbonate: 24.3 mmol/L (ref 20.0–28.0)
Calcium, Ion: 1.2 mmol/L (ref 1.15–1.40)
HCT: 40 % (ref 39.0–52.0)
Hemoglobin: 13.6 g/dL (ref 13.0–17.0)
O2 Saturation: 65 %
Potassium: 4.5 mmol/L (ref 3.5–5.1)
Sodium: 141 mmol/L (ref 135–145)
TCO2: 26 mmol/L (ref 22–32)
pCO2, Ven: 41.5 mmHg — ABNORMAL LOW (ref 44–60)
pH, Ven: 7.375 (ref 7.25–7.43)
pO2, Ven: 34 mmHg (ref 32–45)

## 2024-04-23 LAB — POCT I-STAT 7, (LYTES, BLD GAS, ICA,H+H)
Acid-base deficit: 1 mmol/L (ref 0.0–2.0)
Bicarbonate: 23 mmol/L (ref 20.0–28.0)
Calcium, Ion: 1.17 mmol/L (ref 1.15–1.40)
HCT: 39 % (ref 39.0–52.0)
Hemoglobin: 13.3 g/dL (ref 13.0–17.0)
O2 Saturation: 96 %
Potassium: 4.4 mmol/L (ref 3.5–5.1)
Sodium: 141 mmol/L (ref 135–145)
TCO2: 24 mmol/L (ref 22–32)
pCO2 arterial: 36.9 mmHg (ref 32–48)
pH, Arterial: 7.403 (ref 7.35–7.45)
pO2, Arterial: 79 mmHg — ABNORMAL LOW (ref 83–108)

## 2024-04-23 MED ORDER — LABETALOL HCL 5 MG/ML IV SOLN: 10.0000 mg | INTRAVENOUS | Status: DC | PRN

## 2024-04-23 MED ORDER — HEPARIN SODIUM (PORCINE) 1000 UNIT/ML IJ SOLN
INTRAMUSCULAR | Status: AC
  Filled 2024-04-23: qty 10

## 2024-04-23 MED ORDER — FREE WATER: 500.0000 mL | Freq: Once | Status: DC

## 2024-04-23 MED ORDER — MIDAZOLAM HCL (PF) 2 MG/2ML IJ SOLN
INTRAMUSCULAR | Status: DC | PRN
  Administered 2024-04-23: 2 mg via INTRAVENOUS

## 2024-04-23 MED ORDER — LIDOCAINE HCL (PF) 1 % IJ SOLN
INTRAMUSCULAR | Status: DC | PRN
  Administered 2024-04-23: 5 mL via INTRADERMAL

## 2024-04-23 MED ORDER — SODIUM CHLORIDE 0.9 % IV SOLN: 250.0000 mL | INTRAVENOUS | Status: DC | PRN

## 2024-04-23 MED ORDER — HEPARIN (PORCINE) IN NACL 1000-0.9 UT/500ML-% IV SOLN
INTRAVENOUS | Status: DC | PRN
  Administered 2024-04-23: 1000 mL

## 2024-04-23 MED ORDER — FREE WATER: 250.0000 mL | Freq: Once | Status: DC

## 2024-04-23 MED ORDER — FENTANYL CITRATE (PF) 100 MCG/2ML IJ SOLN
INTRAMUSCULAR | Status: AC
  Filled 2024-04-23: qty 2

## 2024-04-23 MED ORDER — HEPARIN SODIUM (PORCINE) 1000 UNIT/ML IJ SOLN
INTRAMUSCULAR | Status: DC | PRN
  Administered 2024-04-23: 4000 [IU] via INTRAVENOUS

## 2024-04-23 MED ORDER — VERAPAMIL HCL 2.5 MG/ML IV SOLN
INTRAVENOUS | Status: DC | PRN
  Administered 2024-04-23: 10 mL via INTRA_ARTERIAL

## 2024-04-23 MED ORDER — FENTANYL CITRATE (PF) 100 MCG/2ML IJ SOLN
INTRAMUSCULAR | Status: DC | PRN
  Administered 2024-04-23: 25 ug via INTRAVENOUS

## 2024-04-23 MED ORDER — SODIUM CHLORIDE 0.9% FLUSH: 3.0000 mL | INTRAVENOUS | Status: DC | PRN

## 2024-04-23 MED ORDER — SODIUM CHLORIDE 0.9% FLUSH: 3.0000 mL | Freq: Two times a day (BID) | INTRAVENOUS | Status: DC

## 2024-04-23 MED ORDER — MIDAZOLAM HCL 2 MG/2ML IJ SOLN
INTRAMUSCULAR | Status: AC
  Filled 2024-04-23: qty 2

## 2024-04-23 MED ORDER — ASPIRIN 81 MG PO CHEW: 81.0000 mg | CHEWABLE_TABLET | ORAL | Status: DC

## 2024-04-23 MED ORDER — HYDRALAZINE HCL 20 MG/ML IJ SOLN: 10.0000 mg | INTRAMUSCULAR | Status: DC | PRN

## 2024-04-23 MED ORDER — VERAPAMIL HCL 2.5 MG/ML IV SOLN
INTRAVENOUS | Status: AC
  Filled 2024-04-23: qty 2

## 2024-04-23 MED ORDER — IOHEXOL 350 MG/ML SOLN
INTRAVENOUS | Status: DC | PRN
  Administered 2024-04-23: 75 mL

## 2024-04-23 MED ORDER — LIDOCAINE HCL (PF) 1 % IJ SOLN
INTRAMUSCULAR | Status: AC
  Filled 2024-04-23: qty 30

## 2024-04-23 MED ORDER — ONDANSETRON HCL 4 MG/2ML IJ SOLN: 4.0000 mg | Freq: Four times a day (QID) | INTRAMUSCULAR | Status: DC | PRN

## 2024-04-23 NOTE — Progress Notes (Signed)
 Pt and friend received discharge instructions, teach back performed. Iv removed, no complications. Rt radial site is clean dry intact, site is soft, no signs of bleeding. Pt escorted out via wheelchair to friend's vehicle.

## 2024-04-23 NOTE — Interval H&P Note (Signed)
 History and Physical Interval Note:  04/23/2024 10:15 AM  Dale Sanford Search  has presented today for surgery, with the diagnosis of aortic insufficiency.  The various methods of treatment have been discussed with the patient and family. After consideration of risks, benefits and other options for treatment, the patient has consented to  Procedures: RIGHT/LEFT HEART CATH AND CORONARY ANGIOGRAPHY (N/A) as a surgical intervention.  The patient's history has been reviewed, patient examined, no change in status, stable for surgery.  I have reviewed the patient's chart and labs.  Questions were answered to the patient's satisfaction.     [name]

## 2024-04-23 NOTE — Discharge Instructions (Signed)

## 2024-05-08 ENCOUNTER — Encounter: Admitting: Surgery

## 2024-05-29 ENCOUNTER — Ambulatory Visit: Admitting: Cardiology

## 2024-06-05 ENCOUNTER — Ambulatory Visit: Payer: Self-pay | Admitting: Cardiology

## 2024-08-06 ENCOUNTER — Ambulatory Visit: Admitting: Cardiology
